# Patient Record
Sex: Female | Born: 1945 | Race: White | Hispanic: No | Smoking: Current every day smoker
Health system: Southern US, Community
[De-identification: ages and names within clinical notes are randomized; demographics above are authoritative.]

## PROBLEM LIST (undated history)

## (undated) DIAGNOSIS — I495 Sick sinus syndrome: Secondary | ICD-10-CM

## (undated) DIAGNOSIS — J449 Chronic obstructive pulmonary disease, unspecified: Secondary | ICD-10-CM

## (undated) DIAGNOSIS — G709 Myoneural disorder, unspecified: Secondary | ICD-10-CM

## (undated) DIAGNOSIS — K219 Gastro-esophageal reflux disease without esophagitis: Secondary | ICD-10-CM

## (undated) DIAGNOSIS — M5136 Other intervertebral disc degeneration, lumbar region: Secondary | ICD-10-CM

## (undated) DIAGNOSIS — Z720 Tobacco use: Secondary | ICD-10-CM

## (undated) DIAGNOSIS — E119 Type 2 diabetes mellitus without complications: Secondary | ICD-10-CM

## (undated) DIAGNOSIS — E785 Hyperlipidemia, unspecified: Secondary | ICD-10-CM

## (undated) DIAGNOSIS — Z9981 Dependence on supplemental oxygen: Secondary | ICD-10-CM

## (undated) DIAGNOSIS — G4730 Sleep apnea, unspecified: Secondary | ICD-10-CM

## (undated) DIAGNOSIS — I739 Peripheral vascular disease, unspecified: Secondary | ICD-10-CM

## (undated) DIAGNOSIS — Z950 Presence of cardiac pacemaker: Secondary | ICD-10-CM

## (undated) DIAGNOSIS — M51369 Other intervertebral disc degeneration, lumbar region without mention of lumbar back pain or lower extremity pain: Secondary | ICD-10-CM

## (undated) HISTORY — PX: TUBAL LIGATION: SHX77

## (undated) HISTORY — DX: Tobacco use: Z72.0

## (undated) HISTORY — PX: TONSILLECTOMY: SUR1361

## (undated) HISTORY — DX: Type 2 diabetes mellitus without complications: E11.9

## (undated) HISTORY — DX: Presence of cardiac pacemaker: Z95.0

## (undated) HISTORY — DX: Sick sinus syndrome: I49.5

## (undated) HISTORY — PX: CHOLECYSTECTOMY: SHX55

## (undated) HISTORY — PX: ABDOMINAL HYSTERECTOMY: SHX81

## (undated) HISTORY — DX: Hyperlipidemia, unspecified: E78.5

## (undated) HISTORY — PX: INSERT / REPLACE / REMOVE PACEMAKER: SUR710

---

## 2000-11-27 ENCOUNTER — Ambulatory Visit (HOSPITAL_COMMUNITY): Admission: RE | Admit: 2000-11-27 | Discharge: 2000-11-27 | Payer: Self-pay | Admitting: Family Medicine

## 2000-11-27 ENCOUNTER — Encounter: Payer: Self-pay | Admitting: Family Medicine

## 2001-09-04 ENCOUNTER — Encounter: Payer: Self-pay | Admitting: Family Medicine

## 2001-09-04 ENCOUNTER — Ambulatory Visit (HOSPITAL_COMMUNITY): Admission: RE | Admit: 2001-09-04 | Discharge: 2001-09-04 | Payer: Self-pay | Admitting: Family Medicine

## 2002-03-13 ENCOUNTER — Encounter: Payer: Self-pay | Admitting: Family Medicine

## 2002-03-13 ENCOUNTER — Ambulatory Visit (HOSPITAL_COMMUNITY): Admission: RE | Admit: 2002-03-13 | Discharge: 2002-03-13 | Payer: Self-pay | Admitting: Family Medicine

## 2002-04-12 ENCOUNTER — Ambulatory Visit (HOSPITAL_COMMUNITY): Admission: RE | Admit: 2002-04-12 | Discharge: 2002-04-12 | Payer: Self-pay | Admitting: Internal Medicine

## 2002-04-13 HISTORY — PX: COLONOSCOPY: SHX174

## 2002-04-13 HISTORY — PX: ESOPHAGOGASTRODUODENOSCOPY: SHX1529

## 2002-04-16 ENCOUNTER — Encounter (HOSPITAL_COMMUNITY): Admission: RE | Admit: 2002-04-16 | Discharge: 2002-05-16 | Payer: Self-pay | Admitting: *Deleted

## 2002-04-16 ENCOUNTER — Encounter: Payer: Self-pay | Admitting: *Deleted

## 2003-02-10 ENCOUNTER — Ambulatory Visit (HOSPITAL_COMMUNITY): Admission: RE | Admit: 2003-02-10 | Discharge: 2003-02-10 | Payer: Self-pay | Admitting: Family Medicine

## 2003-02-24 ENCOUNTER — Ambulatory Visit: Admission: RE | Admit: 2003-02-24 | Discharge: 2003-02-24 | Payer: Self-pay | Admitting: Family Medicine

## 2004-10-01 ENCOUNTER — Ambulatory Visit (HOSPITAL_COMMUNITY): Admission: RE | Admit: 2004-10-01 | Discharge: 2004-10-01 | Payer: Self-pay | Admitting: Family Medicine

## 2005-03-22 ENCOUNTER — Ambulatory Visit (HOSPITAL_COMMUNITY): Admission: RE | Admit: 2005-03-22 | Discharge: 2005-03-22 | Payer: Self-pay | Admitting: Pediatrics

## 2005-03-29 ENCOUNTER — Ambulatory Visit (HOSPITAL_COMMUNITY): Admission: RE | Admit: 2005-03-29 | Discharge: 2005-03-30 | Payer: Self-pay | Admitting: *Deleted

## 2006-01-17 ENCOUNTER — Ambulatory Visit (HOSPITAL_COMMUNITY): Admission: RE | Admit: 2006-01-17 | Discharge: 2006-01-17 | Payer: Self-pay | Admitting: Family Medicine

## 2007-06-15 ENCOUNTER — Ambulatory Visit (HOSPITAL_COMMUNITY): Admission: RE | Admit: 2007-06-15 | Discharge: 2007-06-15 | Payer: Self-pay | Admitting: Family Medicine

## 2007-08-01 ENCOUNTER — Ambulatory Visit (HOSPITAL_COMMUNITY): Admission: RE | Admit: 2007-08-01 | Discharge: 2007-08-01 | Payer: Self-pay | Admitting: Family Medicine

## 2007-11-20 ENCOUNTER — Ambulatory Visit (HOSPITAL_COMMUNITY): Admission: RE | Admit: 2007-11-20 | Discharge: 2007-11-20 | Payer: Self-pay | Admitting: Family Medicine

## 2008-08-07 ENCOUNTER — Emergency Department (HOSPITAL_COMMUNITY): Admission: EM | Admit: 2008-08-07 | Discharge: 2008-08-07 | Payer: Self-pay | Admitting: Emergency Medicine

## 2009-01-19 ENCOUNTER — Ambulatory Visit (HOSPITAL_COMMUNITY): Admission: RE | Admit: 2009-01-19 | Discharge: 2009-01-19 | Payer: Self-pay | Admitting: Family Medicine

## 2009-04-08 ENCOUNTER — Emergency Department (HOSPITAL_COMMUNITY): Admission: EM | Admit: 2009-04-08 | Discharge: 2009-04-08 | Payer: Self-pay | Admitting: Emergency Medicine

## 2009-04-17 ENCOUNTER — Ambulatory Visit (HOSPITAL_COMMUNITY): Admission: RE | Admit: 2009-04-17 | Discharge: 2009-04-17 | Payer: Self-pay | Admitting: Family Medicine

## 2009-04-27 ENCOUNTER — Ambulatory Visit (HOSPITAL_COMMUNITY): Admission: RE | Admit: 2009-04-27 | Discharge: 2009-04-27 | Payer: Self-pay | Admitting: Family Medicine

## 2009-04-28 ENCOUNTER — Ambulatory Visit (HOSPITAL_COMMUNITY): Admission: RE | Admit: 2009-04-28 | Discharge: 2009-04-28 | Payer: Self-pay | Admitting: Family Medicine

## 2009-04-29 ENCOUNTER — Ambulatory Visit (HOSPITAL_COMMUNITY): Admission: RE | Admit: 2009-04-29 | Discharge: 2009-04-29 | Payer: Self-pay | Admitting: Family Medicine

## 2009-05-06 ENCOUNTER — Encounter (HOSPITAL_COMMUNITY): Admission: RE | Admit: 2009-05-06 | Discharge: 2009-06-05 | Payer: Self-pay | Admitting: Family Medicine

## 2009-06-05 ENCOUNTER — Ambulatory Visit (HOSPITAL_COMMUNITY): Admission: RE | Admit: 2009-06-05 | Discharge: 2009-06-05 | Payer: Self-pay | Admitting: Neurological Surgery

## 2009-11-01 ENCOUNTER — Emergency Department (HOSPITAL_COMMUNITY): Admission: EM | Admit: 2009-11-01 | Discharge: 2009-11-01 | Payer: Self-pay | Admitting: Emergency Medicine

## 2010-04-25 ENCOUNTER — Encounter: Payer: Self-pay | Admitting: Internal Medicine

## 2010-05-20 HISTORY — PX: NM MYOCAR PERF WALL MOTION: HXRAD629

## 2010-05-20 HISTORY — PX: TRANSTHORACIC ECHOCARDIOGRAM: SHX275

## 2010-06-16 ENCOUNTER — Other Ambulatory Visit (HOSPITAL_COMMUNITY): Payer: Self-pay | Admitting: Family Medicine

## 2010-06-16 DIAGNOSIS — Z139 Encounter for screening, unspecified: Secondary | ICD-10-CM

## 2010-06-19 LAB — CBC
Hemoglobin: 14.1 g/dL (ref 12.0–15.0)
MCH: 32.8 pg (ref 26.0–34.0)
Platelets: 266 10*3/uL (ref 150–400)
RBC: 4.3 MIL/uL (ref 3.87–5.11)
WBC: 9.3 10*3/uL (ref 4.0–10.5)

## 2010-06-19 LAB — BASIC METABOLIC PANEL
CO2: 23 mEq/L (ref 19–32)
Calcium: 9.7 mg/dL (ref 8.4–10.5)
Creatinine, Ser: 0.88 mg/dL (ref 0.4–1.2)
GFR calc Af Amer: >60 mL/min (ref >60)
GFR calc non Af Amer: >60 mL/min (ref >60)
Sodium: 137 mEq/L (ref 135–145)

## 2010-06-19 LAB — DIFFERENTIAL
Basophils Relative: 1 % (ref 0–1)
Eosinophils Absolute: 0.2 10*3/uL (ref 0.0–0.7)
Lymphs Abs: 2.4 10*3/uL (ref 0.7–4.0)
Neutro Abs: 5.9 10*3/uL (ref 1.7–7.7)
Neutrophils Relative %: 64 % (ref 43–77)

## 2010-06-20 LAB — URINALYSIS, ROUTINE W REFLEX MICROSCOPIC
Bilirubin Urine: NEGATIVE
Glucose, UA: NEGATIVE mg/dL
Hgb urine dipstick: NEGATIVE
Ketones, ur: NEGATIVE mg/dL
Leukocytes, UA: NEGATIVE
Nitrite: POSITIVE — AB
Protein, ur: NEGATIVE mg/dL
Specific Gravity, Urine: 1.02 (ref 1.005–1.030)
Urobilinogen, UA: 0.2 mg/dL (ref 0.0–1.0)
pH: 5.5 (ref 5.0–8.0)

## 2010-06-20 LAB — URINE CULTURE: Colony Count: >=100000

## 2010-06-20 LAB — URINE MICROSCOPIC-ADD ON

## 2010-06-22 ENCOUNTER — Ambulatory Visit (HOSPITAL_COMMUNITY): Payer: Medicare Other

## 2010-06-27 LAB — GLUCOSE, CAPILLARY: Glucose-Capillary: 113 mg/dL — ABNORMAL HIGH (ref 70–99)

## 2010-07-13 ENCOUNTER — Inpatient Hospital Stay (HOSPITAL_COMMUNITY)
Admission: AD | Admit: 2010-07-13 | Discharge: 2010-07-16 | DRG: 690 | Disposition: A | Discharge status: Home / Self Care | Payer: Medicare Other | Source: Ambulatory Visit | Attending: Family Medicine | Admitting: Family Medicine

## 2010-07-13 ENCOUNTER — Emergency Department (HOSPITAL_COMMUNITY): Payer: Medicare Other

## 2010-07-13 DIAGNOSIS — G7109 Other specified muscular dystrophies: Secondary | ICD-10-CM | POA: Diagnosis present

## 2010-07-13 DIAGNOSIS — E860 Dehydration: Secondary | ICD-10-CM | POA: Diagnosis present

## 2010-07-13 DIAGNOSIS — R918 Other nonspecific abnormal finding of lung field: Secondary | ICD-10-CM | POA: Diagnosis present

## 2010-07-13 DIAGNOSIS — G8929 Other chronic pain: Secondary | ICD-10-CM | POA: Diagnosis present

## 2010-07-13 DIAGNOSIS — M545 Low back pain, unspecified: Secondary | ICD-10-CM | POA: Diagnosis present

## 2010-07-13 DIAGNOSIS — F05 Delirium due to known physiological condition: Secondary | ICD-10-CM | POA: Diagnosis present

## 2010-07-13 DIAGNOSIS — N390 Urinary tract infection, site not specified: Principal | ICD-10-CM | POA: Diagnosis present

## 2010-07-13 DIAGNOSIS — E875 Hyperkalemia: Secondary | ICD-10-CM | POA: Diagnosis present

## 2010-07-13 DIAGNOSIS — A498 Other bacterial infections of unspecified site: Secondary | ICD-10-CM | POA: Diagnosis present

## 2010-07-13 LAB — POCT CARDIAC MARKERS
CKMB, poc: 2.2 ng/mL (ref 1.0–8.0)
Myoglobin, poc: 112 ng/mL (ref 12–200)
Myoglobin, poc: 170 ng/mL (ref 12–200)

## 2010-07-13 LAB — CBC
HCT: 37.2 % (ref 36.0–46.0)
HCT: 44.3 % (ref 36.0–46.0)
Hemoglobin: 13.1 g/dL (ref 12.0–15.0)
Hemoglobin: 14.7 g/dL (ref 12.0–15.0)
MCHC: 33.2 g/dL (ref 30.0–36.0)
MCHC: 35.3 g/dL (ref 30.0–36.0)
MCV: 95.2 fL (ref 78.0–100.0)
RBC: 3.91 MIL/uL (ref 3.87–5.11)
RBC: 4.58 MIL/uL (ref 3.87–5.11)
WBC: 8.1 10*3/uL (ref 4.0–10.5)

## 2010-07-13 LAB — DIFFERENTIAL
Basophils Absolute: 0 10*3/uL (ref 0.0–0.1)
Basophils Relative: 1 % (ref 0–1)
Eosinophils Absolute: 0.3 10*3/uL (ref 0.0–0.7)
Eosinophils Relative: 4 % (ref 0–5)
Lymphocytes Relative: 13 % (ref 12–46)
Lymphs Abs: 3.6 10*3/uL (ref 0.7–4.0)
Monocytes Absolute: 0.5 10*3/uL (ref 0.1–1.0)
Monocytes Absolute: 0.7 10*3/uL (ref 0.1–1.0)
Monocytes Relative: 6 % (ref 3–12)
Monocytes Relative: 6 % (ref 3–12)
Neutro Abs: 9.2 10*3/uL — ABNORMAL HIGH (ref 1.7–7.7)
Neutrophils Relative %: 45 % (ref 43–77)
Neutrophils Relative %: 80 % — ABNORMAL HIGH (ref 43–77)

## 2010-07-13 LAB — URINALYSIS, ROUTINE W REFLEX MICROSCOPIC
Bilirubin Urine: NEGATIVE
Hgb urine dipstick: NEGATIVE
Nitrite: POSITIVE — AB
Urobilinogen, UA: 0.2 mg/dL (ref 0.0–1.0)

## 2010-07-13 LAB — COMPREHENSIVE METABOLIC PANEL
ALT: 55 U/L — ABNORMAL HIGH (ref 0–35)
AST: 55 U/L — ABNORMAL HIGH (ref 0–37)
CO2: 26 mEq/L (ref 19–32)
Calcium: 9.5 mg/dL (ref 8.4–10.5)
Creatinine, Ser: 1.14 mg/dL (ref 0.4–1.2)
GFR calc Af Amer: 58 mL/min — ABNORMAL LOW (ref >60)
GFR calc non Af Amer: 48 mL/min — ABNORMAL LOW (ref >60)
Sodium: 134 mEq/L — ABNORMAL LOW (ref 135–145)
Total Protein: 6.7 g/dL (ref 6.0–8.3)

## 2010-07-13 LAB — D-DIMER, QUANTITATIVE: D-Dimer, Quant: 0.47 ug/mL-FEU (ref 0.00–0.48)

## 2010-07-13 LAB — LIPASE, BLOOD: Lipase: 17 U/L (ref 11–59)

## 2010-07-13 LAB — BASIC METABOLIC PANEL
CO2: 25 mEq/L (ref 19–32)
Chloride: 106 mEq/L (ref 96–112)
Creatinine, Ser: 1.05 mg/dL (ref 0.4–1.2)
GFR calc Af Amer: >60 mL/min (ref >60)
Potassium: 3.8 mEq/L (ref 3.5–5.1)

## 2010-07-13 LAB — URINE MICROSCOPIC-ADD ON

## 2010-07-13 LAB — GLUCOSE, CAPILLARY: Glucose-Capillary: 141 mg/dL — ABNORMAL HIGH (ref 70–99)

## 2010-07-13 MED ORDER — IOHEXOL 300 MG/ML  SOLN
100.0000 mL | Freq: Once | INTRAMUSCULAR | Status: AC | PRN
  Administered 2010-07-13: 100 mL via INTRAVENOUS

## 2010-07-14 LAB — GLUCOSE, CAPILLARY
Glucose-Capillary: 165 mg/dL — ABNORMAL HIGH (ref 70–99)
Glucose-Capillary: 114 mg/dL — ABNORMAL HIGH (ref 70–99)
Glucose-Capillary: 104 mg/dL — ABNORMAL HIGH (ref 70–99)

## 2010-07-14 LAB — CBC
HCT: 47.5 % — ABNORMAL HIGH (ref 36.0–46.0)
Hemoglobin: 15.6 g/dL — ABNORMAL HIGH (ref 12.0–15.0)
RBC: 4.93 MIL/uL (ref 3.87–5.11)

## 2010-07-14 LAB — BASIC METABOLIC PANEL
CO2: 26 mEq/L (ref 19–32)
Chloride: 99 mEq/L (ref 96–112)
GFR calc Af Amer: >60 mL/min (ref >60)
Glucose, Bld: 117 mg/dL — ABNORMAL HIGH (ref 70–99)
Sodium: 135 mEq/L (ref 135–145)

## 2010-07-14 LAB — DIFFERENTIAL
Basophils Absolute: 0 10*3/uL (ref 0.0–0.1)
Basophils Relative: 0 % (ref 0–1)
Lymphocytes Relative: 16 % (ref 12–46)
Monocytes Absolute: 1.2 10*3/uL — ABNORMAL HIGH (ref 0.1–1.0)
Monocytes Relative: 12 % (ref 3–12)
Neutro Abs: 7.4 10*3/uL (ref 1.7–7.7)
Neutrophils Relative %: 72 % (ref 43–77)

## 2010-07-14 NOTE — H&P (Signed)
NAMEKRYSTL, WICKWARE                ACCOUNT NO.:  [id number]  MEDICAL RECORD NO.:  [id number]           PATIENT TYPE:  I  LOCATION:  A334                          FACILITY:  [facility]  PHYSICIAN:  Angeliki Mates L. Juanetta Gosling, M.D.DATE OF BIRTH:  11/11/1945  DATE OF ADMISSION:  07/13/2010 DATE OF DISCHARGE:  LH                             HISTORY & PHYSICAL   Ms. Vasudevan is a 65 year old patient of Dr. Michelle Nasuti.  She came to the emergency room because of nausea and vomiting.  She was also found to be hyperkalemic.  She says that she has been having trouble with the abdominal discomfort, nausea, and vomiting that started yesterday.  She also had some weakness and chills, vomited several times.  She has not had any diarrhea and in fact has not had any bowel movements at all. She says that it is in the right side of her abdomen.  It has not been radiating and it has been a 10/10.  She feels very weak.  She is not sure if she has had any fever or not. PAST MEDICAL HISTORY:  Positive for muscular dystrophy, diabetes.  She has a pacemaker.  She has GERD.  She has hyperlipidemia.  She has a history of hysterectomy in the past.  She has also had a cholecystectomy.  SOCIAL HISTORY:  She smokes about a pack of cigarettes daily.  She does not use any alcohol.  She does not use any illicit drugs.  She has no known drug allergies.  MEDICATIONS: 1. Percocet 5/325 four times a day as needed for pain. 2. Xanax 1 mg as needed for anxiety. 3. Aspirin. 4. Fish oil. 5. Metformin. 6. Omeprazole. 7. Simvastatin.  We do not have the doses of those.  FAMILY HISTORY:  Shows her mother died of non-Hodgkin lymphoma in her 40s.  Father had a neuroblastoma.  There is a significant history of diabetes and hypertension.  REVIEW OF SYSTEMS:  Except as mentioned is negative.  PHYSICAL EXAMINATION:  VITAL SIGNS:  Shows her blood pressure initially 98/64, pulse was 72, respirations 16, temperature is 97.5.  Later  after IV fluids, blood pressure 137/70, heart rate was 71, respirations 16. GENERAL:  She says she is feeling better.  She is not having nausea now. HEENT:  Shows that her mucous membranes are dry. NECK:  Supple without masses. CHEST:  Shows some rhonchi bilaterally. HEART:  Regular without murmur, gallop or rub. ABDOMEN:  Soft with minimal tenderness diffusely. EXTREMITIES:  Showed no edema. CNS:  Grossly intact.  LABORATORY WORK:  Blood sugar was 141.  Urinalysis is very positive with positive nitrites.  Cath urine shows many bacteria,7-10 white cells. Her white count 11,400, hemoglobin is 14.7, platelets of 187.  Her sodium is 134, potassium was 6.7, later 6.1, BUN of 20, creatinine 1.14. She has received Kayexalate.  She had a CT abdomen and pelvis that did not show anything acute, and no diverticulitis.  ASSESSMENT AND PLAN:  She has abdominal discomfort, perhaps related to her urinary tract infection.  She has had nausea and vomiting.  She has an elevated potassium.  I am not  sure of the cause of that.  Liver functions are slightly elevated as well.  Liver looked okay on her CT. My plan then is to give her IV fluids, medications for nausea, repeat her labs in the morning, and put her on IV Cipro for her urinary tract infection.  Dr. Sudie Bailey will assume her care in the morning.     Camay Pedigo L. Juanetta Gosling, M.D.     ELH/MEDQ  D:  07/13/2010  T:  07/14/2010  Job:  [id number]  Electronically Signed by Kari Baars M.D. on 07/14/2010 12:46:58 PM

## 2010-07-15 LAB — GLUCOSE, CAPILLARY
Glucose-Capillary: 106 mg/dL — ABNORMAL HIGH (ref 70–99)
Glucose-Capillary: 132 mg/dL — ABNORMAL HIGH (ref 70–99)
Glucose-Capillary: 100 mg/dL — ABNORMAL HIGH (ref 70–99)
Glucose-Capillary: 112 mg/dL — ABNORMAL HIGH (ref 70–99)

## 2010-07-16 LAB — BASIC METABOLIC PANEL
CO2: 27 mEq/L (ref 19–32)
Calcium: 8.5 mg/dL (ref 8.4–10.5)
Chloride: 101 mEq/L (ref 96–112)
GFR calc Af Amer: >60 mL/min (ref >60)
Glucose, Bld: 90 mg/dL (ref 70–99)
Potassium: 3.6 mEq/L (ref 3.5–5.1)
Sodium: 134 mEq/L — ABNORMAL LOW (ref 135–145)

## 2010-07-16 LAB — DIFFERENTIAL
Basophils Absolute: 0 10*3/uL (ref 0.0–0.1)
Basophils Relative: 1 % (ref 0–1)
Lymphocytes Relative: 38 % (ref 12–46)
Monocytes Absolute: 0.7 10*3/uL (ref 0.1–1.0)
Neutro Abs: 2.9 10*3/uL (ref 1.7–7.7)
Neutrophils Relative %: 47 % (ref 43–77)

## 2010-07-16 LAB — CBC
HCT: 34.7 % — ABNORMAL LOW (ref 36.0–46.0)
Hemoglobin: 11.5 g/dL — ABNORMAL LOW (ref 12.0–15.0)
MCHC: 33.1 g/dL (ref 30.0–36.0)
RBC: 3.61 MIL/uL — ABNORMAL LOW (ref 3.87–5.11)

## 2010-07-16 LAB — URINE CULTURE: Colony Count: >=100000

## 2010-07-26 ENCOUNTER — Inpatient Hospital Stay (HOSPITAL_COMMUNITY)
Admission: AD | Admit: 2010-07-26 | Discharge: 2010-07-29 | DRG: 191 | Disposition: A | Discharge status: Home / Self Care | Payer: Medicare Other | Source: Ambulatory Visit | Attending: Family Medicine | Admitting: Family Medicine

## 2010-07-26 ENCOUNTER — Emergency Department (HOSPITAL_COMMUNITY): Payer: Medicare Other

## 2010-07-26 DIAGNOSIS — J678 Hypersensitivity pneumonitis due to other organic dusts: Secondary | ICD-10-CM | POA: Diagnosis present

## 2010-07-26 DIAGNOSIS — F411 Generalized anxiety disorder: Secondary | ICD-10-CM | POA: Diagnosis present

## 2010-07-26 DIAGNOSIS — E7800 Pure hypercholesterolemia, unspecified: Secondary | ICD-10-CM | POA: Diagnosis present

## 2010-07-26 DIAGNOSIS — E119 Type 2 diabetes mellitus without complications: Secondary | ICD-10-CM | POA: Diagnosis present

## 2010-07-26 DIAGNOSIS — G8929 Other chronic pain: Secondary | ICD-10-CM | POA: Diagnosis present

## 2010-07-26 DIAGNOSIS — M545 Low back pain, unspecified: Secondary | ICD-10-CM | POA: Diagnosis present

## 2010-07-26 DIAGNOSIS — J309 Allergic rhinitis, unspecified: Secondary | ICD-10-CM | POA: Diagnosis present

## 2010-07-26 DIAGNOSIS — J441 Chronic obstructive pulmonary disease with (acute) exacerbation: Principal | ICD-10-CM | POA: Diagnosis present

## 2010-07-26 DIAGNOSIS — G7109 Other specified muscular dystrophies: Secondary | ICD-10-CM | POA: Diagnosis present

## 2010-07-26 LAB — DIFFERENTIAL
Basophils Relative: 1 % (ref 0–1)
Lymphocytes Relative: 25 % (ref 12–46)
Lymphs Abs: 2.6 10*3/uL (ref 0.7–4.0)
Monocytes Absolute: 0.9 10*3/uL (ref 0.1–1.0)
Monocytes Relative: 8 % (ref 3–12)
Neutro Abs: 4.8 10*3/uL (ref 1.7–7.7)
Neutrophils Relative %: 45 % (ref 43–77)

## 2010-07-26 LAB — BLOOD GAS, ARTERIAL
Acid-base deficit: 0.2 mmol/L (ref 0.0–2.0)
O2 Content: 3 L/min
O2 Saturation: 92.3 %
pCO2 arterial: 45.4 mmHg — ABNORMAL HIGH (ref 35.0–45.0)
pO2, Arterial: 68.1 mmHg — ABNORMAL LOW (ref 80.0–100.0)

## 2010-07-26 LAB — POCT CARDIAC MARKERS: Troponin i, poc: <0.05 ng/mL (ref 0.00–0.09)

## 2010-07-26 LAB — CBC
HCT: 40.3 % (ref 36.0–46.0)
Hemoglobin: 13.2 g/dL (ref 12.0–15.0)
MCH: 31.7 pg (ref 26.0–34.0)
MCHC: 32.8 g/dL (ref 30.0–36.0)
RBC: 4.16 MIL/uL (ref 3.87–5.11)

## 2010-07-27 LAB — BASIC METABOLIC PANEL
CO2: 25 mEq/L (ref 19–32)
Calcium: 9.9 mg/dL (ref 8.4–10.5)
Creatinine, Ser: 1 mg/dL (ref 0.4–1.2)
GFR calc Af Amer: >60 mL/min (ref >60)
GFR calc non Af Amer: 56 mL/min — ABNORMAL LOW (ref >60)
Glucose, Bld: 103 mg/dL — ABNORMAL HIGH (ref 70–99)

## 2010-07-28 LAB — GLUCOSE, CAPILLARY
Glucose-Capillary: 173 mg/dL — ABNORMAL HIGH (ref 70–99)
Glucose-Capillary: 170 mg/dL — ABNORMAL HIGH (ref 70–99)

## 2010-07-29 LAB — GLUCOSE, CAPILLARY: Glucose-Capillary: 134 mg/dL — ABNORMAL HIGH (ref 70–99)

## 2010-08-19 NOTE — Discharge Summary (Signed)
[name], [name]                ACCOUNT NO.:  [id number]  MEDICAL RECORD NO.:  [id number]           PATIENT TYPE:  I  LOCATION:  A334                          FACILITY:  [facility]  PHYSICIAN:  Mila Homer. Sudie Bailey, M.D.DATE OF BIRTH:  1945-05-28  DATE OF ADMISSION:  07/13/2010 DATE OF DISCHARGE:  04/13/2012LH                              DISCHARGE SUMMARY   HISTORY:  This 65 year old woman is admitted to the hospital with what turned out to be E. coli urinary tract infection.  She had a fairly benign hospitalization extending from July 13, 2010 to July 16, 2010. Vital signs remained stable.  LABORATORY DATA:  Her white cell count on admission was 11,400 of which 80% neutrophils, 13 lymphs, hemoglobin was 14.7, platelet count 187,000. Recheck white cell count the following day was 10,300, hemoglobin 15.6 and two days later the white cell count 6100, hemoglobin 11.5.  She had normal diff on both of these.  Her admission CMP showed potassium 6.7, glucose 125, BUN 20, creatinine 1.14, mildly elevated SGOT and SGPT of 55 and 55 respectively.  Recheck potassium level 10 hours later was 6.1 and then the following morning 4.8 and the day of discharge 3.6. Admission UA showed specific gravity greater than 1030, positive nitrite and under the microscope there are hyaline casts, 7-10 WBCs per HPF and many bacteria.  The urine culture grew out greater than 100,000 E. coli sensitive to everything tested except for ciprofloxacin and levofloxacin.  DIAGNOSTICS:  CT of the abdomen and pelvis with contrast shows trace nonspecific free fluid in the pelvis and mild patchy right lower lobe airspace opacity felt probably from atelectasis.  As well, she had a small right renal cyst, scattered coronary artery calcifications, mild degenerative changes of lower lumbar spine and a 5 mm nodule within the peripheral right lower lung lobe.  HOSPITAL COURSE:  She was admitted to the hospital and put on  Rocephin 1 gm IV q.24 h., Protonix 40 mg IV daily, sliding-scale insulin with sensitive insulin coverage, Lovenox 40 mg subcutaneous daily, Percocet 325 q.6 h. for pain and IV 125 mL an hour.  She still fairly sick the following day, but 2 days later she is improving.  She was put on NicoDerm 12 mg patch q.24 h.  By the third day, IV was discontinued. She was switched to Hep-Lock.  She is up in a chair, moving around the room and put on a regular diet.  By her fourth and last day, she is much improved and ready for discharge from the hospital.  She is still somewhat weak and told me she did not remember anything of the admission.  FINAL DISCHARGE DIAGNOSES: 1. Escherichia coli urinary tract infection. 2. Confusion of multifactorial etiology. 3. Hyperkalemia, question etiology. 4. Muscular dystrophy. 5. Abnormal lung CT. 6. Chronic low back pain.  DISCHARGE MEDICATIONS:  She is discharged home on her admission meds including: 1. Percocet 5/325 q.i.d. for pain. 2. Alprazolam 1 mg t.i.d. 3. Aspirin daily. 4. Omeprazole 20 mg daily. 5. Adderall 20 mg b.i.d. for her muscular dystrophy weakness. 6. Now Bactrim DS b.i.d. for a 10-day course (  20 with no refills).  FOLLOW UP:  Follow up within the office in 5 days or before then if needed.  She understands that we will recheck a CT of the lung within 6 months given the nodule noted in the peripheral right lower lobe lung lobe.     Mila Homer. Sudie Bailey, M.D.     SDK/MEDQ  D:  07/16/2010  T:  07/16/2010  Job:  [id number]  Electronically Signed by John Giovanni M.D. on 08/19/2010 07:09:30 AM

## 2010-08-19 NOTE — H&P (Signed)
[name], [name]                ACCOUNT NO.:  [id number]  MEDICAL RECORD NO.:  [id number]           PATIENT TYPE:  I  LOCATION:  A306                          FACILITY:  [facility]  PHYSICIAN:  Mila Homer. Sudie Bailey, M.D.DATE OF BIRTH:  [birth year]  DATE OF ADMISSION:  07/26/2010 DATE OF DISCHARGE:  04/26/2012LH                             HISTORY & PHYSICAL   HISTORY OF PRESENT ILLNESS:  This 65 year old woman presented to the [facility] Emergency Department with shortness of breath and cough productive of gray sputum which had started the day before admission.  She had been recently admitted to the hospital with an E coli urinary tract infection, was discharged and apparently doing well.  PAST MEDICAL HISTORY:  She has a long history of COPD and tobacco use. She also has muscular dystrophy.  She has a pacemaker.  SOCIAL HISTORY:  She lives at home by herself.  Her husband is currently in a nursing home in [place] due to multiple medical problems.  MEDICATIONS:  The patient was discharged home on Percocet 5/325 q.i.d. for pain, alprazolam 1 mg t.i.d., enteric coated aspirin 81 mg daily, omeprazole 20 mg daily, Adderall 20 mg b.i.d. which she uses for the weakness engendered by her muscular dystrophy, and Bactrim DS b.i.d. for a 10-day course of the E-coli urinary tract infection.  PHYSICAL EXAMINATION:  GENERAL:  Admission exam showed a somewhat dyspneic elderly woman. VITAL SIGNS:  Her vital signs in the emergency room included temperature 97.6, blood pressure 136/82, pulse 74, respiratory 30. HEENT:  At the time I saw her color was good.  She is moving air well. Her speech was normal, sentence structure intact.  Her mucous membranes were moist.  Skin turgor normal.  Her pharynx is normal.  There are no anterior or cervical nodes enlarged.  She had no axillary supraclavicular adenopathy. LUNGS:  Decreased breath sounds throughout with some  rhonchi posteriorly. CARDIOVASCULAR:  Heart had a regular rhythm, rate of about 90 and heart sounds were faint. ABDOMEN:  Soft without organomegaly or mass or tenderness. EXTREMITIES:  She had no edema of the ankles.  LABORATORY DATA:  On admission her blood gas showed pH 7.35, pCO2 of 45, pO2 68.  O2 sat was 92% on 3 liters.  Her white cell count was 10,600 of which 45% neutrophils, 25 lymphs, 8 monos and 21 eosinophils. Hemoglobin was 13.2 and BMP showed sodium of 134, glucose of 103. Cardiac markers were normal.  Chest x-ray showed no acute changes.  ADMISSION DIAGNOSES: 1. Chronic obstructive pulmonary disease exacerbation, probably     secondary to pollen allergens. 2. Tobacco use disorder. 3. Muscular dystrophy. 4. 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 5. Recent E coli urinary tract infection. 6. Allergic rhinitis.  Current she is on IV fluids, oxygen and breathing treatments.  Since most of this seems to be allergic, I am adding steroids.  She cannot tolerate prednisone so we used Decadron 4 mg b.i.d..  Will continue with the other treatments.     Mila Homer. Sudie Bailey, M.D.     SDK/MEDQ  D:  07/29/2010  T:  07/29/2010  Job:  [id number]  Electronically Signed by John Giovanni M.D. on 08/19/2010 07:10:23 AM

## 2010-08-19 NOTE — Group Therapy Note (Signed)
  [name], [name]                ACCOUNT NO.:  [id number]  MEDICAL RECORD NO.:  [id number]           PATIENT TYPE:  I  LOCATION:  A334                          FACILITY:  [facility]  PHYSICIAN:  Mila Homer. Sudie Bailey, M.D.DATE OF BIRTH:  [birth year]  DATE OF PROCEDURE: DATE OF DISCHARGE:                                PROGRESS NOTE   SUBJECTIVE:  She feels better today.  She tells me yesterday she got up to go to the bathroom by herself and was very weak but she feels better today.  She is still on liquids and is ready for solid food.  OBJECTIVE:  Temperature is 98.2.  Pulse 74.  Respiratory rate 17.  Blood pressure 101/68.  She is semirecumbent in bed.  When I first came in, she was snoozing but she woke right up.  Alert and oriented. LUNGS:  Clear throughout and she is moving air well. HEART:  Regular rhythm.  Rate of about 80. ABDOMEN:  Soft without organomegaly or mass but she does have mild tenderness upper abdomen more than lower which she ascribes to coughing. There is no edema of the ankles.  O2 sats 96% 3 L and glucose of 106.  ASSESSMENT: 1. Presumptive urinary tract infection. 2. Confusion of multifactorial etiology. 3. Hyperkalemia, question etiology. 4. Muscular dystrophy.  PLAN:  Reducing IV today, switch to Hep-Lock, and we will get her up in a chair.  She is to start a regular diet and we will repeat the CBC, BMP in the morning.  If everything is doing well and she is stronger and walking on her own, she may be able to be discharged at that time.     Mila Homer. Sudie Bailey, M.D.     SDK/MEDQ  D:  07/15/2010  T:  07/15/2010  Job:  [id number]  Electronically Signed by John Giovanni M.D. on 08/19/2010 07:09:56 AM

## 2010-08-19 NOTE — Group Therapy Note (Signed)
  [name], [name]                ACCOUNT NO.:  [id number]  MEDICAL RECORD NO.:  [id number]           PATIENT TYPE:  I  LOCATION:  A334                          FACILITY:  [facility]  PHYSICIAN:  Mila Homer. Sudie Bailey, M.D.DATE OF BIRTH:  1945/10/30  DATE OF PROCEDURE:  07/14/2010 DATE OF DISCHARGE:                                PROGRESS NOTE   SUBJECTIVE:  The patient admitted to hospital last night with what appeared to be a urinary tract infection and hypokalemia.  She had been feeling her usual self until several days before, then developed frequency every half hour starting the day prior to admission and nocturia every hour the night prior to admission.  She had no fever or chills but then had a major sweat the day of admission, after which she had her son bring her to the emergency room for evaluation.  She was having fairly severe lower abdominal pain, which is now much better.  She is feeling better at present.  Nursing tells me that she has been somewhat confused today.  When I could not see her immediately this morning the office called and she said it was fine that I come and visit her at her home.  She currently lives by herself.  Her husband is in a nursing home in [place].  She sees him once a week.  She is still smoking a pack a day of cigarettes.  OBJECTIVE:  VITAL SIGNS:  Today the temperature is 97.9, pulse 69, respiratory rate 18, blood pressure 127/69. GENERAL: She appears to be alert and oriented during the time I am talking to her. LUNGS:  Decreased breath sounds throughout but she is moving air well. HEART:  Regular rhythm, rate of about 80. ABDOMEN:  Soft without organomegaly, mass or tenderness.  There is no edema of the ankles.  ASSESSMENT: 1. Presumptive urinary tract infection. 2. Confusion, multifactorial etiology. 3. Hyperkalemia, question etiology. 4. Muscular dystrophy.  PLAN:  Continue with Cipro 400 mg IV q.12 hours and Rocephin 1 gram  IV daily.  She is on a NicoDerm patch and off the cigarettes.  A urine culture and sensitivity is pending.  I reviewed her CT of the abdomen which showed a 5-mm nodule in the peripheral right lower lung lobe.  She also has scattered coronary artery calcification, mild bibasilar atelectasis and mild patchy right lobe airspace which is felt to be probably atelectasis.     Mila Homer. Sudie Bailey, M.D.     SDK/MEDQ  D:  07/14/2010  T:  07/15/2010  Job:  [id number]  Electronically Signed by John Giovanni M.D. on 08/19/2010 07:09:39 AM

## 2010-08-19 NOTE — Group Therapy Note (Signed)
  [name], [name]                ACCOUNT NO.:  [id number]  MEDICAL RECORD NO.:  [id number]           PATIENT TYPE:  I  LOCATION:  A306                          FACILITY:  [facility]  PHYSICIAN:  Mila Homer. Sudie Bailey, M.D.DATE OF BIRTH:  1946/03/30  DATE OF PROCEDURE:  07/28/2010 DATE OF DISCHARGE:                                PROGRESS NOTE   SUBJECTIVE:  This patient feels much better today.  She said last night she had a lot of coughing and sneezing.  She does have some tenderness of her left naris but not the right naris.  She blew out a lot of thick green material from the nose last night but now, it is clear.  I started her on Decadron 4 mg b.i.d. yesterday.  OBJECTIVE:  VITAL SIGNS:  Temperature 98 degrees, pulse 70, respiratory rate 20, blood pressure 109/70. GENERAL:  She is in no acute distress.  She is sitting up in bed. LUNGS:  The lungs show decreased breath sounds throughout, but she is moving air well. HEART:  Her heart has a regular rhythm, rate of about 70.  Sugars have been 143 and 172.  Her O2 sat on room air is 94%.  D-dimer done yesterday was 0.7.  ASSESSMENT: 1. Chronic obstructive pulmonary disease exacerbation. 2. Allergic rhinitis, probably to pollen. 3. Muscular dystrophy. 4. Chronic low back pain.  PLAN:  Continue the Decadron 4 mg b.i.d.  I am adding doxycycline 100 mg b.i.d. due to the concern over a possible Staph infection of the left naris.  Will DC her IV, switch her to Hep-Lock, get her sitting up in a chair, ambulate with assistance and hopefully discharge home tomorrow if she continues to do this well.     Mila Homer. Sudie Bailey, M.D.     SDK/MEDQ  D:  07/28/2010  T:  07/28/2010  Job:  [id number]  Electronically Signed by John Giovanni M.D. on 08/19/2010 07:10:34 AM

## 2010-08-19 NOTE — Discharge Summary (Signed)
  [name], [name]                ACCOUNT NO.:  [id number]  MEDICAL RECORD NO.:  [id number]           PATIENT TYPE:  I  LOCATION:  A306                          FACILITY:  [facility]  PHYSICIAN:  Mila Homer. Sudie Bailey, M.D.DATE OF BIRTH:  10-01-1945  DATE OF ADMISSION:  07/26/2010 DATE OF DISCHARGE:  04/26/2012LH                              DISCHARGE SUMMARY   HISTORY OF PRESENT ILLNESS:  This 65 year old admitted to the hospital with COPD exacerbation.  She had a benign hospitalization extending from April 24 to July 29, 2010.  Vital signs remained stable.  Her admission white cell count was 10,600, of which 45% neutrophils, 25 lymphs and 8 monos and 21% eosinophils.  Her hemoglobin was 13.2, platelet count 274,000.  BMP showed sodium 134, glucose 103.  Her D- dimer was 0.70.  Cardiac markers normal and the ABG on 3 liters showed a pH 7.35, pCO2 45, pO2 of 68, bicarb 24.  On admission chest x-ray showed no acute findings.The patient was admitted to the hospital and started on IV normal saline 75 mL an hour, given DuoNeb's q.4 h while awake, simvastatin 40 mg daily, Percocet 10/325 q.i.d. p.r.n. back pain, aspirin 81 mg daily, O2 to keep her O2 sat greater than 90% and Lovenox 40 mg daily prophylactically.  The second day IV was decreased to 50 mL an hour and she is put on metformin 500 mg q.a.m., Decadron 4 mg b.i.d..  She did well on this regimen.  She is breathing easier.  By her second day her IV was discontinued and she was switched to Hep-Lock.  She is up in chair and put on doxycycline 100 mg b.i.d..  She is much improved by third day and ready for discharge home.  At that time we will arranged to have a home nebulizer.  Prescriptions were written for albuterol ampules and ipratropium ampules q.4 h and nebulizer with p.r.n. refills, Decadron 4 mg q.a.m. (15 with no refills) and doxycycline 100 mg b.i.d. (20 with no refills).  She is to continue her alprazolam 1 mg t.i.d.  p.r.n. anxiety, aspirin 81 mg daily, metformin 500 mg daily, oxycodone/APAP 10/325 q.i.d. for back pain, simvastatin 40 mg daily.  FINAL DISCHARGE DIAGNOSES: 1. Chronic obstructive pulmonary disease exacerbation. 2. Allergic rhinitis and pneumonitis secondary to pollen. 3. Muscular dystrophy. 4. Chronic low back pain. 5. Diabetes. 6. Hypercholesterolemia. 7. Anxiety.  Follow-up will be in the office within 4-5 days.     Mila Homer. Sudie Bailey, M.D.     SDK/MEDQ  D:  07/29/2010  T:  07/29/2010  Job:  [id number]  Electronically Signed by John Giovanni M.D. on 08/19/2010 07:10:13 AM

## 2010-08-20 NOTE — Consult Note (Signed)
NAME:  [name], [name]                          ACCOUNT NO.:  [id number]   MEDICAL RECORD NO.:  [id number]                   PATIENT TYPE:   LOCATION:                                       FACILITY:  [facility]   PHYSICIAN:  R. Roetta Sessions, M.D.              DATE OF BIRTH:  [birth year]   DATE OF CONSULTATION:  03/25/2002  DATE OF DISCHARGE:                                   CONSULTATION   OUTPATIENT GASTROINTESTINAL CONSULTATION:   REASON FOR CONSULTATION:  Left upper quadrant abdominal pain, possible  colonoscopy.   HISTORY OF PRESENT ILLNESS:  The patient is a 65 year old Caucasian female,  patient of Dr. Sudie Bailey, who presents today for further evaluation of left  upper quadrant abdominal pain.  She states that she has had this pain for  several months.  It is intermittent in nature but generally happens every  day.  She describes it as a burning-type pain which lasts for several  seconds to minutes at a time.  She says it is not severe.  She has no  nocturnal symptoms.  Nothing seems to make the pain better or worse.  She  also notes over the last 1 year that she has had change in her bowel habits.  She used to be constipated but now is having multiple soft to loose stools  daily with fecal urgency and some incontinence.  She generally has four to  five loose stools on a daily basis.  Denies any nocturnal diarrhea, melena,  or rectal bleeding.  She also has typical acid reflux symptoms well  controlled on Aciphex.  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  Denies any  nausea or vomiting.  She has been on Vioxx for about a week.  Denies any  other NSAID previously.  She is on aspirin 81 mg daily as well.  She takes  Librax occasionally for her nerves and stomach pain.  She tells me that  she rarely even takes this and does not know whether it really helps her  abdominal pain.  She has never had an EGD or  colonoscopy.   The patient [name] has had a CT of the abdomen and pelvis on March 13, 2002.  This revealed a 6 mm nonspecific lung nodule of the right lung base.  Status post cholecystectomy.  The pancreas, the liver, and spleen were  unremarkable.  Bowel loops were unremarkable.  There was a questionable 2.3  cm diameter right ovarian cyst although the patient reportedly had complete  hysterectomy.  She had a CBC, sed rate, LFTs, amylase, lipase, and H. pylori  antibody which were all unremarkable.   CURRENT MEDICATIONS:  1. Fish oil one q.d.  2. Garlic tablet one q.d.  3. Aspirin 81 mg q.d.  4. Lipitor 40 mg q.d.  5. Multivitamin  q.d.  6. Calcium plus D vitamin q.d.  7. Aciphex q.d.  8. Librax one q.d. p.r.n.  9. Vioxx one q.d.   ALLERGIES:  No known drug allergies.   PAST MEDICAL HISTORY:  Myotonic dystrophy, low back arthritis,  hypercholesterolemia, gastroesophageal reflux disease.   PAST SURGICAL HISTORY:  Tonsillectomy at age 60, tubal ligation in 1976,  cholecystectomy for cholelithiasis in 1986, total hysterectomy in 1996.   FAMILY HISTORY:  Mother died of nonHodgkin's lymphoma age 38.  Father died  of brain cancer, neoblastoma.  She has diabetes and hypertension in a couple  of her siblings.  No family history of colorectal cancer.   SOCIAL HISTORY:  She has been married for 17 years in her second marriage.  She has four children, three of which also have myotonic dystrophy.  She is  retired.  She smokes 1-1/2 packs of cigarettes daily and smoked for 40  years.  She denies any alcohol use.   REVIEW OF SYSTEMS:  GI: Please see HPI.  GENERAL: Denies any weight loss.  CARDIOPULMONARY: Denies any shortness of breath or chest pain.   PHYSICAL EXAMINATION:  VITAL SIGNS:  Weight 201, height 5 feet 2 inches,  temperature 98.5, blood pressure 100/68, pulse 64.  GENERAL:  Pleasant well-nourished well-developed Caucasian female in no  acute distress.  SKIN:  Warm and  dry; no jaundice.  HEENT:  Pupils equal, round and reactive to light; conjunctivae are pink,  sclerae are nonicteric.  Oropharyngeal mucosa moist and pink; no lesions,  erythema, or exudate.  NECK:  No lymphadenopathy, thyromegaly, or carotid bruits.  CHEST:  Lungs clear to auscultation.  CARDIAC:  Regular rate and rhythm, 2/6 systolic ejection murmur heard best  in the upper precordium.  ABDOMEN:  Positive bowel sounds, obese but symmetrical, soft.  Mild  tenderness in the left upper quadrant region just below the left lower rib  margin; no organomegaly or masses.  EXTREMITIES:  No edema.   IMPRESSION:  The patient is a pleasant 65 year old lady with several-month  history of left upper quadrant abdominal pain which is intermittent in  nature and lasting only several seconds to minutes at a time.  She has also  had a change in her bowel habits from constipation to multiple loose stools  daily over the last 1 year.  She is having fecal urgency with incontinence  as well.  Symptoms may be due to irritable bowel syndrome however, we need  to rule out colonic stricture or even neoplasm.  She also has typical reflux  symptoms but responds well to Aciphex.  She complains of dysphagia which may  be due to her myotonic dystrophy but we need to rule out esophageal ring,  web, or stricture.   PLAN:  1. Colonoscopy with EGD plus dilatation in the near future.  2. The patient will receive SBE prophylaxis given heart murmur on     examination.  3. She will continue Aciphex 20 mg p.o. q.d.  4. Encourage her to take Librax more regularly to see if it helps with her     abdominal pain.  Advised her not to take     more than one to two daily.  5. Further recommendations to follow.   I would like to thank Dr. Sudie Bailey for allowing [place] to take part in the care  of this patient.     Tana Coast, P.A.  Jonathon Bellows, M.D.   LL/MEDQ  D:  03/25/2002  T:  03/25/2002  Job:   [id number]   cc:   Mila Homer. Sudie Bailey, M.D.  [address] [place], [place] [address]  Fax: [phone]

## 2010-08-20 NOTE — Op Note (Signed)
NAME:  [name], [name]                          ACCOUNT NO.:  [id number]   MEDICAL RECORD NO.:  [id number]                   PATIENT TYPE:  AMB   LOCATION:  DAY                                  FACILITY:  [facility]   PHYSICIAN:  R. Roetta Sessions, M.D.              DATE OF BIRTH:  1946/03/20   DATE OF PROCEDURE:  DATE OF DISCHARGE:                                 OPERATIVE REPORT   PROCEDURE:  Esophagogastroduodenoscopy with biopsy, followed by colonoscopy  with biopsy.   ENDOSCOPIST:  Gerrit Friends. Rourk, M.D.   INDICATIONS FOR PROCEDURE:  The patient is a 65 year old lady with a several  year history of left upper quadrant abdominal pain, intermittent  constipation, and diarrhea.  Colonoscopy is now being done as well as an  EGD.  Colonoscopy is being done in part for screening and in part to rule  out a structural lesion.  She also reports intermittent esophageal  dysphagia. She is on Aciphex 20 mg orally daily.   DESCRIPTION OF PROCEDURE:  O2 saturation, blood pressure, pulse and  respirations were monitored throughout the entire procedure.   CONSCIOUS SEDATION:  IV Versed  and Demerol in incremental doses.  SB  prophylaxis 2 gm ampicillin, Gentamicin 20 mg IV.  Cetacaine spray for  topical oropharyngeal anesthesia.   INSTRUMENT:  Olympus video chip adult gastroscope and colonoscopy.   FINDINGS:  Examination of the tubular esophagus revealed a patent tubular  esophagus and a patulous EG junction.  There was a 3 cm skinny strip of  salmon-colored epithelium extending up from what appeared to be the  squamocolumnar junction.  It was biopsied.  The remainder of the esophageal  mucosa appeared normal.   STOMACH:  The gastric cavity was empty.  It insufflated well with air.  A  thorough examination of the gastric mucosa including a retroflex view of the  proximal stomach and esophagogastric junction demonstrated no abnormalities.  The pylorus was patent and easily traversed.   DUODENUM:  The bulb and the second portion appeared normal.   THERAPY/DIAGNOSTIC MANEUVERS:  None.   The patient tolerated the procedure well and was prepared for colonoscopy.   COLONOSCOPY FINDINGS:  Digital rectal exam revealed no abnormalities.   ENDOSCOPIC FINDINGS:  The prep was good.   RECTUM:  Examination of the rectal mucosa including the retroflex view of  the anal verge revealed two 3-mm polyps in the rectum.  The remainder of the  rectal mucosa appeared normal.   COLON:  The colonic mucosa was surveyed from the rectosigmoid junction  through the left transverse and right colon to the area of the appendiceal  orifice, ileocecal valve, and cecum.  These structures were well seen and  photographed.  Aside from a tortuous elongated colon, the mucosa appeared  normal.   From the level of the cecum and ileocecal valve the scope was slowly and  cautiously withdrawn.  All previously  mentioned mucosal surfaces were again  seen; and, again, no other abnormalities were observed.  The polyps of the  rectum were cold biopsied/removed.  The patient tolerated both procedures  well and was reacted in endoscopy.   EGD IMPRESSION:  1. A short segment of salmon-colored epithelium in the distal esophagus,     consistent with Barrett's esophagus, biopsied.  2. The remainder of the esophageal mucosa, stomach, and duodenum through the     second portion appeared normal.   COLONOSCOPY IMPRESSION:  1. Diminutive polyps in the rectum cold biopsied/removed.  2. The remainder of the rectal mucosa and colon appeared normal.   RECOMMENDATIONS:  1. Continue Aciphex 20 mg orally daily.  2. Follow up on path.  3. Further recommendations to follow.                                               Jonathon Bellows, M.D.    RMR/MEDQ  D:  04/12/2002  T:  04/13/2002  Job:  [id number]   cc:   Mila Homer. Sudie Bailey, M.D.  [address] [place], [place] [address]  Fax: [phone]

## 2010-08-20 NOTE — Op Note (Signed)
[name], [name]                ACCOUNT NO.:  [id number]   MEDICAL RECORD NO.:  [id number]          PATIENT TYPE:  OIB   LOCATION:  6522                         FACILITY:  [facility]   PHYSICIAN:  Darlin Priestly, MD  DATE OF BIRTH:  16-Jun-1945   DATE OF PROCEDURE:  03/29/2005  DATE OF DISCHARGE:                                 OPERATIVE REPORT   OPERATIVE PROCEDURE:  Insertion of a Medtronic Adapta ADDRO1 generator,  serial [id number] with passive atrial and ventricular leads.   ATTENDING PHYSICIAN:  Darlin Priestly, M.D.   COMPLICATIONS:  None.   INDICATIONS:  [name] is a 65 year old female patient of Dr. Lavonne Chick  and Dr. Linna Darner with a history of hyperlipidemia and obstructive sleep apnea  on CPAP, history of muscular dystrophy with chronic bradycardia which has  been symptomatic of fatigue.  She has had no syncope or presyncope.  She is  now referred for dual-chamber pacer implant.   DESCRIPTION OF PROCEDURE:  After informed consent, the patient was brought  to the cardiac cath lab, where her left chest was prepped and draped in a  sterile fashion.  Blood pressure monitor was established, 1% lidocaine was  then used to anesthetize the left mid subclavicular area.  Next,  approximately a 3 x 4 cm pocket was then created over the left pectoral  fascia.  Hemostasis was obtained.  The left subclavian vein was easily  entered x2 under fluoroscopic guidance.  J-wires were retained.  Next, a #9  [place] safe sheath was then inserted over the first guidewire.  The  guidewire and dilator were then removed.  Following this, a 52 cm passive  ventricular lead, model #5092, serial #[id number], was then easily passed  into the SCV, right atrium.  The guidewire was retained.  The peel-away  sheaths were removed.  A 7 French dilator sheath was then placed over the  second guidewire.  The guidewire and dilator were then removed.  A second 45  cm passive Medtronic lead, model 5594,  serial [id number] was then easily  passed into the right atrium.  Sheaths were removed.  A J curve was then  positioned on the ventricular lead stylette, and then the ventricular lead  was then positioned in the RV apex.  Thresholds were then determined.  The R  wave was measured at 6.3 mV.  Impedence was 703 ohm.  Threshold in the  ventricle was 0.3 volts at 0.5 milliseconds.  Current is 0.6 milliamps.  Ten  volts negative for diaphragmatic stimulation.  The J curve is then allowed  to perform on the atrial lead, and atrial lead ___________ into position in  the left atrial appendage.  The impendence was measured at 2 mV.  The  impedence was 551 ohm.  The threshold in the atrium was 0.7 volts at 0.5  milliseconds.  The curve was 1.7 millivolts.  Again, 10 volts negative.  Two  silk sutures are then placed around each lead, which were then secured to  the pectoral fascia.  The pocket was then copiously irrigated with 1%  Kanamycin solution.  The leads were then connected in a serial fashion.  Two  Medtronic Adapta ADDRO1, serial [id number].  The head screws were  tightened, and the patient was confirmed.  A single silk suture was then  placed in the apex pocket.  The generator leads were then delivered into the  pocket, and the header was then connected to a silk suture.  The  subcutaneous layers were then closed using a running 2-0 Vicryl.  The skin  layers were then closed using a 4-0 Vicryl.  Steri-Strips were then applied.  The patient returned from the recovery room in stable condition.   CONCLUSION:  Successful implant of a Medtronic Adapta ADDRO1, serial  [id number] generator with passive atrial and ventricular leads.      Darlin Priestly, MD  Electronically Signed     RHM/MEDQ  D:  03/29/2005  T:  03/30/2005  Job:  [id number]

## 2010-08-20 NOTE — Procedures (Signed)
   NAME:  [name], [name]                          ACCOUNT NO.:  [id number]   MEDICAL RECORD NO.:  [id number]                   PATIENT TYPE:  AMB   LOCATION:  DAY                                  FACILITY:  [facility]   PHYSICIAN:  Jae Dire, P.A. [facility]               DATE OF BIRTH:  February 11, 1946   DATE OF PROCEDURE:  DATE OF DISCHARGE:  04/12/2002                                    STRESS TEST   INDICATION:  The patient is undergoing cardiac reevaluation for recent chest  pain and mild cardiomegaly by CT scan.   BASELINE DATA:  ECG:  Normal sinus rhythm at 47 beats per minute with  nonspecific ST changes.  Resting blood pressure 108/62.  Adenosine was  infused over four minutes. Cardiolite was injected at 3 minutes.   STRESS DATA:  ECG with no ischemic changes and no arrhythmias. The symptoms  included pain behind ears that resolved in the recovery period.   Final images and results are pending and being reviewed.                                                Jae Dire, P.A. [facility]    AB/MEDQ  D:  04/16/2002  T:  04/16/2002  Job:  [id number]

## 2010-09-09 ENCOUNTER — Ambulatory Visit: Payer: Medicare Other | Admitting: Gastroenterology

## 2011-02-14 ENCOUNTER — Ambulatory Visit (HOSPITAL_COMMUNITY)
Admission: RE | Admit: 2011-02-14 | Discharge: 2011-02-14 | Disposition: A | Discharge status: Home / Self Care | Payer: Medicare Other | Source: Ambulatory Visit | Attending: Cardiovascular Disease | Admitting: Cardiovascular Disease

## 2011-02-14 ENCOUNTER — Other Ambulatory Visit (HOSPITAL_COMMUNITY): Payer: Self-pay | Admitting: Cardiovascular Disease

## 2011-02-14 DIAGNOSIS — Z01811 Encounter for preprocedural respiratory examination: Secondary | ICD-10-CM

## 2011-02-14 DIAGNOSIS — R918 Other nonspecific abnormal finding of lung field: Secondary | ICD-10-CM | POA: Insufficient documentation

## 2011-02-14 DIAGNOSIS — Z01818 Encounter for other preprocedural examination: Secondary | ICD-10-CM | POA: Insufficient documentation

## 2011-02-18 ENCOUNTER — Encounter (HOSPITAL_COMMUNITY): Payer: Self-pay | Admitting: Cardiovascular Disease

## 2011-02-18 ENCOUNTER — Encounter (HOSPITAL_COMMUNITY)
Admission: RE | Disposition: A | Discharge status: Home / Self Care | Payer: Self-pay | Source: Ambulatory Visit | Attending: Cardiovascular Disease

## 2011-02-18 ENCOUNTER — Ambulatory Visit (HOSPITAL_COMMUNITY)
Admission: RE | Admit: 2011-02-18 | Discharge: 2011-02-19 | Disposition: A | Discharge status: Home / Self Care | Payer: Medicare Other | Source: Ambulatory Visit | Attending: Cardiovascular Disease | Admitting: Cardiovascular Disease

## 2011-02-18 DIAGNOSIS — Z87891 Personal history of nicotine dependence: Secondary | ICD-10-CM | POA: Insufficient documentation

## 2011-02-18 DIAGNOSIS — E785 Hyperlipidemia, unspecified: Secondary | ICD-10-CM | POA: Insufficient documentation

## 2011-02-18 DIAGNOSIS — E782 Mixed hyperlipidemia: Secondary | ICD-10-CM

## 2011-02-18 DIAGNOSIS — Z950 Presence of cardiac pacemaker: Secondary | ICD-10-CM

## 2011-02-18 DIAGNOSIS — G7111 Myotonic muscular dystrophy: Secondary | ICD-10-CM | POA: Insufficient documentation

## 2011-02-18 DIAGNOSIS — E119 Type 2 diabetes mellitus without complications: Secondary | ICD-10-CM | POA: Insufficient documentation

## 2011-02-18 DIAGNOSIS — I739 Peripheral vascular disease, unspecified: Secondary | ICD-10-CM

## 2011-02-18 DIAGNOSIS — I70219 Atherosclerosis of native arteries of extremities with intermittent claudication, unspecified extremity: Secondary | ICD-10-CM | POA: Insufficient documentation

## 2011-02-18 HISTORY — DX: Sleep apnea, unspecified: G47.30

## 2011-02-18 HISTORY — DX: Peripheral vascular disease, unspecified: I73.9

## 2011-02-18 HISTORY — DX: Myoneural disorder, unspecified: G70.9

## 2011-02-18 HISTORY — PX: LOWER EXTREMITY ANGIOGRAM: SHX5508

## 2011-02-18 HISTORY — PX: FEMORAL ARTERY STENT: SHX1583

## 2011-02-18 HISTORY — DX: Gastro-esophageal reflux disease without esophagitis: K21.9

## 2011-02-18 LAB — POCT ACTIVATED CLOTTING TIME
Activated Clotting Time: 210 seconds
Activated Clotting Time: 215 seconds

## 2011-02-18 SURGERY — ANGIOGRAM, LOWER EXTREMITY
Anesthesia: LOCAL

## 2011-02-18 MED ORDER — SODIUM CHLORIDE 0.9 % IJ SOLN: 500.0000 mL | Freq: Once | INTRAMUSCULAR | Status: DC

## 2011-02-18 MED ORDER — PANTOPRAZOLE SODIUM 40 MG PO TBEC
40.0000 mg | DELAYED_RELEASE_TABLET | Freq: Every day | ORAL | Status: DC
  Administered 2011-02-18: 40 mg via ORAL
  Filled 2011-02-18: qty 1

## 2011-02-18 MED ORDER — SODIUM CHLORIDE 0.9 % IV BOLUS (SEPSIS)
500.0000 mL | Freq: Once | INTRAVENOUS | Status: AC
  Administered 2011-02-18: 500 mL via INTRAVENOUS

## 2011-02-18 MED ORDER — SODIUM CHLORIDE 0.9 % IV SOLN
INTRAVENOUS | Status: AC
  Administered 2011-02-18: 15:00:00 via INTRAVENOUS

## 2011-02-18 MED ORDER — SIMVASTATIN 40 MG PO TABS
40.0000 mg | ORAL_TABLET | Freq: Every day | ORAL | Status: DC
  Administered 2011-02-18: 40 mg via ORAL
  Filled 2011-02-18 (×2): qty 1

## 2011-02-18 MED ORDER — ALBUTEROL SULFATE (5 MG/ML) 0.5% IN NEBU: 2.5000 mg | INHALATION_SOLUTION | RESPIRATORY_TRACT | Status: DC | PRN

## 2011-02-18 MED ORDER — ONDANSETRON HCL 4 MG/2ML IJ SOLN: 4.0000 mg | Freq: Four times a day (QID) | INTRAMUSCULAR | Status: DC | PRN

## 2011-02-18 MED ORDER — ASPIRIN 81 MG PO CHEW
CHEWABLE_TABLET | ORAL | Status: AC
  Filled 2011-02-18: qty 4

## 2011-02-18 MED ORDER — FENTANYL CITRATE 0.05 MG/ML IJ SOLN
INTRAMUSCULAR | Status: AC
  Filled 2011-02-18: qty 2

## 2011-02-18 MED ORDER — ALPRAZOLAM 0.5 MG PO TABS
1.0000 mg | ORAL_TABLET | Freq: Three times a day (TID) | ORAL | Status: DC | PRN
  Filled 2011-02-18: qty 2
  Filled 2011-02-18: qty 1

## 2011-02-18 MED ORDER — LIDOCAINE HCL (PF) 1 % IJ SOLN
INTRAMUSCULAR | Status: AC
  Filled 2011-02-18: qty 30

## 2011-02-18 MED ORDER — OXYCODONE-ACETAMINOPHEN 5-325 MG PO TABS: 1.0000 | ORAL_TABLET | Freq: Once | ORAL | Status: DC

## 2011-02-18 MED ORDER — HEPARIN SODIUM (PORCINE) 1000 UNIT/ML IJ SOLN
INTRAMUSCULAR | Status: AC
  Filled 2011-02-18: qty 1

## 2011-02-18 MED ORDER — HYDROCODONE-ACETAMINOPHEN 5-325 MG PO TABS
ORAL_TABLET | ORAL | Status: AC
  Filled 2011-02-18: qty 1

## 2011-02-18 MED ORDER — OXYCODONE-ACETAMINOPHEN 5-325 MG PO TABS
1.0000 | ORAL_TABLET | Freq: Once | ORAL | Status: AC
  Administered 2011-02-18: 1 via ORAL

## 2011-02-18 MED ORDER — HEPARIN (PORCINE) IN NACL 2-0.9 UNIT/ML-% IJ SOLN
INTRAMUSCULAR | Status: AC
  Filled 2011-02-18: qty 2000

## 2011-02-18 MED ORDER — ASPIRIN EC 325 MG PO TBEC
325.0000 mg | DELAYED_RELEASE_TABLET | Freq: Every day | ORAL | Status: DC
  Administered 2011-02-19: 325 mg via ORAL

## 2011-02-18 MED ORDER — CLOPIDOGREL BISULFATE 300 MG PO TABS
ORAL_TABLET | ORAL | Status: AC
  Administered 2011-02-19: 75 mg via ORAL
  Filled 2011-02-18: qty 1

## 2011-02-18 MED ORDER — SODIUM CHLORIDE 0.9 % IJ SOLN: 3.0000 mL | INTRAMUSCULAR | Status: DC | PRN

## 2011-02-18 MED ORDER — SODIUM CHLORIDE 0.9 % IV SOLN
250.0000 mL | INTRAVENOUS | Status: DC
  Administered 2011-02-18: 07:00:00 via INTRAVENOUS

## 2011-02-18 MED ORDER — OXYCODONE-ACETAMINOPHEN 5-325 MG PO TABS
1.0000 | ORAL_TABLET | Freq: Four times a day (QID) | ORAL | Status: DC | PRN
  Administered 2011-02-18 – 2011-02-19 (×3): 1 via ORAL
  Filled 2011-02-18 (×3): qty 1

## 2011-02-18 MED ORDER — BIOTENE DRY MOUTH MT LIQD: 15.0000 mL | Freq: Two times a day (BID) | OROMUCOSAL | Status: DC

## 2011-02-18 MED ORDER — CLOPIDOGREL BISULFATE 75 MG PO TABS
75.0000 mg | ORAL_TABLET | Freq: Every day | ORAL | Status: DC
  Administered 2011-02-19: 75 mg via ORAL

## 2011-02-18 MED ORDER — ACETAMINOPHEN 325 MG PO TABS: 650.0000 mg | ORAL_TABLET | ORAL | Status: DC | PRN

## 2011-02-18 NOTE — H&P (Signed)
[name] is an 65 y.o. female.   Chief Complaint: Leg pain HPI: the patient is a 65 year old Caucasian female with history of sinus no dysfunction status post implantation of dual-chamber pacemaker in 2006.  She also has a history of diabetes hyperlipidemia 50-pack-year tobacco history and currently smokes a pack a day.  [name] has a history of myotonic dystrophy. She is [employer] he Dopplers performed in the office and showed a right ABI of 0.9 left ABI 0.7 and was high-frequency signal in her mid left SFA which had progressed since her last  studied a year ago.  She complains of [name] AMI pain at rest or with ambulation. She states that the leg pain is not necessarily get worse with ambulation. She denies chest pain shortness of breath abdominal pain nausea vomiting palpitations diaphoresis. She's had no known weight change gain or loss is unexplained. She's also had no change in her vision acutely. She does complain of some chronic low back pain.  PMH Diabetes hyperlipidemia 50 pack year tobacco abuse history of sinus node dysfunction status post dual-chamber pacemaker 2006 peripheral vascular disease  FH Her family history of heart disease.  Social History: patient is a 50-pack-year smoking history and currently smokes one pack per day.  Allergies:  Allergies  Allergen Reactions  . Prednisone Other (See Comments)    Chest pain    Medications Prior to Admission  Medication Dose Route Frequency Provider Last Rate Last Dose  . 0.9 %  sodium chloride infusion  250 mL Intravenous Continuous Melinda Gess, MD 70 mL/hr at 02/18/11 [phone]    . sodium chloride 0.9 % injection 3 mL  3 mL Intravenous PRN Melinda Gess, MD       Medications Prior to Admission  Medication Sig Dispense Refill  . albuterol (PROVENTIL) (2.5 MG/3ML) 0.083% nebulizer solution Take 2.5 mg by nebulization every 6 (six) hours as needed. Shortness of breath, wheezing       . ALPRAZolam (XANAX) 1 MG tablet Take 1  mg by mouth 3 (three) times daily as needed. anxiety       . aspirin EC 81 MG tablet Take 81 mg by mouth daily.        Marland Kitchen omeprazole (PRILOSEC) 20 MG capsule Take 20 mg by mouth daily.        Marland Kitchen oxyCODONE-acetaminophen (PERCOCET) 10-325 MG per tablet Take 1 tablet by mouth 4 (four) times daily.        . simvastatin (ZOCOR) 40 MG tablet Take 40 mg by mouth every evening.          No results found for this or any previous visit (from the past 48 hour(s)). No results found.  ROSas per history of present illness  Blood pressure 102/77, pulse 73, temperature 97.6 F (36.4 C), temperature source Oral, resp. rate 18, height 5\' 1"  (1.549 m), weight 69.854 kg (154 lb), SpO2 94.00%. Physical Exam  Gen. Obese Caucasian female in no apparent stress, HEENT pupils equal round reactive light accommodation extraocular movements are intact. Neck is supple without lymphadenopathy cardiovascular regular rate and rhythm negative murmurs rubs or gallops. Pulmonary clear to auscultation bilaterally. Abdomen: Positive bowel sounds all quadrants soft nontender. Peripheral vascular: Soft left carotid bruit 2+ radial pulses 1+ dorsalis pedis pulses negative Lower extremity edema negative femoral bruits.  Neuro: Patient is alert and oriented strength is 4/5 equal bilateral upper and lower extremities. Assessment/Plan #1 peripheral vascular disease #2 tobacco abuse #3 history of sinus node dysfunction status post  permanent pacemaker  Plan patient presents for lower extremity angiogram and possible intervention.  [name] 02/18/2011, 9:00 AM    Agree with note written by [name]  [name] 02/18/2011 10:09 AM

## 2011-02-18 NOTE — Brief Op Note (Signed)
   Dictation#[id number] MRN# [id number] CSN# [id number]  Runell Gess 02/18/2011 11:17 AM

## 2011-02-19 DIAGNOSIS — Z87891 Personal history of nicotine dependence: Secondary | ICD-10-CM

## 2011-02-19 DIAGNOSIS — E782 Mixed hyperlipidemia: Secondary | ICD-10-CM

## 2011-02-19 DIAGNOSIS — Z950 Presence of cardiac pacemaker: Secondary | ICD-10-CM

## 2011-02-19 DIAGNOSIS — G7111 Myotonic muscular dystrophy: Secondary | ICD-10-CM

## 2011-02-19 DIAGNOSIS — I739 Peripheral vascular disease, unspecified: Secondary | ICD-10-CM

## 2011-02-19 DIAGNOSIS — E119 Type 2 diabetes mellitus without complications: Secondary | ICD-10-CM

## 2011-02-19 LAB — BASIC METABOLIC PANEL
CO2: 28 mEq/L (ref 19–32)
Calcium: 9.5 mg/dL (ref 8.4–10.5)
Creatinine, Ser: 1.1 mg/dL (ref 0.50–1.10)
GFR calc Af Amer: 60 mL/min — ABNORMAL LOW (ref >90)
Sodium: 144 mEq/L (ref 135–145)

## 2011-02-19 LAB — CBC
Hemoglobin: 10.5 g/dL — ABNORMAL LOW (ref 12.0–15.0)
MCH: 31 pg (ref 26.0–34.0)
MCHC: 32 g/dL (ref 30.0–36.0)
Platelets: 205 10*3/uL (ref 150–400)

## 2011-02-19 MED ORDER — CLOPIDOGREL BISULFATE 75 MG PO TABS: 75.0000 mg | ORAL_TABLET | Freq: Every day | ORAL | Status: DC

## 2011-02-19 NOTE — Progress Notes (Signed)
Subjective:  No CP/SOB. Says left foot warm and less painful.   Objective:  Temp:  [97.2 F (36.2 C)-98.5 F (36.9 C)] 98.3 F (36.8 C) (11/17 0700) Pulse Rate:  [70-75] 70  (11/17 0700) Resp:  [12-17] 16  (11/17 0700) BP: (74-132)/(47-74) 90/58 mmHg (11/17 0700) SpO2:  [93 %-98 %] 95 % (11/17 0700) Weight change:   Intake/Output from previous day: 11/16 0701 - 11/17 0700 In: 1277.5 [P.O.:480; I.V.:297.5; IV Piggyback:500] Out: 2 [Urine:2]  Intake/Output from this shift:    Physical Exam: General appearance: alert and cooperative Neck: no adenopathy, no carotid bruit, no JVD, supple, symmetrical, trachea midline and thyroid not enlarged, symmetric, no tenderness/mass/nodules Lungs: clear to auscultation bilaterally Heart: regular rate and rhythm, S1, S2 normal, no murmur, click, rub or gallop Extremities: extremities normal, atraumatic, no cyanosis or edema  Lab Results: Results for orders placed during the hospital encounter of 02/18/11 (from the past 48 hour(s))  POCT ACTIVATED CLOTTING TIME     Status: Normal   Collection Time   02/18/11 10:35 AM      Component Value Range Comment   Activated Clotting Time 215     POCT ACTIVATED CLOTTING TIME     Status: Normal   Collection Time   02/18/11 11:01 AM      Component Value Range Comment   Activated Clotting Time 210     GLUCOSE, CAPILLARY     Status: Abnormal   Collection Time   02/18/11 11:32 AM      Component Value Range Comment   Glucose-Capillary 108 (*) 70 - 99 (mg/dL)   POCT ACTIVATED CLOTTING TIME     Status: Normal   Collection Time   02/18/11 11:40 AM      Component Value Range Comment   Activated Clotting Time 177       Imaging: Imaging results have been reviewed  Assessment/Plan:   1. Principal Problem: 2.  *Claudication 3.   Time Spent Directly with Patient:  20 minutes  Length of Stay:  LOS: 1 day    Pt is S/P LSFA PTA and Stent. Right groin OK. 2+ PP Left foot. Labs pending. Plan D/C  home today. LEA then ROV with me 2-3 weeks.     Runell Gess 02/19/2011, 9:21 AM

## 2011-02-19 NOTE — Discharge Summary (Signed)
Physician Discharge Summary  Patient ID: [name] MRN: [id number] DOB/AGE: 1945-05-02 65 y.o.  Admit date: 02/18/2011 Discharge date: 02/19/2011  Admission Diagnoses:  Discharge Diagnoses:  Principal Problem:  *Claudication  PVD- s/p Lt SFA PTA Hx PTVDP 2006 for SB Nl LVF, neg Cardiolite as OP Dyslipidemia, treated Type 2 DM, diet Myotonic Dystrophy Hx smoking  Discharged Condition: stable  Hospital Course: 65 y/o followed by Dr Royann Shivers and Dr Sudie Bailey with a history of sinus bradycardia with PTVDP 2006. She has had previous negative Cardiolite in 2006 and an echo in 2009 with an EF 70%. She has know PVD by doppler. Recent OP dopplers suggested progression of Lt SFA disease with claudication. She is admitted now for elective PVA. This was done 02/18/2011 by Dr Allyson Sabal. She had 70% Lt SFA which was dilated and stented. She tolerated this well. Dr Allyson Sabal feels she can be d/c'd this am.  Consults: none  Significant Diagnostic Studies: PV angio  Treatments: Lt SFA PTA/ Stent  Discharge Exam: Blood pressure 90/58, pulse 70, temperature 98.3 F (36.8 C), temperature source Oral, resp. rate 16, height 5\' 1"  (1.549 m), weight 69.854 kg (154 lb), SpO2 95.00%.   Disposition: Home or Self Care   Current Discharge Medication List    START taking these medications   Details  clopidogrel (PLAVIX) 75 MG tablet Take 1 tablet (75 mg total) by mouth daily. Qty: 30 tablet, Refills: 5      CONTINUE these medications which have NOT CHANGED   Details  albuterol (PROVENTIL) (2.5 MG/3ML) 0.083% nebulizer solution Take 2.5 mg by nebulization every 6 (six) hours as needed. Shortness of breath, wheezing     ALPRAZolam (XANAX) 1 MG tablet Take 1 mg by mouth 3 (three) times daily as needed. anxiety     aspirin EC 81 MG tablet Take 81 mg by mouth daily.      omeprazole (PRILOSEC) 20 MG capsule Take 20 mg by mouth daily.      oxyCODONE-acetaminophen (PERCOCET) 10-325 MG per tablet Take  1 tablet by mouth 4 (four) times daily.      simvastatin (ZOCOR) 40 MG tablet Take 40 mg by mouth every evening.         Follow-up Information    Follow up with Runell Gess, MD. (office will call)    Contact information:   [address]  [place] [address] [phone]          Signed: Abelino Derrick 02/19/2011, 11:55 AM

## 2011-02-19 NOTE — Procedures (Signed)
NAMEKALSEY, LULL NO.:  [id number]  MEDICAL RECORD NO.:  [id number]  LOCATION:  [facility]                         FACILITY:  [facility]  PHYSICIAN:  Nanetta Batty, M.D.   DATE OF BIRTH:  10/05/1945  DATE OF PROCEDURE: DATE OF DISCHARGE:                   PERIPHERAL VASCULAR INVASIVE PROCEDURE   Ms. Boeding is a 65 year old Caucasian female with a history of permanent transvenous pacemaker.  The risk factors include diabetes, hyperlipidemia, and tobacco abuse having recently quit.  She does have left lower extremity lifestyle limiting claudication.  The patient had normal LV function by 2D echo and negative Myoview.  Lower extremity Dopplers performed in June revealed a right ABI of 0.9 and the left of 0.69 with high-frequency signal in the mid left SFA.  She presents now for angiography and potential percutaneous intervention for lifestyle limiting claudication.  PROCEDURE DESCRIPTION:  The patient was brought to the Second Floor [facility] in the postabsorptive state.  She was premedicated with p.o. Valium, IV fentanyl.  Her right groin was prepped and shaved in usual sterile fashion.  1% Xylocaine was used for local anesthesia.  A 5-French sheath was inserted into the right femoral artery using standard Seldinger technique.  A 5-French pigtail catheter was used for abdominal aortography with bifemoral runoff using digital subtraction bolus-chase step table technique.  Visipaque dye was used for the entirety of the case.  Retrograde aortic pressures monitored during the case, and she received __________  ANGIOGRAPHIC RESULTS: 1. Abdominal aorta.     a.     Renal arteries-normal.     b.     Infrarenal abdominal aorta-normal. 2. Left lower extremity.     a.     Long segmental 60%-70% proximal to mid SFA with focal 80%      lesions within.  There is three-vessel runoff with small tibial      vessels. 3. Right lower extremity.     a.     A  60% segmental mid right SFA with three-vessel runoff      (small tibial vessels).  PROCEDURE DESCRIPTION:  Contralateral access was obtained with a crossover catheter, bursa core wire and 6-French destination sheath. The patient then received 4 chewable baby aspirin 300 mg p.o., Plavix. A total of 7000 units of heparin was administered during the case.  An ACT of 210.  Total of 161 mL of contrast was administered.  The left SFA was pre-dilated with 4 x 10 balloon.  Overlapping Nitinol Smart self-expanding stents were then deployed (6 x 150, 6 x 60) and post dilated with a 5 x 100 balloon at 2 atmospheres resulting reduction of a long 70% proximal and mid segmental left SFA stenosis with fairly focal 80% stenosis to 0% residual.  The infrapopliteal vessels remained intact.  The flow was slow.  The patient tolerated procedure well.  The sheath was then withdrawn across the bifurcation and secured in place, and the patient left lab in stable condition.  IMPRESSION:  Successful PTA and stenting of the left SFA using overlapping Nitinol self-expanding stent for lifestyle limiting claudication.  The sheath will be removed once the ACT falls below 170 and pressure will be held at groin to  achieve hemostasis.  The patient will be hydrated overnight and with aspirin and Plavix and discharged home in the morning.  She will get follow up Dopplers in my office and will see me back.  The patient left lab in stable condition.     Nanetta Batty, M.D.     JB/MEDQ  D:  02/18/2011  T:  02/18/2011  Job:  [id number]  cc:   [facility] Alen Blew, Dr.

## 2011-02-19 NOTE — Progress Notes (Signed)
Right groin. Level 0. [name]  

## 2011-02-22 ENCOUNTER — Encounter (HOSPITAL_COMMUNITY): Payer: Self-pay

## 2011-03-16 ENCOUNTER — Other Ambulatory Visit (HOSPITAL_COMMUNITY): Payer: Self-pay | Admitting: Family Medicine

## 2011-03-16 DIAGNOSIS — R222 Localized swelling, mass and lump, trunk: Secondary | ICD-10-CM

## 2011-03-18 ENCOUNTER — Ambulatory Visit (HOSPITAL_COMMUNITY)
Admission: RE | Admit: 2011-03-18 | Discharge: 2011-03-18 | Disposition: A | Discharge status: Home / Self Care | Payer: Medicare Other | Source: Ambulatory Visit | Attending: Family Medicine | Admitting: Family Medicine

## 2011-03-18 DIAGNOSIS — R222 Localized swelling, mass and lump, trunk: Secondary | ICD-10-CM

## 2011-03-18 DIAGNOSIS — J984 Other disorders of lung: Secondary | ICD-10-CM | POA: Insufficient documentation

## 2011-10-05 HISTORY — PX: TRANSTHORACIC ECHOCARDIOGRAM: SHX275

## 2011-10-05 HISTORY — PX: NM MYOCAR PERF WALL MOTION: HXRAD629

## 2011-12-30 ENCOUNTER — Other Ambulatory Visit (HOSPITAL_COMMUNITY): Payer: Self-pay | Admitting: Family Medicine

## 2011-12-30 DIAGNOSIS — Z139 Encounter for screening, unspecified: Secondary | ICD-10-CM

## 2012-01-05 ENCOUNTER — Telehealth: Payer: Self-pay | Admitting: Gastroenterology

## 2012-01-05 NOTE — Telephone Encounter (Signed)
Pt called to [facility] her procedure. She said Sudie Bailey had referred her to have one done, but she had to cancel and now needs to reschedule. Please advise if I need to bring her in for OV or if she can be triaged. [phone]

## 2012-01-05 NOTE — Telephone Encounter (Signed)
Called pt at [phone] and line was busy. I have not received a referral on this pt. Doesn't look like she has had an appt. Will try to call again later.

## 2012-01-06 NOTE — Telephone Encounter (Signed)
Called pt. She said she has constipation and was supposed to get colonoscopy sometime. Said she had an appt for OV sometime back, but had to cancel. She is rescheduled for 01/10/2012 @3 :00 PM with Lorenza Burton, NP. ( She said Dr. Sudie Bailey had previously referred her, but I have no referral).

## 2012-01-09 ENCOUNTER — Ambulatory Visit (HOSPITAL_COMMUNITY): Payer: Medicare Other

## 2012-01-09 ENCOUNTER — Encounter: Payer: Self-pay | Admitting: Internal Medicine

## 2012-01-10 ENCOUNTER — Ambulatory Visit: Payer: Medicare Other | Admitting: Urgent Care

## 2012-01-17 ENCOUNTER — Ambulatory Visit: Payer: Medicare Other | Admitting: Urgent Care

## 2012-01-24 ENCOUNTER — Encounter (HOSPITAL_COMMUNITY): Payer: Self-pay | Admitting: *Deleted

## 2012-01-24 ENCOUNTER — Emergency Department (HOSPITAL_COMMUNITY): Payer: Medicare Other

## 2012-01-24 ENCOUNTER — Inpatient Hospital Stay (HOSPITAL_COMMUNITY)
Admission: EM | Admit: 2012-01-24 | Discharge: 2012-01-31 | DRG: 690 | Disposition: A | Discharge status: Home / Self Care | Payer: Medicare Other | Attending: Family Medicine | Admitting: Family Medicine

## 2012-01-24 DIAGNOSIS — Z7982 Long term (current) use of aspirin: Secondary | ICD-10-CM

## 2012-01-24 DIAGNOSIS — Z79899 Other long term (current) drug therapy: Secondary | ICD-10-CM

## 2012-01-24 DIAGNOSIS — IMO0002 Reserved for concepts with insufficient information to code with codable children: Secondary | ICD-10-CM | POA: Diagnosis present

## 2012-01-24 DIAGNOSIS — Z860100 Personal history of colon polyps, unspecified: Secondary | ICD-10-CM

## 2012-01-24 DIAGNOSIS — G8929 Other chronic pain: Secondary | ICD-10-CM | POA: Diagnosis present

## 2012-01-24 DIAGNOSIS — Z9089 Acquired absence of other organs: Secondary | ICD-10-CM

## 2012-01-24 DIAGNOSIS — M545 Low back pain, unspecified: Secondary | ICD-10-CM | POA: Diagnosis present

## 2012-01-24 DIAGNOSIS — E860 Dehydration: Secondary | ICD-10-CM | POA: Diagnosis present

## 2012-01-24 DIAGNOSIS — I959 Hypotension, unspecified: Secondary | ICD-10-CM | POA: Diagnosis present

## 2012-01-24 DIAGNOSIS — G4730 Sleep apnea, unspecified: Secondary | ICD-10-CM | POA: Diagnosis present

## 2012-01-24 DIAGNOSIS — K219 Gastro-esophageal reflux disease without esophagitis: Secondary | ICD-10-CM | POA: Diagnosis present

## 2012-01-24 DIAGNOSIS — G7109 Other specified muscular dystrophies: Secondary | ICD-10-CM | POA: Diagnosis present

## 2012-01-24 DIAGNOSIS — E785 Hyperlipidemia, unspecified: Secondary | ICD-10-CM | POA: Diagnosis present

## 2012-01-24 DIAGNOSIS — Z950 Presence of cardiac pacemaker: Secondary | ICD-10-CM

## 2012-01-24 DIAGNOSIS — N390 Urinary tract infection, site not specified: Principal | ICD-10-CM

## 2012-01-24 DIAGNOSIS — Z888 Allergy status to other drugs, medicaments and biological substances status: Secondary | ICD-10-CM

## 2012-01-24 DIAGNOSIS — Z90710 Acquired absence of both cervix and uterus: Secondary | ICD-10-CM

## 2012-01-24 DIAGNOSIS — Z87891 Personal history of nicotine dependence: Secondary | ICD-10-CM

## 2012-01-24 DIAGNOSIS — B9689 Other specified bacterial agents as the cause of diseases classified elsewhere: Secondary | ICD-10-CM | POA: Diagnosis present

## 2012-01-24 DIAGNOSIS — N289 Disorder of kidney and ureter, unspecified: Secondary | ICD-10-CM | POA: Diagnosis present

## 2012-01-24 DIAGNOSIS — R5381 Other malaise: Secondary | ICD-10-CM | POA: Diagnosis present

## 2012-01-24 DIAGNOSIS — A419 Sepsis, unspecified organism: Secondary | ICD-10-CM

## 2012-01-24 DIAGNOSIS — I739 Peripheral vascular disease, unspecified: Secondary | ICD-10-CM | POA: Diagnosis present

## 2012-01-24 DIAGNOSIS — R6520 Severe sepsis without septic shock: Secondary | ICD-10-CM

## 2012-01-24 LAB — CBC
HCT: 36.2 % (ref 36.0–46.0)
Hemoglobin: 11.8 g/dL — ABNORMAL LOW (ref 12.0–15.0)
MCH: 31 pg (ref 26.0–34.0)
MCHC: 32.6 g/dL (ref 30.0–36.0)
MCV: 95 fL (ref 78.0–100.0)
Platelets: 179 10*3/uL (ref 150–400)
RBC: 3.81 MIL/uL — ABNORMAL LOW (ref 3.87–5.11)
RDW: 15.1 % (ref 11.5–15.5)
WBC: 11.9 10*3/uL — ABNORMAL HIGH (ref 4.0–10.5)

## 2012-01-24 LAB — URINE MICROSCOPIC-ADD ON

## 2012-01-24 LAB — BASIC METABOLIC PANEL
BUN: 22 mg/dL (ref 6–23)
CO2: 27 mEq/L (ref 19–32)
Calcium: 8.9 mg/dL (ref 8.4–10.5)
Chloride: 106 mEq/L (ref 96–112)
Creatinine, Ser: 1.3 mg/dL — ABNORMAL HIGH (ref 0.50–1.10)
GFR calc Af Amer: 48 mL/min — ABNORMAL LOW (ref >90)
GFR calc non Af Amer: 42 mL/min — ABNORMAL LOW (ref >90)
Glucose, Bld: 123 mg/dL — ABNORMAL HIGH (ref 70–99)
Potassium: 4.6 mEq/L (ref 3.5–5.1)
Sodium: 139 mEq/L (ref 135–145)

## 2012-01-24 LAB — GLUCOSE, CAPILLARY
Glucose-Capillary: 113 mg/dL — ABNORMAL HIGH (ref 70–99)
Glucose-Capillary: 129 mg/dL — ABNORMAL HIGH (ref 70–99)

## 2012-01-24 LAB — URINALYSIS, ROUTINE W REFLEX MICROSCOPIC
Bilirubin Urine: NEGATIVE
Glucose, UA: NEGATIVE mg/dL
Ketones, ur: NEGATIVE mg/dL
Nitrite: NEGATIVE
Specific Gravity, Urine: 1.015 (ref 1.005–1.030)
Urobilinogen, UA: 0.2 mg/dL (ref 0.0–1.0)
pH: 5.5 (ref 5.0–8.0)

## 2012-01-24 LAB — TROPONIN I: Troponin I: <0.3 ng/mL (ref <0.30)

## 2012-01-24 LAB — LACTIC ACID, PLASMA: Lactic Acid, Venous: 0.3 mmol/L — ABNORMAL LOW (ref 0.5–2.2)

## 2012-01-24 LAB — MRSA PCR SCREENING: MRSA by PCR: NEGATIVE

## 2012-01-24 MED ORDER — PIPERACILLIN-TAZOBACTAM 3.375 G IVPB
3.3750 g | Freq: Three times a day (TID) | INTRAVENOUS | Status: DC
  Administered 2012-01-25 – 2012-01-27 (×7): 3.375 g via INTRAVENOUS
  Filled 2012-01-24 (×18): qty 50

## 2012-01-24 MED ORDER — SODIUM CHLORIDE 0.9 % IV BOLUS (SEPSIS)
1000.0000 mL | Freq: Once | INTRAVENOUS | Status: AC
  Administered 2012-01-24: 1000 mL via INTRAVENOUS

## 2012-01-24 MED ORDER — SIMVASTATIN 20 MG PO TABS
40.0000 mg | ORAL_TABLET | Freq: Every evening | ORAL | Status: DC
  Administered 2012-01-25 – 2012-01-30 (×6): 40 mg via ORAL
  Filled 2012-01-24 (×6): qty 2

## 2012-01-24 MED ORDER — SODIUM CHLORIDE 0.9 % IV SOLN
Freq: Once | INTRAVENOUS | Status: AC
  Administered 2012-01-24: 21:00:00 via INTRAVENOUS

## 2012-01-24 MED ORDER — PIPERACILLIN-TAZOBACTAM 3.375 G IVPB 30 MIN
3.3750 g | Freq: Once | INTRAVENOUS | Status: AC
  Administered 2012-01-24: 3.375 g via INTRAVENOUS

## 2012-01-24 MED ORDER — ACETAMINOPHEN 325 MG PO TABS
650.0000 mg | ORAL_TABLET | Freq: Once | ORAL | Status: AC
  Administered 2012-01-24: 650 mg via ORAL
  Filled 2012-01-24: qty 2

## 2012-01-24 MED ORDER — PANTOPRAZOLE SODIUM 40 MG PO TBEC
40.0000 mg | DELAYED_RELEASE_TABLET | Freq: Every day | ORAL | Status: DC
  Administered 2012-01-25 – 2012-01-31 (×7): 40 mg via ORAL
  Filled 2012-01-24 (×7): qty 1

## 2012-01-24 MED ORDER — PIPERACILLIN-TAZOBACTAM 3.375 G IVPB
INTRAVENOUS | Status: AC
  Filled 2012-01-24: qty 50

## 2012-01-24 MED ORDER — ASPIRIN EC 81 MG PO TBEC
81.0000 mg | DELAYED_RELEASE_TABLET | Freq: Every day | ORAL | Status: DC
  Administered 2012-01-25 – 2012-01-31 (×7): 81 mg via ORAL
  Filled 2012-01-24 (×7): qty 1

## 2012-01-24 MED ORDER — ENOXAPARIN SODIUM 40 MG/0.4ML ~~LOC~~ SOLN
40.0000 mg | SUBCUTANEOUS | Status: DC
  Administered 2012-01-24 – 2012-01-30 (×7): 40 mg via SUBCUTANEOUS
  Filled 2012-01-24 (×7): qty 0.4

## 2012-01-24 MED ORDER — ALBUTEROL SULFATE (5 MG/ML) 0.5% IN NEBU
2.5000 mg | INHALATION_SOLUTION | Freq: Four times a day (QID) | RESPIRATORY_TRACT | Status: DC
  Administered 2012-01-24 – 2012-01-31 (×27): 2.5 mg via RESPIRATORY_TRACT
  Filled 2012-01-24 (×28): qty 0.5

## 2012-01-24 NOTE — ED Provider Notes (Signed)
History    66 year old female with fatigue. For the past week patient has been progressively more weak/tired. Global weakness. Denies focal deficit. No fevers or chills. Mild shortness of breath  chronic in nature. Denies any acute pain. No urinary complaints. No recent falls. No recent medication changes. No n/v/d. No BRBPR or melna.  CSN: [id number]  Arrival date & time 01/24/12  1601   First MD Initiated Contact with Patient 01/24/12 1608      Chief Complaint  Patient presents with  . Weakness    (Consider location/radiation/quality/duration/timing/severity/associated sxs/prior treatment) HPI  Past Medical History  Diagnosis Date  . Sleep apnea     uses cpap  . Peripheral vascular disease   . GERD (gastroesophageal reflux disease)   . Neuromuscular disorder     HX of MD    Past Surgical History  Procedure Date  . Insert / replace / remove pacemaker   . Cholecystectomy   . Abdominal hysterectomy   . Tonsillectomy   . Tubal ligation   . Femoral artery stent 02/18/2011  . Esophagogastroduodenoscopy 04/13/2002    A short segment of salmon-colored epithelium in the distal esophagus  consistent with Barrett's esophagus, biopsied/ The remainder of the esophageal mucosa, stomach, and duodenum through the second portion appeared normal  . Colonoscopy 04/13/2002    Diminutive polyps in the rectum cold biopsied/removed/ The remainder of the rectal mucosa and colon appeared normal.    History reviewed. No pertinent family history.  History  Substance Use Topics  . Smoking status: Former Smoker -- 50 years    Types: Cigarettes  . Smokeless tobacco: Never Used  . Alcohol Use: No    OB History    Grav Para Term Preterm Abortions TAB SAB Ect Mult Living                  Review of Systems   Review of symptoms negative unless otherwise noted in HPI.   Allergies  Prednisone  Home Medications   Current Outpatient Rx  Name Route Sig Dispense Refill  . ALBUTEROL  SULFATE (2.5 MG/3ML) 0.083% IN NEBU Nebulization Take 2.5 mg by nebulization every 6 (six) hours as needed. Shortness of breath, wheezing     . ALPRAZOLAM 1 MG PO TABS Oral Take 1 mg by mouth 3 (three) times daily as needed. anxiety     . ASPIRIN EC 81 MG PO TBEC Oral Take 81 mg by mouth daily.      Marland Kitchen CLOPIDOGREL BISULFATE 75 MG PO TABS Oral Take 1 tablet (75 mg total) by mouth daily. 30 tablet 5  . OMEPRAZOLE 20 MG PO CPDR Oral Take 20 mg by mouth daily.      . OXYCODONE-ACETAMINOPHEN 10-325 MG PO TABS Oral Take 1 tablet by mouth 4 (four) times daily.      Marland Kitchen SIMVASTATIN 40 MG PO TABS Oral Take 40 mg by mouth every evening.        BP 84/43  Pulse 72  Temp 101.1 F (38.4 C) (Rectal)  Resp 15  SpO2 97%  Physical Exam  Nursing note and vitals reviewed. Constitutional: She is oriented to person, place, and time.       Sitting in bed with eyes closed. Drowsy, but opens eyes to voice and responds to questions appropriately.  HENT:  Head: Normocephalic and atraumatic.  Eyes: Conjunctivae normal are normal. Pupils are equal, round, and reactive to light. Right eye exhibits no discharge. Left eye exhibits no discharge.  Neck: Neck supple.  Cardiovascular:  Normal rate, regular rhythm and normal heart sounds.  Exam reveals no gallop and no friction rub.   No murmur heard. Pulmonary/Chest: Effort normal and breath sounds normal. No respiratory distress.  Abdominal: Soft. She exhibits no distension. There is no tenderness.  Musculoskeletal: She exhibits no edema and no tenderness.  Neurological: She is alert and oriented to person, place, and time. No cranial nerve deficit. She exhibits normal muscle tone. Coordination normal.  Skin: Skin is warm and dry.  Psychiatric: She has a normal mood and affect. Her behavior is normal. Thought content normal.    ED Course  Procedures (including critical care time)  CRITICAL CARE Performed by: Raeford Razor   Total critical care time: 40  minutes  Critical care time was exclusive of separately billable procedures and treating other patients.  Critical care was necessary to treat or prevent imminent or life-threatening deterioration.  Critical care was time spent personally by me on the following activities: development of treatment plan with patient and/or surrogate as well as nursing, discussions with consultants, evaluation of patient's response to treatment, examination of patient, obtaining history from patient or surrogate, ordering and performing treatments and interventions, ordering and review of laboratory studies, ordering and review of radiographic studies, pulse oximetry and re-evaluation of patient's condition.   Labs Reviewed  CBC - Abnormal; Notable for the following:    WBC 11.9 (*)     RBC 3.81 (*)     Hemoglobin 11.8 (*)     All other components within normal limits  BASIC METABOLIC PANEL - Abnormal; Notable for the following:    Glucose, Bld 123 (*)     Creatinine, Ser 1.30 (*)     GFR calc non Af Amer 42 (*)     GFR calc Af Amer 48 (*)     All other components within normal limits  URINALYSIS, ROUTINE W REFLEX MICROSCOPIC - Abnormal; Notable for the following:    APPearance HAZY (*)     Hgb urine dipstick MODERATE (*)     Protein, ur TRACE (*)     Leukocytes, UA MODERATE (*)     All other components within normal limits  LACTIC ACID, PLASMA - Abnormal; Notable for the following:    Lactic Acid, Venous 0.3 (*)     All other components within normal limits  URINE MICROSCOPIC-ADD ON - Abnormal; Notable for the following:    Bacteria, UA MANY (*)     All other components within normal limits  TROPONIN I  CULTURE, BLOOD (ROUTINE X 2)  CULTURE, BLOOD (ROUTINE X 2)  URINE CULTURE   Dg Chest Portable 1 View  01/24/2012  *RADIOLOGY REPORT*  Clinical Data: Weakness.  PORTABLE CHEST - 1 VIEW  Comparison: CT chest 03/18/2011  Findings: The heart size is exaggerated by low lung volumes.  Mild pulmonary  vascular congestion is evident.  A dual lead pacemaker is stable.  Mild bibasilar atelectasis is evident.  The visualized soft tissues and bony thorax are otherwise unremarkable.  IMPRESSION:  1.  Low lung volumes with mild bibasilar atelectasis. 2.  Mild pulmonary vascular congestion.   Original Report Authenticated By: Jamesetta Orleans. MATTERN, M.D.     EKG:  Rhythm: Atrially paced Rate: 73 Axis: Left Intervals: Left bundle branch block ST segments: Nonspecific ST changes  1. Severe sepsis   2. UTI (urinary tract infection)   3. Renal insufficiency    MDM  5:29 PM Pt remains hypotensive after 1L NS given by EMS and one in  ED. Additional IVF ordered. Dose zosyn ordered empirically because of this although source of fever unclear at this time. Lactate normal. CXR clear. UA still pending.  6:46 PM Discussed with hospitalist for admission. Severe sepsis with SIRS criteria of fever and leukocytosis of 12,000. UA consistent with UTI. Hypotensive but improving with IVF. Pt's mentation remains fine.         Raeford Razor, MD 01/24/12 [phone]

## 2012-01-24 NOTE — ED Notes (Signed)
Pt is aware of a urine sample, pt tried to void and can't go right now, pt states she will let staff know when she can void

## 2012-01-24 NOTE — ED Notes (Signed)
Pt complain of generalized weakness that started this morning. Denies any other symptoms

## 2012-01-24 NOTE — H&P (Signed)
[name] MRN: [id number] DOB/AGE: 1946/03/21 66 y.o. Primary Care Physician:KNOWLTON,STEPHEN D, MD Admit date: 01/24/2012 Chief Complaint: severe weakness HPI:  This is 66 years female patient with history of multiple medical illness came to ER with the above complaint. She felt very weak this morning and was unable to get out bed. When she was evaluated in R she was found to be febrile and also has abnormal urinalysis. Her B/P was low. She was start on fluid bolus and iv antibiotics. She is then admitted to ICU for further treatment. Past Medical History  Diagnosis Date  . Sleep apnea     uses cpap  . Peripheral vascular disease   . GERD (gastroesophageal reflux disease)   . Neuromuscular disorder     HX of MD   Past Surgical History  Procedure Date  . Insert / replace / remove pacemaker   . Cholecystectomy   . Abdominal hysterectomy   . Tonsillectomy   . Tubal ligation   . Femoral artery stent 02/18/2011  . Esophagogastroduodenoscopy 04/13/2002    A short segment of salmon-colored epithelium in the distal esophagus  consistent with Barrett's esophagus, biopsied/ The remainder of the esophageal mucosa, stomach, and duodenum through the second portion appeared normal  . Colonoscopy 04/13/2002    Diminutive polyps in the rectum cold biopsied/removed/ The remainder of the rectal mucosa and colon appeared normal.        History reviewed. No pertinent family history.  Social History:  reports that she has quit smoking. Her smoking use included Cigarettes. She quit after 50 years of use. She has never used smokeless tobacco. She reports that she does not drink alcohol or use illicit drugs.   Allergies:  Allergies  Allergen Reactions  . Prednisone Other (See Comments)    Chest pain    Medications Prior to Admission  Medication Sig Dispense Refill  . aspirin EC 81 MG tablet Take 81 mg by mouth daily.       Marland Kitchen oxyCODONE-acetaminophen (PERCOCET) 10-325 MG per tablet Take 1  tablet by mouth 4 (four) times daily. *May take one tablet by mouth up to 4 times daily as needed for severe pain*      . pantoprazole (PROTONIX) 40 MG tablet Take 40 mg by mouth daily.      . simvastatin (ZOCOR) 40 MG tablet Take 40 mg by mouth every evening.        Marland Kitchen albuterol (PROVENTIL) (2.5 MG/3ML) 0.083% nebulizer solution Take 2.5 mg by nebulization every 6 (six) hours as needed. Shortness of breath, wheezing           WUJ:WJXBJ from the symptoms mentioned above,there are no other symptoms referable to all systems reviewed.  Physical Exam: Blood pressure 94/55, pulse 94, temperature 101.1 F (38.4 C), temperature source Rectal, resp. rate 13, SpO2 98.00%. General condition- chronically sick looking Chest- good air entry, rhonchi CVS- S1 and S2 heard, no murmur or gallop Abd- soft and lax           Bowel sound + Ext- no leg     Basename 01/24/12 1612  WBC 11.9*  NEUTROABS --  HGB 11.8*  HCT 36.2  MCV 95.0  PLT 179    Basename 01/24/12 1612  NA 139  K 4.6  CL 106  CO2 27  GLUCOSE 123*  BUN 22  CREATININE 1.30*  CALCIUM 8.9  MG --  lablast2(ast:2,ALT:2,alkphos:2,bilitot:2,prot:2,albumin:2)@    Recent Results (from the past 240 hour(s))  MRSA PCR SCREENING  Status: Normal   Collection Time   01/24/12  8:06 PM      Component Value Range Status Comment   MRSA by PCR NEGATIVE  NEGATIVE Final      Dg Chest Portable 1 View  01/24/2012  *RADIOLOGY REPORT*  Clinical Data: Weakness.  PORTABLE CHEST - 1 VIEW  Comparison: CT chest 03/18/2011  Findings: The heart size is exaggerated by low lung volumes.  Mild pulmonary vascular congestion is evident.  A dual lead pacemaker is stable.  Mild bibasilar atelectasis is evident.  The visualized soft tissues and bony thorax are otherwise unremarkable.  IMPRESSION:  1.  Low lung volumes with mild bibasilar atelectasis. 2.  Mild pulmonary vascular congestion.   Original Report Authenticated By: Jamesetta Orleans. MATTERN, M.D.      Impression: Sepsis with hypotension UTI Active Problems:  * No active hospital problems. *      Plan: Admit to ICU Continue fluid replacement IV antibiotics Continue regular treatment.     Shekira Drummer Pager [phone]  01/24/2012, 9:56 PM

## 2012-01-24 NOTE — ED Notes (Signed)
Pt with weakness since this morning, low BP and low sats on arrival

## 2012-01-24 NOTE — ED Notes (Signed)
Pt states that she has not ate anything today

## 2012-01-25 LAB — GLUCOSE, CAPILLARY: Glucose-Capillary: 180 mg/dL — ABNORMAL HIGH (ref 70–99)

## 2012-01-25 MED ORDER — ONDANSETRON HCL 4 MG/2ML IJ SOLN: 4.0000 mg | Freq: Three times a day (TID) | INTRAMUSCULAR | Status: AC | PRN

## 2012-01-25 MED ORDER — CIPROFLOXACIN IN D5W 200 MG/100ML IV SOLN
INTRAVENOUS | Status: AC
  Filled 2012-01-25: qty 200

## 2012-01-25 MED ORDER — ACETAMINOPHEN 325 MG PO TABS
650.0000 mg | ORAL_TABLET | ORAL | Status: DC | PRN
  Administered 2012-01-25 – 2012-01-27 (×5): 650 mg via ORAL
  Filled 2012-01-25 (×5): qty 2

## 2012-01-25 MED ORDER — SODIUM CHLORIDE 0.9 % IV SOLN
INTRAVENOUS | Status: DC
  Administered 2012-01-26 – 2012-01-29 (×4): via INTRAVENOUS

## 2012-01-25 MED ORDER — CIPROFLOXACIN IN D5W 200 MG/100ML IV SOLN
200.0000 mg | Freq: Two times a day (BID) | INTRAVENOUS | Status: DC
  Administered 2012-01-25 – 2012-01-28 (×6): 200 mg via INTRAVENOUS
  Filled 2012-01-25 (×10): qty 100

## 2012-01-25 MED ORDER — SODIUM CHLORIDE 0.9 % IV SOLN
INTRAVENOUS | Status: DC
  Administered 2012-01-24 – 2012-01-26 (×3): via INTRAVENOUS
  Administered 2012-01-26: 125 mL/h via INTRAVENOUS
  Administered 2012-01-27 – 2012-01-30 (×6): via INTRAVENOUS

## 2012-01-25 MED ORDER — OXYCODONE-ACETAMINOPHEN 5-325 MG PO TABS
1.0000 | ORAL_TABLET | ORAL | Status: DC | PRN
  Administered 2012-01-25 – 2012-01-31 (×11): 1 via ORAL
  Filled 2012-01-25 (×12): qty 1

## 2012-01-25 NOTE — Care Management Note (Addendum)
    Page 1 of 2   01/31/2012     10:30:28 AM   CARE MANAGEMENT NOTE 01/31/2012  Patient:  [name], [name]   Account Number:  [id number]  Date Initiated:  01/25/2012  Documentation initiated by:  Sharrie Rothman  Subjective/Objective Assessment:   Pt admitted from home with sepsis and UTI. Pt lives with step son and will return home at discharge. Pt has a walker for prn use.     Action/Plan:   Pt may need HH at discharge. Will continue to follow for [facility] needs.   Anticipated DC Date:  01/28/2012   Anticipated DC Plan:  HOME W HOME HEALTH SERVICES      DC Planning Services  CM consult      [facility] Choice  HOME HEALTH   Choice offered to / List presented to:  C-1 Patient        HH arranged  HH-1 RN  HH-4 NURSE'S AIDE      HH agency  [facility]   Status of service:  Completed, signed off Medicare Important Message given?  YES (If response is "NO", the following Medicare IM given date fields will be blank) Date Medicare IM given:  01/27/2012 Date Additional Medicare IM given:  01/31/2012  Discharge Disposition:  HOME/SELF CARE  Per UR Regulation:    If discussed at Long Length of Stay Meetings, dates discussed:    Comments:  01/31/12 1022 Arlyss Queen, RN BSN CM Pt discharged home today. Pt was set up with [facility] for [facility] but pt has changed her mind and refuses any HH at this time. Alroy Bailiff of [facility] is aware that pt has changed her mind for their services. No DME needs noted. Pt and pts nurse aware of discharge arrangements.  01/30/12 1137 Arlyss Queen, RN BSN CM PT consult today. Pt stated that she does want HH at discharge. Pt will need RN and aide, pt is refusing PT at home. Pt chose [facility] for [facility] and Alroy Bailiff of [facility] is aware and will collect the pts information from the chart.Pt has walker for home use and step son is currently living with pt. Potential discharge 01/31/12.  01/27/12 1153 Arlyss Queen, RN BSN CM Pt is probable discharge on  01/28/12. Pt refuses any HH services. No other CM or HH needs noted.  01/25/12 1527 Arlyss Queen, RN BSN CM

## 2012-01-25 NOTE — Progress Notes (Signed)
[name], [name]                ACCOUNT NO.:  [id number]  MEDICAL RECORD NO.:  [id number]  LOCATION:  [facility]                          FACILITY:  [facility]  PHYSICIAN:  Mila Homer. Sudie Bailey, M.D.DATE OF BIRTH:  29-Apr-1945  DATE OF PROCEDURE: DATE OF DISCHARGE:                                PROGRESS NOTE   SUBJECTIVE:  This 66 year old was admitted to the hospital with severe fatigue, which seemed to be secondary to a urinary tract infection. Admission was last night by Dr. Felecia Shelling, who discussed her case with me today.  She is currently in the ICU.  The main problem she has now is her low back pain.  She has been on oxycodone 10/650 four times a day for this chronic pain.  OBJECTIVE:  VITAL SIGNS:  The temperature is 101.6, pulse 72, respiratory rate 14, blood pressure 107/47. GENERAL:  She is semi-recumbent in bed.  She appears to be alert and oriented. Heart:  regular rhythm.  Rate of 70. LUNGS:  Clear throughout, moving air well. There is no real CVA or flank pain or suprapubic tenderness. ABDOMEN:  Benign.  Her admission white cell count is 11,900.  BUN 22, creatinine 1.30.  ASSESSMENT: 1. Severe malaise secondary to a urinary tract infection. 2. Urinary tract infection, possible urosepsis. 3. Mild dehydration. 4. Chronic low back pain. 5. Adult muscular dystrophy.  PLAN:  She is to continue with IV antibiotics and fluids.  I am putting her back on oxycodone but at a slightly lower dose of 5/325 q.4 hours p.r.n. low back pain.  A urine culture is pending.     Mila Homer. Sudie Bailey, M.D.     SDK/MEDQ  D:  01/25/2012  T:  01/25/2012  Job:  [id number]

## 2012-01-25 NOTE — Progress Notes (Signed)
CRITICAL VALUE ALERT  Critical value received:  Positive blood culture for gram negative rods  Date of notification:  01/25/12  Time of notification:  1750  Critical value read back:yes  Nurse who received alert:  M. Cleburn Maiolo,RN  MD notified (1st page):  Dr Juanetta Gosling  Time of first page:  1755  MD notified (2nd page):  Time of second page:  Responding MD:  Dr Juanetta Gosling  Time MD responded:  34   New order received for IV Cipro 200mg  q 12 hours.

## 2012-01-26 LAB — GLUCOSE, CAPILLARY: Glucose-Capillary: 172 mg/dL — ABNORMAL HIGH (ref 70–99)

## 2012-01-26 MED ORDER — SODIUM CHLORIDE 0.9 % IN NEBU
INHALATION_SOLUTION | RESPIRATORY_TRACT | Status: AC
  Filled 2012-01-26: qty 3

## 2012-01-26 MED ORDER — FLEET ENEMA 7-19 GM/118ML RE ENEM: 1.0000 | ENEMA | Freq: Once | RECTAL | Status: DC

## 2012-01-26 NOTE — Progress Notes (Signed)
Pt to be transferred to room 303 per MD order. Report called to RN. Pt to be transferred via wheelchair with personal belongings.

## 2012-01-26 NOTE — Progress Notes (Signed)
UR Chart Review Completed  

## 2012-01-26 NOTE — Progress Notes (Signed)
[name], [name]                ACCOUNT NO.:  [id number]  MEDICAL RECORD NO.:  [id number]  LOCATION:  [facility]                          FACILITY:  [facility]  PHYSICIAN:  Daysi Boggan G. Renard Matter, MD   DATE OF BIRTH:  1945/08/09  DATE OF PROCEDURE: DATE OF DISCHARGE:                                PROGRESS NOTE   This patient was admitted with generalized fatigue.  She apparently had urinary tract infection, possible sepsis, was hypotensive on admission. An elevated white blood count 11,900.  PHYSICAL EXAMINATION:  GENERAL:  She remains afebrile. She remains alert and oriented. VITAL SIGNS:  Remained stable.  White blood count 11,900. HEART:  Regular rhythm. LUNGS:  Clear to P and A. ABDOMEN:  No palpable organs or masses.  No organomegaly.  ASSESSMENT:  The patient admitted with malaise secondary to urinary tract infection, mild dehydration, chronic low back pain.  PLAN:  To continue current IV antibiotics.  Urine culture is pending.     Regene Mccarthy G. Renard Matter, MD     AGM/MEDQ  D:  01/26/2012  T:  01/26/2012  Job:  [id number]

## 2012-01-26 NOTE — Progress Notes (Signed)
ANTIBIOTIC CONSULT NOTE - INITIAL  Pharmacy Consult for Zosyn Indication: rule out sepsis, UTI  Allergies  Allergen Reactions  . Prednisone Other (See Comments)    Chest pain   Patient Measurements: Height: 5\' 2"  (157.5 cm) Weight: 186 lb 4.6 oz (84.5 kg) IBW/kg (Calculated) : 50.1   Vital Signs: Temp: 98 F (36.7 C) (10/24 0400) Temp src: Axillary (10/24 0400) BP: 117/56 mmHg (10/24 0600) Pulse Rate: 103  (10/24 0600) Intake/Output from previous day: 10/23 0701 - 10/24 0700 In: 3935 [P.O.:810; I.V.:2875; IV Piggyback:250] Out: 1250 [Urine:1250] Intake/Output from this shift:    Labs:  Basename 01/24/12 1612  WBC 11.9*  HGB 11.8*  PLT 179  LABCREA --  CREATININE 1.30*   Estimated Creatinine Clearance: 42.9 ml/min (by C-G formula based on Cr of 1.3). No results found for this basename: VANCOTROUGH:2,VANCOPEAK:2,VANCORANDOM:2,GENTTROUGH:2,GENTPEAK:2,GENTRANDOM:2,TOBRATROUGH:2,TOBRAPEAK:2,TOBRARND:2,AMIKACINPEAK:2,AMIKACINTROU:2,AMIKACIN:2, in the last 72 hours   Microbiology: Recent Results (from the past 720 hour(s))  CULTURE, BLOOD (ROUTINE X 2)     Status: Normal (Preliminary result)   Collection Time   01/24/12  4:15 PM      Component Value Range Status Comment   Specimen Description BLOOD RIGHT ANTECUBITAL   Final    Special Requests BOTTLES DRAWN AEROBIC ONLY 7CC   Final    Culture NO GROWTH 1 DAY   Final    Report Status PENDING   Incomplete   CULTURE, BLOOD (ROUTINE X 2)     Status: Normal (Preliminary result)   Collection Time   01/24/12  4:21 PM      Component Value Range Status Comment   Specimen Description BLOOD LEFT ANTECUBITAL   Final    Special Requests BOTTLES DRAWN AEROBIC AND ANAEROBIC 6CC   Final    Culture     Final    Value: GRAM NEGATIVE RODS     Gram Stain Report Called to,Read Back By and Verified With: TATE,P. AT 1745 ON 01/25/2012 BY BAUGHAM,M.     Performed at [facility]   Report Status PENDING   Incomplete   URINE  CULTURE     Status: Normal (Preliminary result)   Collection Time   01/24/12  5:48 PM      Component Value Range Status Comment   Specimen Description URINE, CATHETERIZED   Final    Special Requests NONE   Final    Culture  Setup Time 01/25/2012 02:29   Final    Colony Count >=100,000 COLONIES/ML   Final    Culture ESCHERICHIA COLI   Final    Report Status PENDING   Incomplete   MRSA PCR SCREENING     Status: Normal   Collection Time   01/24/12  8:06 PM      Component Value Range Status Comment   MRSA by PCR NEGATIVE  NEGATIVE Final    Medical History: Past Medical History  Diagnosis Date  . Sleep apnea     uses cpap  . Peripheral vascular disease   . GERD (gastroesophageal reflux disease)   . Neuromuscular disorder     HX of MD   Medications:  Scheduled:    . albuterol  2.5 mg Nebulization Q6H  . aspirin EC  81 mg Oral Daily  . ciprofloxacin  200 mg Intravenous Q12H  . enoxaparin (LOVENOX) injection  40 mg Subcutaneous Q24H  . pantoprazole  40 mg Oral Daily  . piperacillin-tazobactam (ZOSYN)  IV  3.375 g Intravenous Q8H  . simvastatin  40 mg Oral QPM  . sodium chloride  Assessment: 66yo female admitted with fever and suspected UTI/sepsis.  Estimated Creatinine Clearance: 42.9 ml/min (by C-G formula based on Cr of 1.3).  Goal of Therapy:  Eradicate infection.  Plan: Zosyn 3.375gm iv q8hrs Lovenox 40mg  sq q24hrs for VTE prophylaxis Monitor labs, renal fxn, and cultures per protocol  Margo Aye, Kalissa Grays A 01/26/2012,7:53 AM

## 2012-01-27 LAB — CULTURE, BLOOD (ROUTINE X 2)

## 2012-01-27 LAB — GLUCOSE, CAPILLARY
Glucose-Capillary: 147 mg/dL — ABNORMAL HIGH (ref 70–99)
Glucose-Capillary: 131 mg/dL — ABNORMAL HIGH (ref 70–99)
Glucose-Capillary: 136 mg/dL — ABNORMAL HIGH (ref 70–99)

## 2012-01-27 MED ORDER — SODIUM CHLORIDE 0.9 % IJ SOLN
INTRAMUSCULAR | Status: AC
  Administered 2012-01-27: 3 mL
  Filled 2012-01-27: qty 3

## 2012-01-27 MED ORDER — PIPERACILLIN-TAZOBACTAM 3.375 G IVPB
3.3750 g | Freq: Three times a day (TID) | INTRAVENOUS | Status: DC
  Administered 2012-01-27 – 2012-01-28 (×3): 3.375 g via INTRAVENOUS
  Filled 2012-01-27 (×10): qty 50

## 2012-01-27 NOTE — Progress Notes (Signed)
[name], [name]                ACCOUNT NO.:  [id number]  MEDICAL RECORD NO.:  [id number]  LOCATION:  A303                          FACILITY:  [facility]  PHYSICIAN:  Romaldo Saville G. Renard Matter, MD   DATE OF BIRTH:  10-21-1945  DATE OF PROCEDURE: DATE OF DISCHARGE:                                PROGRESS NOTE   This patient was admitted to the hospital with severe fatigue, had urinary tract infection which turns out to be E. coli, also has had low back pain.  She was moved to medical floor where she appears to be improved.  She remains on IV antibiotics.  OBJECTIVE:  VITAL SIGNS:  Blood pressure 113/72, respirations 16, pulse 70, temp 98.4. HEART:  Regular rhythm. LUNGS:  Clear to P and A. ABDOMEN:  No palpable organs or masses.  ASSESSMENT:  The patient was admitted with malaise secondary to urinary tract infection which cultured E. coli.  She had mild dehydration and chronic low back pain.  PLAN:  To continue current antibiotic regimen.  Continue IV fluids.  She is getting Cipro intravenously 200 mg every 12 hours.     [name] G. Renard Matter, MD     AGM/MEDQ  D:  01/27/2012  T:  01/27/2012  Job:  [id number]

## 2012-01-28 LAB — URINE CULTURE: Colony Count: >=100000

## 2012-01-28 LAB — GLUCOSE, CAPILLARY
Glucose-Capillary: 101 mg/dL — ABNORMAL HIGH (ref 70–99)
Glucose-Capillary: 115 mg/dL — ABNORMAL HIGH (ref 70–99)

## 2012-01-28 MED ORDER — CIPROFLOXACIN HCL 250 MG PO TABS: 250.0000 mg | ORAL_TABLET | Freq: Two times a day (BID) | ORAL | Status: DC

## 2012-01-28 MED ORDER — DEXTROSE 5 % IV SOLN
1.0000 g | INTRAVENOUS | Status: DC
  Administered 2012-01-28 – 2012-01-30 (×3): 1 g via INTRAVENOUS
  Filled 2012-01-28 (×3): qty 10

## 2012-01-28 NOTE — Progress Notes (Signed)
ANTIBIOTIC CONSULT NOTE - INITIAL  Pharmacy Consult for Zosyn Indication: rule out sepsis, UTI  Allergies  Allergen Reactions  . Prednisone Other (See Comments)    Chest pain   Patient Measurements: Height: 5\' 2"  (157.5 cm) Weight: 186 lb 4.6 oz (84.5 kg) IBW/kg (Calculated) : 50.1   Vital Signs: Temp: 97.4 F (36.3 C) (10/26 0620) BP: 132/83 mmHg (10/26 0620) Pulse Rate: 72  (10/26 0620) Intake/Output from previous day: 10/25 0701 - 10/26 0700 In: 7630.5 [P.O.:840; I.V.:6440.5; IV Piggyback:350] Out: 5850 [Urine:5850] Intake/Output from this shift:    Labs: No results found for this basename: WBC:3,HGB:3,PLT:3,LABCREA:3,CREATININE:3 in the last 72 hours Estimated Creatinine Clearance: 42.9 ml/min (by C-G formula based on Cr of 1.3).   Microbiology: Recent Results (from the past 720 hour(s))  CULTURE, BLOOD (ROUTINE X 2)     Status: Normal (Preliminary result)   Collection Time   01/24/12  4:15 PM      Component Value Range Status Comment   Specimen Description BLOOD RIGHT ANTECUBITAL   Final    Special Requests BOTTLES DRAWN AEROBIC ONLY 7CC   Final    Culture NO GROWTH 4 DAYS   Final    Report Status PENDING   Incomplete   CULTURE, BLOOD (ROUTINE X 2)     Status: Normal   Collection Time   01/24/12  4:21 PM      Component Value Range Status Comment   Specimen Description BLOOD LEFT ANTECUBITAL   Final    Special Requests BOTTLES DRAWN AEROBIC AND ANAEROBIC [facility]   Final    Culture  Setup Time 01/26/2012 01:05   Final    Culture     Final    Value: ESCHERICHIA COLI     Note: Gram Stain Report Called to,Read Back By and Verified With:  TATE P @17 :45 ON 01/25/2012 BY BAUGHAM  M.  Performed at [facility]   Report Status 01/27/2012 FINAL   Final    Organism ID, Bacteria ESCHERICHIA COLI   Final   URINE CULTURE     Status: Normal   Collection Time   01/24/12  5:48 PM      Component Value Range Status Comment   Specimen Description URINE, CATHETERIZED    Final    Special Requests NONE   Final    Culture  Setup Time 01/25/2012 02:29   Final    Colony Count >=100,000 COLONIES/ML   Final    Culture     Final    Value: ESCHERICHIA COLI     ENTEROBACTER CLOACAE   Report Status 01/28/2012 FINAL   Final    Organism ID, Bacteria ESCHERICHIA COLI   Final    Organism ID, Bacteria ENTEROBACTER CLOACAE   Final   MRSA PCR SCREENING     Status: Normal   Collection Time   01/24/12  8:06 PM      Component Value Range Status Comment   MRSA by PCR NEGATIVE  NEGATIVE Final    Medical History: Past Medical History  Diagnosis Date  . Sleep apnea     uses cpap  . Peripheral vascular disease   . GERD (gastroesophageal reflux disease)   . Neuromuscular disorder     HX of MD   Medications:  Scheduled:     . albuterol  2.5 mg Nebulization Q6H  . aspirin EC  81 mg Oral Daily  . ciprofloxacin  200 mg Intravenous Q12H  . enoxaparin (LOVENOX) injection  40 mg Subcutaneous Q24H  . pantoprazole  40 mg Oral Daily  . piperacillin-tazobactam (ZOSYN)  IV  3.375 g Intravenous Q8H  . simvastatin  40 mg Oral QPM  . sodium chloride      . sodium phosphate  1 enema Rectal Once  . DISCONTD: piperacillin-tazobactam (ZOSYN)  IV  3.375 g Intravenous Q8H   Assessment: 66yo female admitted with fever and suspected UTI/sepsis.  Estimated Creatinine Clearance: 42.9 ml/min (by C-G formula based on Cr of 1.3).  Enterobacter/EColi UTI + EColi Bacteremia (both sensitive to Rocephin/Cipro/Levaquin/Zosyn/Bactrim/Gentamicin)  Goal of Therapy:  Eradicate infection.  Plan: No change required.  Please consider narrowing Antibiotics. (i.e. Rocephin 1gm IV every 24 hours).  PHARMACIST - PHYSICIAN COMMUNICATION DR:   Renard Matter  CONCERNING: Antibiotic IV to Oral Route Change Policy  RECOMMENDATION: This patient is receiving Cipro by the intravenous route.  Based on criteria approved by the Pharmacy and Therapeutics Committee, the antibiotic(s) is/are being converted to the  equivalent oral dose form(s).   DESCRIPTION: These criteria include:  Patient being treated for a respiratory tract infection, urinary tract infection, or cellulitis  The patient is not neutropenic and does not exhibit a GI malabsorption state  The patient is eating (either orally or via tube) and/or has been taking other orally administered medications for a least 24 hours  The patient is improving clinically and has a Tmax < 100.5  If you have questions about this conversion, please contact the Pharmacy Department  [x]   [phone] )  [facility] []   [phone] )  [facility]  []   [phone] )  [facility] []   [phone] )  [facility]   Lamonte Richer R 01/28/2012,8:35 AM

## 2012-01-28 NOTE — Progress Notes (Signed)
NAMEJANAYSHA, [name]                ACCOUNT NO.:  [id number]  MEDICAL RECORD NO.:  [id number]  LOCATION:  A303                          FACILITY:  [facility]  PHYSICIAN:  Makiyla Linch G. Renard Matter, MD   DATE OF BIRTH:  1945-11-26  DATE OF PROCEDURE: DATE OF DISCHARGE:                                PROGRESS NOTE   This patient has urinary tract infection, E. coli.  She remains on intravenous Cipro, is afebrile and feeling some better.  OBJECTIVE:  VITAL SIGNS:  Blood pressure 115/48, respirations 24, pulse 66, temp 97.9. LUNGS:  Clear to P and A except for occasional rhonchus heard over lower lung field. HEART:  Regular rhythm. ABDOMEN:  No palpable organs or masses.  ASSESSMENT:  The patient has urinary tract infection, Escherichia coli has improved.  We will continue current IV and antibiotic regimen.     Tobe Kervin G. Renard Matter, MD     AGM/MEDQ  D:  01/28/2012  T:  01/28/2012  Job:  [id number]

## 2012-01-28 NOTE — Plan of Care (Signed)
Problem: Phase II Progression Outcomes Goal: Progress activity as tolerated unless otherwise ordered Outcome: Completed/Met Date Met:  01/28/12 01/28/12 1554 Patient up in room to chair as tolerated, ambulated in hallway with nurse tech this afternoon. Tolerated well, some general weakness. Up to chair with chair alarm on for safety, instructed to call for assist and not get up on her own. Stated understood and would call.

## 2012-01-29 LAB — GLUCOSE, CAPILLARY
Glucose-Capillary: 118 mg/dL — ABNORMAL HIGH (ref 70–99)
Glucose-Capillary: 107 mg/dL — ABNORMAL HIGH (ref 70–99)

## 2012-01-29 LAB — CULTURE, BLOOD (ROUTINE X 2): Culture: NO GROWTH

## 2012-01-29 NOTE — Progress Notes (Signed)
NAMEZENOVIA, JUSTMAN                ACCOUNT NO.:  [id number]  MEDICAL RECORD NO.:  [id number]  LOCATION:  A303                          FACILITY:  [facility]  PHYSICIAN:  Nimah Uphoff G. Renard Matter, MD   DATE OF BIRTH:  Oct 19, 1945  DATE OF PROCEDURE: DATE OF DISCHARGE:                                PROGRESS NOTE   This patient continues to improve.  She was admitted with urinary tract infection E. coli.  She remains on IV Cipro and remains afebrile. Continues to feel some better.  OBJECTIVE:  VITAL SIGNS:  Blood pressure 144/80, respirations 20, pulse 73, temp 98.2. LUNGS:  Clear to P and A with exception of occasional rhonchus heard over lower lung fields. HEART:  Regular rhythm. ABDOMEN:  No palpable organs or masses.  ASSESSMENT:  The patient does have urinary tract infection secondary to Escherichia coli, was felt possibly to be septic initially, but is much better now.  She will continue the IV antibiotic regimen.  Continue same medications.  We will attempt to ambulate the patient some today. Anticipate her being discharged fairly soon.     Rohaan Durnil G. Renard Matter, MD     AGM/MEDQ  D:  01/29/2012  T:  01/29/2012  Job:  [id number]

## 2012-01-30 LAB — GLUCOSE, CAPILLARY
Glucose-Capillary: 113 mg/dL — ABNORMAL HIGH (ref 70–99)
Glucose-Capillary: 114 mg/dL — ABNORMAL HIGH (ref 70–99)
Glucose-Capillary: 146 mg/dL — ABNORMAL HIGH (ref 70–99)

## 2012-01-30 MED ORDER — SODIUM CHLORIDE 0.9 % IJ SOLN
INTRAMUSCULAR | Status: AC
  Administered 2012-01-30: 3 mL
  Filled 2012-01-30: qty 3

## 2012-01-30 MED ORDER — SODIUM CHLORIDE 0.9 % IJ SOLN
3.0000 mL | Freq: Two times a day (BID) | INTRAMUSCULAR | Status: DC
  Administered 2012-01-30 (×2): 3 mL via INTRAVENOUS
  Filled 2012-01-30 (×3): qty 3

## 2012-01-30 NOTE — Progress Notes (Signed)
NAMECHARNEA, [name]                ACCOUNT NO.:  [id number]  MEDICAL RECORD NO.:  [id number]  LOCATION:  A303                          FACILITY:  [facility]  PHYSICIAN:  Mila Homer. Sudie Bailey, M.D.DATE OF BIRTH:  May 03, 1945  DATE OF PROCEDURE: DATE OF DISCHARGE:                                PROGRESS NOTE   SUBJECTIVE:  The patient feels better but still very weak.  OBJECTIVE:  GENERAL:  She is supine in bed.  She is alert and oriented. VITAL SIGNS:  Temperature is 97.6, pulse 69, respiratory rate 16, blood pressure 117/68. ABDOMEN:  She has no CVA or flank pain.  No suprapubic tenderness.  Her abdomen is soft without organomegaly or mass. HEART:  Heart has a regular rhythm.  Rate of 80. LUNGS:  Lungs are clear throughout.  She is moving air well.  She grew out 2 bacteria, both Enterobacter cloacae and E coli.  ASSESSMENT: 1. Urinary tract infection. 2. Escherichia coli infection. 3. Enterobacter cloacae. 4. Adult muscular dystrophy. 5. Mild dehydration, cleared.  PLAN:  Her IV was discontinued today.  I am switching her to a saline lock, getting physical therapy to get her up and around.  I talked to discharge planning.  We will get home health to see her when she gets discharged, hopefully tomorrow.     Mila Homer. Sudie Bailey, M.D.     SDK/MEDQ  D:  01/30/2012  T:  01/30/2012  Job:  [id number]

## 2012-01-30 NOTE — Evaluation (Signed)
Physical Therapy Evaluation Patient Details Name: [name] MRN: [id number] DOB: 11-24-1945 Today's Date: 01/30/2012 Time: 4098-1191    PT Assessment / Plan / Recommendation Clinical Impression  Pt was seen for eval and found to be mildly deconditioned, but not too far off of prior functional status.  Recommend HHPT to eval home safety.    PT Assessment  Patient needs continued PT services    Follow Up Recommendations  Home health PT    Does the patient have the potential to tolerate intense rehabilitation      Barriers to Discharge None      Equipment Recommendations  None recommended by PT    Recommendations for Other Services     Frequency Min 3X/week    Precautions / Restrictions Precautions Precautions: None Restrictions Weight Bearing Restrictions: No   Pertinent Vitals/Pain       Mobility  Bed Mobility Bed Mobility: Supine to Sit;Sit to Supine Supine to Sit: 6: Modified independent (Device/Increase time);HOB flat Sit to Supine: 6: Modified independent (Device/Increase time);HOB flat Transfers Transfers: Sit to Stand;Stand to Sit Sit to Stand: 6: Modified independent (Device/Increase time) Stand to Sit: 6: Modified independent (Device/Increase time) Ambulation/Gait Ambulation/Gait Assistance: 6: Modified independent (Device/Increase time) Ambulation Distance (Feet): 150 Feet Assistive device: Rolling walker Ambulation/Gait Assistance Details: stable gait Gait Pattern: Within Functional Limits General Gait Details: pt normally ambulates with a walker outside of the home, furniture walks inside Stairs: No    Shoulder Instructions     Exercises General Exercises - Lower Extremity Ankle Circles/Pumps: AROM;Both;5 reps;Supine Heel Slides: AROM;Both;5 reps;Supine Hip ABduction/ADduction: AROM;Both;5 reps;Supine Straight Leg Raises: AROM;Both;5 reps;Supine   PT Diagnosis: Generalized weakness  PT Problem List: Decreased strength;Decreased activity  tolerance;Decreased mobility PT Treatment Interventions: Gait training;Therapeutic exercise   PT Goals Acute Rehab PT Goals PT Goal Formulation: With patient Time For Goal Achievement: 02/06/12 Potential to Achieve Goals: Good Pt will Ambulate: >150 feet;with modified independence;with rolling walker PT Goal: Ambulate - Progress: Goal set today  Visit Information  Last PT Received On: 01/30/12    Subjective Data  Subjective: I had been really sick, but feeling better now Patient Stated Goal: return home   Prior Functioning  Home Living Lives With: Family Available Help at Discharge: Family;Available 24 hours/day Type of Home: House Home Access: Ramped entrance Home Layout: One level Bathroom Shower/Tub: Health visitor: Standard Home Adaptive Equipment: Bedside commode/3-in-1;Walker - rolling;Shower chair without back Prior Function Level of Independence: Independent Able to Take Stairs?: No Driving: Yes Vocation: Retired Musician: No difficulties    Cognition  Overall Cognitive Status: Appears within functional limits for tasks assessed/performed Arousal/Alertness: Awake/alert Orientation Level: Appears intact for tasks assessed Behavior During Session: [facility] for tasks performed    Extremity/Trunk Assessment Right Lower Extremity Assessment RLE ROM/Strength/Tone: WFL for tasks assessed RLE Sensation: WFL - Light Touch RLE Coordination: WFL - gross motor Left Lower Extremity Assessment LLE ROM/Strength/Tone: WFL for tasks assessed LLE Sensation: WFL - Light Touch LLE Coordination: WFL - gross motor Trunk Assessment Trunk Assessment: Normal   Balance Balance Balance Assessed: No  End of Session PT - End of Session Equipment Utilized During Treatment: Gait belt Activity Tolerance: Patient tolerated treatment well Patient left: in chair;with call bell/phone within reach;with chair alarm set Nurse Communication: Mobility status    GP     Konrad Penta 01/30/2012, 1:37 PM

## 2012-01-31 MED ORDER — CIPROFLOXACIN HCL 500 MG PO TABS: 500.0000 mg | ORAL_TABLET | Freq: Two times a day (BID) | ORAL | Status: DC

## 2012-01-31 NOTE — Progress Notes (Signed)
Pt discharged home today per Dr. Sudie Bailey. Pt's IV site D/C'd and WNL. Pt's VS stable at this time. Pt provided with home medication list, discharge instructions and prescriptions. Pt verbalized understanding. Pt made aware of follow up appointment already in place with Dr. Sudie Bailey. Pt verbalized understanding. Pt left floor via WC in stable condition accompanied by NT.

## 2012-02-01 NOTE — Discharge Summary (Signed)
[name], [name]                ACCOUNT NO.:  [id number]  MEDICAL RECORD NO.:  [id number]  LOCATION:  A303                          FACILITY:  [facility]  PHYSICIAN:  Mila Homer. Sudie Bailey, M.D.DATE OF BIRTH:  08-20-1945  DATE OF ADMISSION:  01/24/2012 DATE OF DISCHARGE:  LH                              DISCHARGE SUMMARY   This 66 year old was admitted to the hospital.  It turned out to be a urinary tract infection.  She had a fairly benign 8-day hospital course extending from October 22nd to January 31, 2012.  Vital signs remained stable.  Her admission white cell count was 11,900, hemoglobin 11.8.  She had a BUN of 22, creatinine 1.30.  Her lactic acid was 0.3 and troponin less than 0.30.  Her sugar was 123.  Admission chest x-ray showed mild pulmonary vascular congestion.  Urinalysis showed too numerous to count WBCs with 3-6 RBCs and many bacteria.  The urine did grow out greater than 100,000 E coli sensitive to everything and greater 100,000 Enterobacter cloacae which was sensitive to everything except for cefazolin and nitrofurantoin.  The urine was a cath urine.  She was admitted to the hospital, kept on her home meds of pantoprazole, simvastatin, acetaminophen, oxycodone, and aspirin.  She was given ceftriaxone 1 g IV daily.  She gradually defervesced and improved.  Her strength came back.  She was able to get up and walk by her 7th day and will be discharged to home by her 8th day.  Home Health was offered but she declined this.  FINAL DISCHARGE DIAGNOSES: 1. Urine tract infection. 2. Escherichia coli infection. 3. Enterobacter coli infection. 4. Adult muscular dystrophy. 5. Mild dehydration, cleared. 6. Hyperlipidemia. 7. Reflux esophagitis. 8. Peripheral vascular disease.  She was discharged on:  1. Aspirin 81 mg daily. 2. Oxycodone/APAP 10/325 every 6 hours for pain. 3. Pantoprazole 40 mg daily. 4. Simvastatin 40 mg daily. 5. Ciprofloxacin 500 mg b.i.d. for  a 10-day course.  Followup will be in the office within the next week.     Mila Homer. Sudie Bailey, M.D.     SDK/MEDQ  D:  01/31/2012  T:  02/01/2012  Job:  [id number]

## 2012-05-27 ENCOUNTER — Emergency Department (HOSPITAL_COMMUNITY)
Admission: EM | Admit: 2012-05-27 | Discharge: 2012-05-27 | Disposition: A | Discharge status: Home / Self Care | Payer: Medicare Other | Attending: Emergency Medicine | Admitting: Emergency Medicine

## 2012-05-27 ENCOUNTER — Emergency Department (HOSPITAL_COMMUNITY): Payer: Medicare Other

## 2012-05-27 ENCOUNTER — Encounter (HOSPITAL_COMMUNITY): Payer: Self-pay | Admitting: Emergency Medicine

## 2012-05-27 DIAGNOSIS — K5900 Constipation, unspecified: Secondary | ICD-10-CM | POA: Insufficient documentation

## 2012-05-27 DIAGNOSIS — Z90710 Acquired absence of both cervix and uterus: Secondary | ICD-10-CM | POA: Insufficient documentation

## 2012-05-27 DIAGNOSIS — Z9851 Tubal ligation status: Secondary | ICD-10-CM | POA: Insufficient documentation

## 2012-05-27 DIAGNOSIS — Z79899 Other long term (current) drug therapy: Secondary | ICD-10-CM | POA: Insufficient documentation

## 2012-05-27 DIAGNOSIS — Z9089 Acquired absence of other organs: Secondary | ICD-10-CM | POA: Insufficient documentation

## 2012-05-27 DIAGNOSIS — Z8669 Personal history of other diseases of the nervous system and sense organs: Secondary | ICD-10-CM | POA: Insufficient documentation

## 2012-05-27 DIAGNOSIS — K219 Gastro-esophageal reflux disease without esophagitis: Secondary | ICD-10-CM | POA: Insufficient documentation

## 2012-05-27 DIAGNOSIS — G4730 Sleep apnea, unspecified: Secondary | ICD-10-CM | POA: Insufficient documentation

## 2012-05-27 DIAGNOSIS — Z7982 Long term (current) use of aspirin: Secondary | ICD-10-CM | POA: Insufficient documentation

## 2012-05-27 DIAGNOSIS — Z87891 Personal history of nicotine dependence: Secondary | ICD-10-CM | POA: Insufficient documentation

## 2012-05-27 DIAGNOSIS — R1031 Right lower quadrant pain: Secondary | ICD-10-CM | POA: Insufficient documentation

## 2012-05-27 DIAGNOSIS — Z8679 Personal history of other diseases of the circulatory system: Secondary | ICD-10-CM | POA: Insufficient documentation

## 2012-05-27 LAB — COMPREHENSIVE METABOLIC PANEL
ALT: 14 U/L (ref 0–35)
AST: 16 U/L (ref 0–37)
Albumin: 3.5 g/dL (ref 3.5–5.2)
CO2: 27 mEq/L (ref 19–32)
Calcium: 9.7 mg/dL (ref 8.4–10.5)
Creatinine, Ser: 0.94 mg/dL (ref 0.50–1.10)
Sodium: 141 mEq/L (ref 135–145)

## 2012-05-27 LAB — URINALYSIS, ROUTINE W REFLEX MICROSCOPIC
Bilirubin Urine: NEGATIVE
Glucose, UA: NEGATIVE mg/dL
Hgb urine dipstick: NEGATIVE
Specific Gravity, Urine: <1.005 — ABNORMAL LOW (ref 1.005–1.030)
pH: 5.5 (ref 5.0–8.0)

## 2012-05-27 LAB — URINE MICROSCOPIC-ADD ON

## 2012-05-27 LAB — CBC WITH DIFFERENTIAL/PLATELET
Basophils Absolute: 0.1 10*3/uL (ref 0.0–0.1)
Basophils Relative: 1 % (ref 0–1)
Eosinophils Relative: 3 % (ref 0–5)
Hemoglobin: 12.7 g/dL (ref 12.0–15.0)
Lymphs Abs: 1.9 10*3/uL (ref 0.7–4.0)
MCH: 29.1 pg (ref 26.0–34.0)
Monocytes Absolute: 0.7 10*3/uL (ref 0.1–1.0)
Monocytes Relative: 8 % (ref 3–12)
Neutrophils Relative %: 66 % (ref 43–77)
RBC: 4.37 MIL/uL (ref 3.87–5.11)

## 2012-05-27 MED ORDER — ONDANSETRON HCL 4 MG/2ML IJ SOLN
4.0000 mg | Freq: Once | INTRAMUSCULAR | Status: AC
  Administered 2012-05-27: 4 mg via INTRAVENOUS
  Filled 2012-05-27: qty 2

## 2012-05-27 MED ORDER — HYDROMORPHONE HCL PF 1 MG/ML IJ SOLN
0.5000 mg | Freq: Once | INTRAMUSCULAR | Status: AC
  Administered 2012-05-27: 0.5 mg via INTRAVENOUS
  Filled 2012-05-27: qty 1

## 2012-05-27 MED ORDER — IOHEXOL 300 MG/ML  SOLN
100.0000 mL | Freq: Once | INTRAMUSCULAR | Status: AC | PRN
  Administered 2012-05-27: 100 mL via INTRAVENOUS

## 2012-05-27 MED ORDER — IOHEXOL 300 MG/ML  SOLN
50.0000 mL | Freq: Once | INTRAMUSCULAR | Status: AC | PRN
  Administered 2012-05-27: 50 mL via ORAL

## 2012-05-27 NOTE — ED Provider Notes (Signed)
History    This chart was scribed for Melinda Lennert, MD by [name], ED Scribe. The patient was seen in room APA17/APA17 and the patient's care was started at 5:07PM.    CSN: [id number]  Arrival date & time 05/27/12  1630   First MD Initiated Contact with Patient 05/27/12 1657      Chief Complaint  Patient presents with  . Constipation  . Abdominal Pain    (Consider location/radiation/quality/duration/timing/severity/associated sxs/prior treatment) Patient is a 67 y.o. female presenting with constipation and abdominal pain. The history is provided by the patient. No language interpreter was used.  Constipation  The current episode started 3 to 5 days ago. The onset was gradual. The problem occurs continuously. The problem has been unchanged. The pain is moderate. The stool is described as hard. There was no prior successful therapy. Prior unsuccessful therapies include stool softeners. Associated symptoms include abdominal pain. Pertinent negatives include no fever, no diarrhea, no nausea and no vomiting.  Abdominal Pain Associated symptoms: constipation   Associated symptoms: no diarrhea, no fever, no nausea and no vomiting    [name] is a 67 y.o. female who presents to the Emergency Department complaining of persistent, moderate to severe constipation with associated right lower abdominal pain since 3-4 days ago. She reports that she has had to strain herself to defecate and get her hard stools out. Taking stool softeners and drinking apple juice did not alleviate the constipation or pain. She does not know if she has had an appendectomy in the past. She reports she has a pacemaker, history of ruptured discs, and muscular dystrophy (uses a walker at baseline). Allergic to prednisone. No other pertinent medical symptoms.  Past Medical History  Diagnosis Date  . Sleep apnea     uses cpap  . Peripheral vascular disease   . GERD (gastroesophageal reflux disease)   .  Neuromuscular disorder     HX of MD    Past Surgical History  Procedure Laterality Date  . Insert / replace / remove pacemaker    . Cholecystectomy    . Abdominal hysterectomy    . Tonsillectomy    . Tubal ligation    . Femoral artery stent  02/18/2011  . Esophagogastroduodenoscopy  04/13/2002    A short segment of salmon-colored epithelium in the distal esophagus  consistent with Barrett's esophagus, biopsied/ The remainder of the esophageal mucosa, stomach, and duodenum through the second portion appeared normal  . Colonoscopy  04/13/2002    Diminutive polyps in the rectum cold biopsied/removed/ The remainder of the rectal mucosa and colon appeared normal.    No family history on file.  History  Substance Use Topics  . Smoking status: Former Smoker -- 50 years    Types: Cigarettes  . Smokeless tobacco: Never Used  . Alcohol Use: No    OB History   Grav Para Term Preterm Abortions TAB SAB Ect Mult Living                  Review of Systems  Constitutional: Negative for fever.  Gastrointestinal: Positive for abdominal pain and constipation. Negative for nausea, vomiting and diarrhea.  All other systems reviewed and are negative.   Allergies  Prednisone  Home Medications   Current Outpatient Rx  Name  Route  Sig  Dispense  Refill  . albuterol (PROVENTIL) (2.5 MG/3ML) 0.083% nebulizer solution   Nebulization   Take 2.5 mg by nebulization every 6 (six) hours as needed. Shortness of  breath, wheezing         . aspirin EC 81 MG tablet   Oral   Take 81 mg by mouth daily.          . ciprofloxacin (CIPRO) 500 MG tablet   Oral   Take 1 tablet (500 mg total) by mouth 2 (two) times daily.   20 tablet   1   . oxyCODONE-acetaminophen (PERCOCET) 10-325 MG per tablet   Oral   Take 1 tablet by mouth every 6 (six) hours as needed. *May take one tablet by mouth up to 4 times daily as needed for severe pain*         . pantoprazole (PROTONIX) 40 MG tablet   Oral    Take 40 mg by mouth daily.         . simvastatin (ZOCOR) 40 MG tablet   Oral   Take 40 mg by mouth every evening.             BP 107/66  Pulse 75  Temp(Src) 98.6 F (37 C) (Oral)  Resp 18  Ht 5\' 1"  (1.549 m)  Wt 162 lb (73.483 kg)  BMI 30.63 kg/m2  SpO2 94%  Physical Exam  Nursing note and vitals reviewed. Constitutional: She is oriented to person, place, and time. She appears well-developed.  HENT:  Head: Normocephalic and atraumatic.  Eyes: Conjunctivae and EOM are normal. No scleral icterus.  Neck: Neck supple. No thyromegaly present.  Cardiovascular: Normal rate and regular rhythm.  Exam reveals no gallop and no friction rub.   No murmur heard. Pulmonary/Chest: No stridor. She has no wheezes. She has no rales. She exhibits no tenderness.  Abdominal: Soft. Bowel sounds are normal. She exhibits no distension. There is tenderness. There is no rebound.  Moderate RLQ tenderness.  Musculoskeletal: Normal range of motion. She exhibits no edema.  Lymphadenopathy:    She has no cervical adenopathy.  Neurological: She is oriented to person, place, and time. Coordination normal.  Skin: No rash noted. No erythema.  Psychiatric: She has a normal mood and affect. Her behavior is normal.    ED Course  Procedures (including critical care time)  DIAGNOSTIC STUDIES: Oxygen Saturation is 94% on room air, adequate by my interpretation.    COORDINATION OF CARE:  5:11PM - Zofran, dilaudid, abd/pelvis CT with contrast, CMP, UA, CBC with differential will be ordered for [name].   Labs Reviewed - No data to display No results found.   No diagnosis found.    MDM   The chart was scribed for me under my direct supervision.  I personally performed the history, physical, and medical decision making and all procedures in the evaluation of this patient.Melinda Lennert, MD 05/27/12 2022

## 2012-05-27 NOTE — ED Notes (Signed)
States that she is having right lower abdominal pain and has been unable to have a bowel movement for 3 days.  States that she usually has problems with constipation due to her medications, however this pain is different to her.

## 2012-05-27 NOTE — ED Notes (Signed)
Placed BSC in patients room at patient request.

## 2012-05-27 NOTE — ED Notes (Signed)
Pt alert & oriented x4. Patient given discharge instructions, paperwork & prescription(s). Patient verbalized understanding. Pt left department w/ no further questions.  

## 2012-05-27 NOTE — ED Notes (Signed)
Let Nurse Know three times Patient Needed To go to Restroom

## 2012-05-27 NOTE — ED Notes (Signed)
Patient ambulatory with assistance to restroom to collect urine sample

## 2012-05-27 NOTE — ED Notes (Signed)
Pt sitting on edge of stretcher drinking contrast. No c/o voiced.

## 2012-07-23 ENCOUNTER — Encounter: Payer: Self-pay | Admitting: *Deleted

## 2012-09-19 ENCOUNTER — Telehealth (HOSPITAL_COMMUNITY): Payer: Self-pay | Admitting: Cardiovascular Disease

## 2012-09-19 DIAGNOSIS — I739 Peripheral vascular disease, unspecified: Secondary | ICD-10-CM

## 2012-09-19 NOTE — Telephone Encounter (Signed)
Order placed for Lower extremity arterial duplex per last OV note (pt missed appt).

## 2012-09-19 NOTE — Telephone Encounter (Signed)
Pt called stating that she needs to have doppler's done. Can you please put in an order if she needs this and let me know so that I can call to schedule it. Thank you

## 2012-09-20 NOTE — Telephone Encounter (Signed)
Returned call and informed pt LEA duplex ordered and she will get a call from the schedulers to set up appt.  Pt verbalized understanding and agreed w/ plan.

## 2012-09-26 ENCOUNTER — Encounter: Payer: Self-pay | Admitting: Cardiovascular Disease

## 2012-10-01 ENCOUNTER — Encounter (HOSPITAL_COMMUNITY): Payer: Medicare Other

## 2012-10-03 ENCOUNTER — Ambulatory Visit (HOSPITAL_COMMUNITY)
Admission: RE | Admit: 2012-10-03 | Discharge: 2012-10-03 | Disposition: A | Discharge status: Home / Self Care | Payer: Medicare Other | Source: Ambulatory Visit | Attending: Cardiovascular Disease | Admitting: Cardiovascular Disease

## 2012-10-03 DIAGNOSIS — I739 Peripheral vascular disease, unspecified: Secondary | ICD-10-CM | POA: Insufficient documentation

## 2012-10-03 NOTE — Progress Notes (Signed)
Lower Extremity Arterial Duplex Completed. °Brianna L Mazza ° °

## 2012-10-18 ENCOUNTER — Ambulatory Visit (INDEPENDENT_AMBULATORY_CARE_PROVIDER_SITE_OTHER): Payer: Medicare Other | Admitting: Cardiovascular Disease

## 2012-10-18 ENCOUNTER — Encounter: Payer: Self-pay | Admitting: Cardiovascular Disease

## 2012-10-18 VITALS — BP 122/74 | HR 73 | Ht 61.0 in | Wt 162.0 lb

## 2012-10-18 DIAGNOSIS — I739 Peripheral vascular disease, unspecified: Secondary | ICD-10-CM

## 2012-10-18 DIAGNOSIS — I495 Sick sinus syndrome: Secondary | ICD-10-CM

## 2012-10-18 DIAGNOSIS — R011 Cardiac murmur, unspecified: Secondary | ICD-10-CM

## 2012-10-18 DIAGNOSIS — R5381 Other malaise: Secondary | ICD-10-CM

## 2012-10-18 DIAGNOSIS — D689 Coagulation defect, unspecified: Secondary | ICD-10-CM

## 2012-10-18 DIAGNOSIS — Z79899 Other long term (current) drug therapy: Secondary | ICD-10-CM

## 2012-10-18 DIAGNOSIS — Z01818 Encounter for other preprocedural examination: Secondary | ICD-10-CM

## 2012-10-18 DIAGNOSIS — R5383 Other fatigue: Secondary | ICD-10-CM

## 2012-10-18 NOTE — Patient Instructions (Addendum)
Dr. Allyson Sabal has ordered a peripheral angiogram to be done at [facility].  This procedure is going to look at the bloodflow in your lower extremities.  If Dr. Allyson Sabal is able to open up the arteries, you will have to spend one night in the hospital.  If he is not able to open the arteries, you will be able to go home that same day.    After the procedure, you will not be allowed to drive for 3 days or push, pull, or lift anything greater than 10 lbs for one week.    You will be required to have bloodwork prior to your procedure.  Our scheduler will advise you on when these items need to be done.     Left groin stick  Reps: Lorin Picket and Delice Bison    Also, Dr Allyson Sabal has ordered an echocardiogram to be done

## 2012-10-18 NOTE — Progress Notes (Signed)
10/18/2012 [name]   [birth year]  [id number]  Primary Physician Melinda Obey, MD Primary Cardiologist: Melinda Gess MD [name]   HPI:  [name] is a 67 year old Caucasian female patient of Dr. Erin [name]  with a history of paroxysmal A. Fib status post remote pacemaker implantation. He follows her for this. She has cardiovascular disease status post left SFA PTA and stenting for claudication with residual right SFA disease. This has progressed. She now has left a limiting claudication. She also has hyperlipidemia on statin therapy. Recent Dopplers performed 10/03/12 suggest occlusion of her right SFA with in-stent restenosis on the left side. Patient will require angiography and re\re intervention. I have stressed the importance of smoking cessation.   Current Outpatient Prescriptions  Medication Sig Dispense Refill  . albuterol (PROVENTIL) (2.5 MG/3ML) 0.083% nebulizer solution Take 2.5 mg by nebulization every 6 (six) hours as needed. Shortness of breath, wheezing      . aspirin EC 81 MG tablet Take 81 mg by mouth daily.       Marland Kitchen docusate sodium (COLACE) 100 MG capsule Take 100 mg by mouth 3 (three) times daily as needed for constipation.      Marland Kitchen oxyCODONE-acetaminophen (PERCOCET) 10-325 MG per tablet Take 1 tablet by mouth every 6 (six) hours as needed. *May take one tablet by mouth up to 4 times daily as needed for severe pain*      . pantoprazole (PROTONIX) 40 MG tablet Take 40 mg by mouth daily.      . simvastatin (ZOCOR) 40 MG tablet Take 40 mg by mouth every evening.         No current facility-administered medications for this visit.    Allergies  Allergen Reactions  . Prednisone Other (See Comments)    Chest pain    History   Social History  . Marital Status: Married    Spouse Name: N/A    Number of Children: N/A  . Years of Education: N/A   Occupational History  . Not on file.   Social History Main Topics  . Smoking status: Former  Smoker -- 50 years    Types: Cigarettes  . Smokeless tobacco: Never Used  . Alcohol Use: No  . Drug Use: No  . Sexually Active:    Other Topics Concern  . Not on file   Social History Narrative  . No narrative on file     Review of Systems: General: negative for chills, fever, night sweats or weight changes.  Cardiovascular: negative for chest pain, dyspnea on exertion, edema, orthopnea, palpitations, paroxysmal nocturnal dyspnea or shortness of breath Dermatological: negative for rash Respiratory: negative for cough or wheezing Urologic: negative for hematuria Abdominal: negative for nausea, vomiting, diarrhea, bright red blood per rectum, melena, or hematemesis Neurologic: negative for visual changes, syncope, or dizziness All other systems reviewed and are otherwise negative except as noted above.    Blood pressure 122/74, pulse 73, height 5\' 1"  (1.549 m), weight 162 lb (73.483 kg).  General appearance: alert and no distress Neck: no adenopathy, no carotid bruit, no JVD, supple, symmetrical, trachea midline and thyroid not enlarged, symmetric, no tenderness/mass/nodules Lungs: clear to auscultation bilaterally Heart: 2/6 systolic ejection murmur Extremities: extremities normal, atraumatic, no cyanosis or edema and absent right pedal pulses, 1+ left pedal pulse  EKG AV sequentially paced rhythm  ASSESSMENT AND PLAN:   Claudication in peripheral vascular disease Patient is status post left SFA PTA and stenting by myself on 02/18/11. She did have  a 60% segmental mid right SFA stenosis with three-vessel runoff bilaterally. She complains of lifestyle limiting claudication. Recent lower Dopplers reveal a decrease in her right ABI 0.7 with an occluded mid right SFA. She has a high-frequency signal in her mid left SFA suggesting "in-stent restenosis. She presents now for angiography and potential percutaneous intervention of her right SFA CTO or lifestyle limiting  claudication.      Melinda Gess MD FACP,FACC,FAHA, [name] 10/18/2012 6:04 PM

## 2012-10-18 NOTE — Assessment & Plan Note (Signed)
Patient is status post left SFA PTA and stenting by myself on 02/18/11. She did have a 60% segmental mid right SFA stenosis with three-vessel runoff bilaterally. She complains of lifestyle limiting claudication. Recent lower Dopplers reveal a decrease in her right ABI 0.7 with an occluded mid right SFA. She has a high-frequency signal in her mid left SFA suggesting "in-stent restenosis. She presents now for angiography and potential percutaneous intervention of her right SFA CTO or lifestyle limiting claudication.

## 2012-10-19 ENCOUNTER — Encounter: Payer: Self-pay | Admitting: Cardiovascular Disease

## 2012-10-19 ENCOUNTER — Encounter (HOSPITAL_COMMUNITY): Payer: Self-pay | Admitting: Cardiovascular Disease

## 2012-10-19 ENCOUNTER — Other Ambulatory Visit: Payer: Self-pay | Admitting: Cardiovascular Disease

## 2012-10-19 ENCOUNTER — Ambulatory Visit (INDEPENDENT_AMBULATORY_CARE_PROVIDER_SITE_OTHER): Payer: Medicare Other | Admitting: Cardiovascular Disease

## 2012-10-19 VITALS — BP 122/82 | HR 76 | Resp 20 | Ht 61.0 in | Wt 162.0 lb

## 2012-10-19 DIAGNOSIS — Z720 Tobacco use: Secondary | ICD-10-CM

## 2012-10-19 DIAGNOSIS — Z950 Presence of cardiac pacemaker: Secondary | ICD-10-CM

## 2012-10-19 DIAGNOSIS — Z87891 Personal history of nicotine dependence: Secondary | ICD-10-CM

## 2012-10-19 DIAGNOSIS — F17200 Nicotine dependence, unspecified, uncomplicated: Secondary | ICD-10-CM

## 2012-10-19 LAB — PACEMAKER DEVICE OBSERVATION

## 2012-10-19 NOTE — Assessment & Plan Note (Signed)
She is smoking again. Smoking cessation was discussed in detail. She is at true risk of limb loss or other serious complications.

## 2012-10-19 NOTE — Assessment & Plan Note (Signed)
She has a dual-chamber Medtronic pacemaker does implanted roughly 8 years ago for sinus node dysfunction. The device is functioning normally. There is 100% atrial pacing but only 10% ventricular pacing. The lead parameters are good. Battery longevity is approximately 18 months. No episodes of either atrial or ventricular tachyarrhythmia have been detected. She'll continue with [employer] monitor download every 3 months and an office visit in one year  Atrial lead impedance 348 ohms sensed P waves 2 mV threshold 1.25 V at 0.4 ms. Right ventricular lead impedance 874 ohms, sensed R waves 8 mV, threshold 0.5 V at 0.4 ms

## 2012-10-19 NOTE — Patient Instructions (Signed)
Continue pacemaker downloads every 3 months. In office pacemaker check in one year

## 2012-10-19 NOTE — Progress Notes (Signed)
Patient ID: [name], female   DOB: [birth year], 67 y.o.   MRN: [id number]     Reason for office visit Pacemaker check, sinus node dysfunction  [name] just saw Dr. Allyson Sabal yesterday for peripheral arterial disease. She has had recurrence of bilateral superficial femoral artery blockages following stents. She will require another coronary angiogram and revascularization procedure which is scheduled for next week.  She's here today for a pacemaker check. She has not had problems with syncope dizziness lightheadedness or fatigue. Unfortunately she has started smoking again. A lot of time discussing the importance of permanent smoking cessation and she convincingly reports that she will do everything she can to achieve this.    Allergies  Allergen Reactions  . Prednisone Other (See Comments)    Chest pain    Current Outpatient Prescriptions  Medication Sig Dispense Refill  . albuterol (PROVENTIL) (2.5 MG/3ML) 0.083% nebulizer solution Take 2.5 mg by nebulization every 6 (six) hours as needed. Shortness of breath, wheezing      . aspirin EC 81 MG tablet Take 81 mg by mouth daily.       Marland Kitchen docusate sodium (COLACE) 100 MG capsule Take 100 mg by mouth 3 (three) times daily as needed for constipation.      Marland Kitchen oxyCODONE-acetaminophen (PERCOCET) 10-325 MG per tablet Take 1 tablet by mouth every 6 (six) hours as needed. *May take one tablet by mouth up to 4 times daily as needed for severe pain*      . pantoprazole (PROTONIX) 40 MG tablet Take 40 mg by mouth daily.      . simvastatin (ZOCOR) 40 MG tablet Take 40 mg by mouth every evening.         No current facility-administered medications for this visit.    Past Medical History  Diagnosis Date  . Sleep apnea     uses cpap  . Peripheral vascular disease   . GERD (gastroesophageal reflux disease)   . Neuromuscular disorder     HX of MD  . Diabetes   . Sinus node dysfunction   . H/O cardiac pacemaker   . Tobacco abuse   .  Hyperlipidemia   . SSS (sick sinus syndrome)     Past Surgical History  Procedure Laterality Date  . Insert / replace / remove pacemaker    . Cholecystectomy    . Abdominal hysterectomy    . Tonsillectomy    . Tubal ligation    . Femoral artery stent  02/18/2011  . Esophagogastroduodenoscopy  04/13/2002    A short segment of salmon-colored epithelium in the distal esophagus  consistent with Barrett's esophagus, biopsied/ The remainder of the esophageal mucosa, stomach, and duodenum through the second portion appeared normal  . Colonoscopy  04/13/2002    Diminutive polyps in the rectum cold biopsied/removed/ The remainder of the rectal mucosa and colon appeared normal.    No family history on file.  History   Social History  . Marital Status: Married    Spouse Name: N/A    Number of Children: N/A  . Years of Education: N/A   Occupational History  . Not on file.   Social History Main Topics  . Smoking status: Current Every Day Smoker -- 0.50 packs/day for 50 years    Types: Cigarettes  . Smokeless tobacco: Never Used  . Alcohol Use: No  . Drug Use: No  . Sexually Active: Not on file   Other Topics Concern  . Not on file   Social  History Narrative  . No narrative on file    Review of systems: Has bilateral intermittent claudication, right crit and left The patient specifically denies any chest pain at rest or with exertion, dyspnea at rest or with exertion, orthopnea, paroxysmal nocturnal dyspnea, syncope, palpitations, focal neurological deficits,lower extremity edema, unexplained weight gain, cough, hemoptysis or wheezing.  The patient also denies abdominal pain, nausea, vomiting, dysphagia, diarrhea, constipation, polyuria, polydipsia, dysuria, hematuria, frequency, urgency, abnormal bleeding or bruising, fever, chills, unexpected weight changes, mood swings, change in skin or hair texture, change in voice quality, auditory or visual problems, allergic reactions or  rashes, new musculoskeletal complaints other than usual "aches and pains".   PHYSICAL EXAM BP 122/82  Pulse 76  Resp 20  Ht 5\' 1"  (1.549 m)  Wt 162 lb (73.483 kg)  BMI 30.63 kg/m2  General: Alert, oriented x3, no distress Head: no evidence of trauma, PERRL, EOMI, no exophtalmos or lid lag, no myxedema, no xanthelasma; normal ears, nose and oropharynx Neck: normal jugular venous pulsations and no hepatojugular reflux; brisk carotid pulses without delay and no carotid bruits Chest: clear to auscultation, no signs of consolidation by percussion or palpation, normal fremitus, symmetrical and full respiratory excursions, normal left subclavian pacemaker site Cardiovascular: normal position and quality of the apical impulse, regular rhythm, normal first and second heart sounds, no murmurs, rubs or gallops Abdomen: no tenderness or distention, no masses by palpation, no abnormal pulsatility or arterial bruits, normal bowel sounds, no hepatosplenomegaly Extremities: no clubbing, cyanosis or edema;  Neurological: grossly nonfocal  Lipid Panel  No results found for this basename: chol, trig, hdl, cholhdl, vldl, ldlcalc    BMET    Component Value Date/Time   NA 141 05/27/2012 1732   K 4.5 05/27/2012 1732   CL 105 05/27/2012 1732   CO2 27 05/27/2012 1732   GLUCOSE 130* 05/27/2012 1732   BUN 17 05/27/2012 1732   CREATININE 0.94 05/27/2012 1732   CALCIUM 9.7 05/27/2012 1732   GFRNONAA 61* 05/27/2012 1732   GFRAA 71* 05/27/2012 1732     ASSESSMENT AND PLAN Cardiac pacemaker in situ She has a dual-chamber Medtronic pacemaker does implanted roughly 8 years ago for sinus node dysfunction. The device is functioning normally. There is 100% atrial pacing but only 10% ventricular pacing. The lead parameters are good. Battery longevity is approximately 18 months. No episodes of either atrial or ventricular tachyarrhythmia have been detected. She'll continue with CareLink monitor download every 3 months and  an office visit in one year  Atrial lead impedance 348 ohms sensed P waves 2 mV threshold 1.25 V at 0.4 ms. Right ventricular lead impedance 874 ohms, sensed R waves 8 mV, threshold 0.5 V at 0.4 ms  Smoking hx She is smoking again. Smoking cessation was discussed in detail. She is at true risk of limb loss or other serious complications.  Junious Silk, MD, [facility] [phone] office [phone] pager

## 2012-10-22 ENCOUNTER — Encounter (HOSPITAL_COMMUNITY): Payer: Self-pay | Admitting: Pharmacy Technician

## 2012-10-23 LAB — CBC
HCT: 38.9 % (ref 36.0–46.0)
RBC: 4.71 MIL/uL (ref 3.87–5.11)
RDW: 16.6 % — ABNORMAL HIGH (ref 11.5–15.5)
WBC: 6.8 10*3/uL (ref 4.0–10.5)

## 2012-10-23 LAB — PROTIME-INR
INR: 0.95 (ref <1.50)
Prothrombin Time: 12.7 seconds (ref 11.6–15.2)

## 2012-10-24 ENCOUNTER — Ambulatory Visit (HOSPITAL_COMMUNITY)
Admission: RE | Admit: 2012-10-24 | Discharge: 2012-10-24 | Disposition: A | Discharge status: Home / Self Care | Payer: Medicare Other | Source: Ambulatory Visit | Attending: Cardiovascular Disease | Admitting: Cardiovascular Disease

## 2012-10-24 DIAGNOSIS — G4730 Sleep apnea, unspecified: Secondary | ICD-10-CM | POA: Insufficient documentation

## 2012-10-24 DIAGNOSIS — I4891 Unspecified atrial fibrillation: Secondary | ICD-10-CM | POA: Insufficient documentation

## 2012-10-24 DIAGNOSIS — E785 Hyperlipidemia, unspecified: Secondary | ICD-10-CM | POA: Insufficient documentation

## 2012-10-24 DIAGNOSIS — R011 Cardiac murmur, unspecified: Secondary | ICD-10-CM

## 2012-10-24 LAB — TSH: TSH: 1.727 u[IU]/mL (ref 0.350–4.500)

## 2012-10-24 LAB — BASIC METABOLIC PANEL
Calcium: 9.8 mg/dL (ref 8.4–10.5)
Glucose, Bld: 111 mg/dL — ABNORMAL HIGH (ref 70–99)
Potassium: 4.5 mEq/L (ref 3.5–5.3)
Sodium: 143 mEq/L (ref 135–145)

## 2012-10-24 NOTE — Progress Notes (Signed)
2D Echo Performed 10/24/2012    Ceasar Decandia, RCS  

## 2012-10-25 LAB — PACEMAKER DEVICE OBSERVATION
AL AMPLITUDE: 2 mv
AL IMPEDENCE PM: 348 Ohm
BAMS-0001: 150 {beats}/min
BATTERY VOLTAGE: 2.72 V
RV LEAD IMPEDENCE PM: 874 Ohm
VENTRICULAR PACING PM: 10

## 2012-10-27 ENCOUNTER — Encounter: Payer: Self-pay | Admitting: Cardiovascular Disease

## 2012-10-29 ENCOUNTER — Encounter (HOSPITAL_COMMUNITY)
Admission: RE | Disposition: A | Discharge status: Home / Self Care | Payer: Self-pay | Source: Ambulatory Visit | Attending: Cardiovascular Disease

## 2012-10-29 ENCOUNTER — Ambulatory Visit (HOSPITAL_COMMUNITY)
Admission: RE | Admit: 2012-10-29 | Discharge: 2012-10-29 | Disposition: A | Discharge status: Home / Self Care | Payer: Medicare Other | Source: Ambulatory Visit | Attending: Cardiovascular Disease | Admitting: Cardiovascular Disease

## 2012-10-29 DIAGNOSIS — Z7982 Long term (current) use of aspirin: Secondary | ICD-10-CM | POA: Insufficient documentation

## 2012-10-29 DIAGNOSIS — I70219 Atherosclerosis of native arteries of extremities with intermittent claudication, unspecified extremity: Secondary | ICD-10-CM

## 2012-10-29 DIAGNOSIS — E785 Hyperlipidemia, unspecified: Secondary | ICD-10-CM | POA: Insufficient documentation

## 2012-10-29 DIAGNOSIS — Z87891 Personal history of nicotine dependence: Secondary | ICD-10-CM | POA: Insufficient documentation

## 2012-10-29 DIAGNOSIS — Y929 Unspecified place or not applicable: Secondary | ICD-10-CM | POA: Insufficient documentation

## 2012-10-29 DIAGNOSIS — Z79899 Other long term (current) drug therapy: Secondary | ICD-10-CM | POA: Insufficient documentation

## 2012-10-29 DIAGNOSIS — Z01818 Encounter for other preprocedural examination: Secondary | ICD-10-CM

## 2012-10-29 DIAGNOSIS — I7092 Chronic total occlusion of artery of the extremities: Secondary | ICD-10-CM | POA: Insufficient documentation

## 2012-10-29 DIAGNOSIS — Z950 Presence of cardiac pacemaker: Secondary | ICD-10-CM | POA: Insufficient documentation

## 2012-10-29 DIAGNOSIS — T82898A Other specified complication of vascular prosthetic devices, implants and grafts, initial encounter: Secondary | ICD-10-CM | POA: Insufficient documentation

## 2012-10-29 DIAGNOSIS — Z888 Allergy status to other drugs, medicaments and biological substances status: Secondary | ICD-10-CM | POA: Insufficient documentation

## 2012-10-29 DIAGNOSIS — I4891 Unspecified atrial fibrillation: Secondary | ICD-10-CM | POA: Insufficient documentation

## 2012-10-29 DIAGNOSIS — Y831 Surgical operation with implant of artificial internal device as the cause of abnormal reaction of the patient, or of later complication, without mention of misadventure at the time of the procedure: Secondary | ICD-10-CM | POA: Insufficient documentation

## 2012-10-29 HISTORY — PX: ABDOMINAL ANGIOGRAM: SHX5499

## 2012-10-29 HISTORY — PX: LOWER EXTREMITY ANGIOGRAM: SHX5508

## 2012-10-29 LAB — GLUCOSE, CAPILLARY

## 2012-10-29 SURGERY — ANGIOGRAM, LOWER EXTREMITY
Anesthesia: LOCAL

## 2012-10-29 MED ORDER — FENTANYL CITRATE 0.05 MG/ML IJ SOLN
INTRAMUSCULAR | Status: AC
  Filled 2012-10-29: qty 2

## 2012-10-29 MED ORDER — SODIUM CHLORIDE 0.9 % IJ SOLN: 3.0000 mL | INTRAMUSCULAR | Status: DC | PRN

## 2012-10-29 MED ORDER — SODIUM CHLORIDE 0.9 % IV SOLN: INTRAVENOUS | Status: AC

## 2012-10-29 MED ORDER — ACETAMINOPHEN 325 MG PO TABS: 650.0000 mg | ORAL_TABLET | ORAL | Status: DC | PRN

## 2012-10-29 MED ORDER — HEPARIN (PORCINE) IN NACL 2-0.9 UNIT/ML-% IJ SOLN
INTRAMUSCULAR | Status: AC
  Filled 2012-10-29: qty 1000

## 2012-10-29 MED ORDER — SODIUM CHLORIDE 0.9 % IV SOLN
INTRAVENOUS | Status: DC
  Administered 2012-10-29: 1000 mL via INTRAVENOUS

## 2012-10-29 MED ORDER — ONDANSETRON HCL 4 MG/2ML IJ SOLN: 4.0000 mg | Freq: Four times a day (QID) | INTRAMUSCULAR | Status: DC | PRN

## 2012-10-29 MED ORDER — MORPHINE SULFATE 2 MG/ML IJ SOLN: 1.0000 mg | INTRAMUSCULAR | Status: DC | PRN

## 2012-10-29 MED ORDER — ASPIRIN 81 MG PO CHEW
324.0000 mg | CHEWABLE_TABLET | ORAL | Status: AC
  Administered 2012-10-29: 324 mg via ORAL
  Filled 2012-10-29: qty 4

## 2012-10-29 MED ORDER — LIDOCAINE HCL (PF) 1 % IJ SOLN
INTRAMUSCULAR | Status: AC
  Filled 2012-10-29: qty 30

## 2012-10-29 MED ORDER — DIAZEPAM 5 MG PO TABS
5.0000 mg | ORAL_TABLET | ORAL | Status: AC
  Administered 2012-10-29: 5 mg via ORAL
  Filled 2012-10-29: qty 1

## 2012-10-29 NOTE — CV Procedure (Signed)
[name] is a 68 y.o. female    [id number] LOCATION:  FACILITY: [facility]  PHYSICIAN: Nanetta Batty, M.D. 09-15-1945   DATE OF PROCEDURE:  10/29/2012  DATE OF DISCHARGE:   CARDIAC CATHETERIZATION     History obtained from chart review.[name] is a 67 year old Caucasian female patient of Dr. Erin Hearing with a history of paroxysmal A. Fib status post remote pacemaker implantation. He follows her for this. She has peripheral vascular disease status post left SFA PTA and stenting for claudication with residual right SFA disease. This has progressed. She now has lifestyle limiting claudication. She also has hyperlipidemia on statin therapy. Recent Dopplers performed 10/03/12 suggest occlusion of her right SFA with in-stent restenosis on the left side. Patient will require angiography and re\re intervention. I have stressed the importance of smoking cessation.    PROCEDURE DESCRIPTION:    The patient was brought to the second floor [place] [facility] cath lab in the postabsorptive state. She was premedicated with Valium 5 mg by mouth, IV fentanyl  Her left groinwas prepped and shaved in usual sterile fashion. Xylocaine 1% was used for local anesthesia. A 5 French sheath was inserted into the left common femoral artery using standard Seldinger technique. A 5 French pigtail catheter was used for abdominal aortography and bifemoral runoff using bolus chase a digital subtraction settable technique. Visipaque dye was used for the entirety of the case. Retrograde aortic pressure was monitored during the case. A total of 115 cc of contrast was administered to the patient.   HEMODYNAMICS:    AO SYSTOLIC/AO DIASTOLIC: 148/76    ANGIOGRAPHIC RESULTS:   1: Bilateral iliac angiography-right and left common and external iliac arteries were widely patent.  2: Left lower extremity-the left SFA stent was patent with 60-70% in-stent restenosis and put in the proximal portion as well as in in the  midportion of the stent. There was three-vessel runoff  3: The right SFA was occluded over a long segment in the midportion with a diffusely diseased proximal third leading up to the occlusion with a large collateral. Reconstituted by profunda femoris collaterals in the adductor canal. There were 60% segmental tibial peroneal trunk stenosis as well as a 90% segmental proximal posterior tibial stenosis.  IMPRESSION: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 I believe this would be a difficult case to perform percutaneously given the amount of stent and most likely the inability to access the true lumen given the collateral takeoff. The sheath was removed and pressure was held on the groin to achieve hemostasis. The patient left the lab in stable condition. She will be hydrated and discharged him as an outpatient. I will see her back in the office.  Runell Gess MD, [facility] 10/29/2012 9:57 AM

## 2012-10-29 NOTE — H&P (Signed)
    Pt was reexamined and existing H & P reviewed. No changes found.  Runell Gess, MD [facility] 10/29/2012 9:14 AM

## 2012-10-31 ENCOUNTER — Encounter: Payer: Self-pay | Admitting: *Deleted

## 2012-11-08 ENCOUNTER — Ambulatory Visit: Payer: Medicare Other | Admitting: Cardiovascular Disease

## 2013-03-19 ENCOUNTER — Encounter: Payer: Self-pay | Admitting: *Deleted

## 2013-04-08 ENCOUNTER — Encounter (HOSPITAL_COMMUNITY): Payer: Self-pay | Admitting: Emergency Medicine

## 2013-04-08 ENCOUNTER — Emergency Department (HOSPITAL_COMMUNITY): Payer: Medicare Other

## 2013-04-08 ENCOUNTER — Other Ambulatory Visit: Payer: Self-pay

## 2013-04-08 ENCOUNTER — Inpatient Hospital Stay (HOSPITAL_COMMUNITY)
Admission: EM | Admit: 2013-04-08 | Discharge: 2013-04-15 | DRG: 193 | Disposition: A | Discharge status: Home Health | Payer: Medicare Other | Attending: Family Medicine | Admitting: Family Medicine

## 2013-04-08 DIAGNOSIS — I509 Heart failure, unspecified: Secondary | ICD-10-CM | POA: Diagnosis present

## 2013-04-08 DIAGNOSIS — J449 Chronic obstructive pulmonary disease, unspecified: Secondary | ICD-10-CM | POA: Diagnosis present

## 2013-04-08 DIAGNOSIS — G7109 Other specified muscular dystrophies: Secondary | ICD-10-CM | POA: Diagnosis present

## 2013-04-08 DIAGNOSIS — J441 Chronic obstructive pulmonary disease with (acute) exacerbation: Secondary | ICD-10-CM | POA: Diagnosis present

## 2013-04-08 DIAGNOSIS — J9600 Acute respiratory failure, unspecified whether with hypoxia or hypercapnia: Secondary | ICD-10-CM

## 2013-04-08 DIAGNOSIS — I495 Sick sinus syndrome: Secondary | ICD-10-CM | POA: Diagnosis present

## 2013-04-08 DIAGNOSIS — K219 Gastro-esophageal reflux disease without esophagitis: Secondary | ICD-10-CM | POA: Diagnosis present

## 2013-04-08 DIAGNOSIS — E1165 Type 2 diabetes mellitus with hyperglycemia: Secondary | ICD-10-CM

## 2013-04-08 DIAGNOSIS — Z87891 Personal history of nicotine dependence: Secondary | ICD-10-CM

## 2013-04-08 DIAGNOSIS — I440 Atrioventricular block, first degree: Secondary | ICD-10-CM | POA: Diagnosis present

## 2013-04-08 DIAGNOSIS — I739 Peripheral vascular disease, unspecified: Secondary | ICD-10-CM | POA: Diagnosis present

## 2013-04-08 DIAGNOSIS — R079 Chest pain, unspecified: Secondary | ICD-10-CM

## 2013-04-08 DIAGNOSIS — J9602 Acute respiratory failure with hypercapnia: Secondary | ICD-10-CM

## 2013-04-08 DIAGNOSIS — F17200 Nicotine dependence, unspecified, uncomplicated: Secondary | ICD-10-CM | POA: Diagnosis present

## 2013-04-08 DIAGNOSIS — Z7982 Long term (current) use of aspirin: Secondary | ICD-10-CM

## 2013-04-08 DIAGNOSIS — IMO0001 Reserved for inherently not codable concepts without codable children: Secondary | ICD-10-CM | POA: Diagnosis present

## 2013-04-08 DIAGNOSIS — E782 Mixed hyperlipidemia: Secondary | ICD-10-CM

## 2013-04-08 DIAGNOSIS — Z950 Presence of cardiac pacemaker: Secondary | ICD-10-CM

## 2013-04-08 DIAGNOSIS — I2789 Other specified pulmonary heart diseases: Secondary | ICD-10-CM | POA: Diagnosis present

## 2013-04-08 DIAGNOSIS — E785 Hyperlipidemia, unspecified: Secondary | ICD-10-CM | POA: Diagnosis present

## 2013-04-08 DIAGNOSIS — G7111 Myotonic muscular dystrophy: Secondary | ICD-10-CM

## 2013-04-08 DIAGNOSIS — R0902 Hypoxemia: Secondary | ICD-10-CM

## 2013-04-08 DIAGNOSIS — G47419 Narcolepsy without cataplexy: Secondary | ICD-10-CM | POA: Diagnosis present

## 2013-04-08 DIAGNOSIS — K5900 Constipation, unspecified: Secondary | ICD-10-CM | POA: Diagnosis present

## 2013-04-08 DIAGNOSIS — E119 Type 2 diabetes mellitus without complications: Secondary | ICD-10-CM

## 2013-04-08 DIAGNOSIS — G4733 Obstructive sleep apnea (adult) (pediatric): Secondary | ICD-10-CM | POA: Diagnosis present

## 2013-04-08 DIAGNOSIS — J9601 Acute respiratory failure with hypoxia: Secondary | ICD-10-CM | POA: Diagnosis present

## 2013-04-08 DIAGNOSIS — J189 Pneumonia, unspecified organism: Principal | ICD-10-CM

## 2013-04-08 LAB — CBC WITH DIFFERENTIAL/PLATELET
Basophils Absolute: 0 10*3/uL (ref 0.0–0.1)
Basophils Relative: 0 % (ref 0–1)
EOS ABS: 0.3 10*3/uL (ref 0.0–0.7)
Eosinophils Relative: 3 % (ref 0–5)
HCT: 35.9 % — ABNORMAL LOW (ref 36.0–46.0)
HEMOGLOBIN: 10.7 g/dL — AB (ref 12.0–15.0)
LYMPHS ABS: 1.5 10*3/uL (ref 0.7–4.0)
LYMPHS PCT: 16 % (ref 12–46)
MCH: 25.3 pg — AB (ref 26.0–34.0)
MCHC: 29.8 g/dL — ABNORMAL LOW (ref 30.0–36.0)
MCV: 84.9 fL (ref 78.0–100.0)
MONOS PCT: 11 % (ref 3–12)
Monocytes Absolute: 1 10*3/uL (ref 0.1–1.0)
NEUTROS ABS: 6.6 10*3/uL (ref 1.7–7.7)
NEUTROS PCT: 70 % (ref 43–77)
PLATELETS: 172 10*3/uL (ref 150–400)
RBC: 4.23 MIL/uL (ref 3.87–5.11)
RDW: 17.1 % — ABNORMAL HIGH (ref 11.5–15.5)
WBC: 9.4 10*3/uL (ref 4.0–10.5)

## 2013-04-08 LAB — COMPREHENSIVE METABOLIC PANEL
ALT: 6 U/L (ref 0–35)
AST: 11 U/L (ref 0–37)
Albumin: 3.1 g/dL — ABNORMAL LOW (ref 3.5–5.2)
Alkaline Phosphatase: 76 U/L (ref 39–117)
BUN: 12 mg/dL (ref 6–23)
CALCIUM: 9.3 mg/dL (ref 8.4–10.5)
CO2: 29 meq/L (ref 19–32)
CREATININE: 1.07 mg/dL (ref 0.50–1.10)
Chloride: 103 mEq/L (ref 96–112)
GFR, EST AFRICAN AMERICAN: 61 mL/min — AB (ref >90)
GFR, EST NON AFRICAN AMERICAN: 52 mL/min — AB (ref >90)
GLUCOSE: 132 mg/dL — AB (ref 70–99)
Potassium: 4.4 mEq/L (ref 3.7–5.3)
SODIUM: 141 meq/L (ref 137–147)
TOTAL PROTEIN: 6.6 g/dL (ref 6.0–8.3)
Total Bilirubin: 0.3 mg/dL (ref 0.3–1.2)

## 2013-04-08 LAB — PRO B NATRIURETIC PEPTIDE: PRO B NATRI PEPTIDE: 1980 pg/mL — AB (ref 0–125)

## 2013-04-08 LAB — LACTIC ACID, PLASMA: LACTIC ACID, VENOUS: 0.8 mmol/L (ref 0.5–2.2)

## 2013-04-08 LAB — TROPONIN I

## 2013-04-08 MED ORDER — MORPHINE SULFATE 2 MG/ML IJ SOLN
2.0000 mg | Freq: Once | INTRAMUSCULAR | Status: AC
  Administered 2013-04-08: 2 mg via INTRAVENOUS
  Filled 2013-04-08: qty 1

## 2013-04-08 MED ORDER — ONDANSETRON HCL 4 MG/2ML IJ SOLN
4.0000 mg | Freq: Once | INTRAMUSCULAR | Status: AC
  Administered 2013-04-08: 4 mg via INTRAVENOUS
  Filled 2013-04-08: qty 2

## 2013-04-08 MED ORDER — SODIUM CHLORIDE 0.9 % IV SOLN
INTRAVENOUS | Status: DC
  Administered 2013-04-08: 19:00:00 via INTRAVENOUS

## 2013-04-08 MED ORDER — DEXTROSE 5 % IV SOLN
1.0000 g | Freq: Once | INTRAVENOUS | Status: AC
  Administered 2013-04-08: 1 g via INTRAVENOUS
  Filled 2013-04-08: qty 10

## 2013-04-08 MED ORDER — MORPHINE SULFATE 2 MG/ML IJ SOLN
2.0000 mg | Freq: Once | INTRAMUSCULAR | Status: DC
Start: 2013-04-08 — End: 2013-04-15

## 2013-04-08 MED ORDER — AZITHROMYCIN 500 MG IV SOLR
500.0000 mg | Freq: Once | INTRAVENOUS | Status: DC
  Administered 2013-04-09: 500 mg via INTRAVENOUS

## 2013-04-08 NOTE — ED Notes (Signed)
Carmita Boom [facility]) [phone] Call if anything happens with care.

## 2013-04-08 NOTE — ED Provider Notes (Addendum)
CSN: [id number]     Arrival date & time 04/08/13  1654 History   First MD Initiated Contact with Patient 04/08/13 1717 This chart was scribed for Melinda Kung, MD by [name], ED Scribe. This patient was seen in room APA08/APA08 and the patient's care was started at 5:33 PM.      Chief Complaint  Patient presents with  . Chest Pain    Patient is a 68 y.o. female presenting with chest pain. The history is provided by the patient and the EMS personnel. No language interpreter was used.  Chest Pain Pain location:  Substernal area and L chest Pain radiates to:  L shoulder and mid back Pain radiates to the back: yes   Pain severity:  Mild Onset quality:  Sudden Duration: 8:00 AM this morning. Timing:  Intermittent Progression:  Improving Chronicity:  New Worsened by:  Deep breathing Associated symptoms: back pain, cough (baseline, unproductive), headache (mild) and shortness of breath   Associated symptoms: no abdominal pain, no fever, no nausea and not vomiting    HPI Comments: [name] is a 68 y.o. female who presents to the Emergency Department complaining of intermittent, mild chest pain, with a severity of 7/10, onset earlier this morning around 8:00 AM. She reports her chest pain became constant around 2:00-3:00 PM. She reports as the today progressed the pain has radiated to her left chest, beneath left breast, and to her left shoulder blade. EMS gave pt Aspiring and nitro upon arrival. Pt reports now her pain severity is 4/10. She reports her chest pain is exacerbated by deep breathing. She reports h/o peripheral vascular disease, blocked arteries, and stent placement. She denies h/o MI. She denies being on O2 at home. She denies nausea, vomiting, worsening SOB, and any other associated symptoms. She also reports h/o COPD, narcolepsy, sleep apnea, and neuromuscular disorder.   Cardiology - [facility] - Dr. Loletha [name] PCP - Melinda Bellow, MD  Past Medical History   Diagnosis Date  . Sleep apnea     uses cpap  . Peripheral vascular disease   . GERD (gastroesophageal reflux disease)   . Neuromuscular disorder     HX of MD  . Diabetes   . Sinus node dysfunction   . H/O cardiac pacemaker   . Tobacco abuse   . Hyperlipidemia   . SSS (sick sinus syndrome)    Past Surgical History  Procedure Laterality Date  . Insert / replace / remove pacemaker    . Cholecystectomy    . Abdominal hysterectomy    . Tonsillectomy    . Tubal ligation    . Femoral artery stent  02/18/2011  . Esophagogastroduodenoscopy  04/13/2002    A short segment of salmon-colored epithelium in the distal esophagus  consistent with Barrett's esophagus, biopsied/ The remainder of the esophageal mucosa, stomach, and duodenum through the second portion appeared normal  . Colonoscopy  04/13/2002    Diminutive polyps in the rectum cold biopsied/removed/ The remainder of the rectal mucosa and colon appeared normal.   History reviewed. No pertinent family history. History  Substance Use Topics  . Smoking status: Current [name] -- 0.50 packs/day for 50 years    Types: Cigarettes  . Smokeless tobacco: Never Used  . Alcohol Use: No   OB History   Grav Para Term Preterm Abortions TAB SAB Ect Mult Living                 Review of Systems  Constitutional: Negative  for fever and chills.  HENT: Negative for rhinorrhea and sore throat.   Eyes: Negative for visual disturbance.  Respiratory: Positive for cough (baseline, unproductive) and shortness of breath.   Cardiovascular: Positive for chest pain and leg swelling (bilateral).  Gastrointestinal: Negative for nausea, vomiting, abdominal pain and diarrhea.  Genitourinary: Negative for dysuria.  Musculoskeletal: Positive for back pain.  Skin: Negative for rash.  Neurological: Positive for headaches (mild).  Hematological: Does not bruise/bleed easily.  Psychiatric/Behavioral: Negative for confusion.    Allergies   Prednisone  Home Medications   Current Outpatient Rx  Name  Route  Sig  Dispense  Refill  . aspirin EC 81 MG tablet   Oral   Take 81 mg by mouth 2 (two) times daily.          Marland Kitchen docusate sodium (COLACE) 100 MG capsule   Oral   Take 100 mg by mouth daily as needed for constipation.          Marland Kitchen oxyCODONE-acetaminophen (PERCOCET) 10-325 MG per tablet   Oral   Take 1 tablet by mouth [name] 6 (six) hours as needed for pain.          . pantoprazole (PROTONIX) 40 MG tablet   Oral   Take 40 mg by mouth [name] morning.          . simvastatin (ZOCOR) 40 MG tablet   Oral   Take 40 mg by mouth [name] evening.           Marland Kitchen albuterol (PROVENTIL) (2.5 MG/3ML) 0.083% nebulizer solution   Nebulization   Take 2.5 mg by nebulization [name] 6 (six) hours as needed for shortness of breath.           BP 132/66  Pulse 95  Temp(Src) 101.4 F (38.6 C) (Oral)  Ht 5\' 2"  (1.575 m)  Wt 170 lb (77.111 kg)  BMI 31.09 kg/m2  SpO2 95%  Physical Exam  Nursing note and vitals reviewed. Constitutional: She is oriented to person, place, and time. She appears well-developed and well-nourished. No distress.  HENT:  Head: Normocephalic and atraumatic.  Mouth/Throat: Oropharynx is clear and moist and mucous membranes are normal.  Eyes: EOM are normal.  Neck: Neck supple. No tracheal deviation present.  Cardiovascular: Normal rate, regular rhythm and normal heart sounds.  Exam reveals no gallop and no friction rub.   No murmur heard. Cap refill 2 seconds.  Pulmonary/Chest: Effort normal. No respiratory distress. She has no wheezes. She has no rales.  Decreased breath sounds bilaterally. O2 Sat 93% on 2 L.  Abdominal: Soft. Bowel sounds are normal. There is no tenderness.  Musculoskeletal: Normal range of motion. She exhibits edema (mild pitting edema bilaterally).  Neurological: She is alert and oriented to person, place, and time. No cranial nerve deficit. She exhibits normal muscle tone.  Coordination normal.  Skin: Skin is warm and dry.  Psychiatric: She has a normal mood and affect. Her behavior is normal.    ED Course  Procedures (including critical care time)  DIAGNOSTIC STUDIES: Oxygen Saturation is 95% on room air, adequate by my interpretation.    COORDINATION OF CARE: 5:40 PM-Discussed treatment plan which includes being admitted due to the chest pain with pt at bedside and pt agreed to plan.   Results for orders placed during the hospital encounter of 04/08/13  COMPREHENSIVE METABOLIC PANEL      Result Value Range   Sodium 141  137 - 147 mEq/L   Potassium 4.4  3.7 -  5.3 mEq/L   Chloride 103  96 - 112 mEq/L   CO2 29  19 - 32 mEq/L   Glucose, Bld 132 (*) 70 - 99 mg/dL   BUN 12  6 - 23 mg/dL   Creatinine, Ser 1.07  0.50 - 1.10 mg/dL   Calcium 9.3  8.4 - 10.5 mg/dL   Total Protein 6.6  6.0 - 8.3 g/dL   Albumin 3.1 (*) 3.5 - 5.2 g/dL   AST 11  0 - 37 U/L   ALT 6  0 - 35 U/L   Alkaline Phosphatase 76  39 - 117 U/L   Total Bilirubin 0.3  0.3 - 1.2 mg/dL   GFR calc non Af Amer 52 (*) >90 mL/min   GFR calc Af Amer 61 (*) >90 mL/min  TROPONIN I      Result Value Range   Troponin I <0.30  <0.30 ng/mL  CBC WITH DIFFERENTIAL      Result Value Range   WBC 9.4  4.0 - 10.5 K/uL   RBC 4.23  3.87 - 5.11 MIL/uL   Hemoglobin 10.7 (*) 12.0 - 15.0 g/dL   HCT 35.9 (*) 36.0 - 46.0 %   MCV 84.9  78.0 - 100.0 fL   MCH 25.3 (*) 26.0 - 34.0 pg   MCHC 29.8 (*) 30.0 - 36.0 g/dL   RDW 17.1 (*) 11.5 - 15.5 %   Platelets 172  150 - 400 K/uL   Neutrophils Relative % 70  43 - 77 %   Neutro Abs 6.6  1.7 - 7.7 K/uL   Lymphocytes Relative 16  12 - 46 %   Lymphs Abs 1.5  0.7 - 4.0 K/uL   Monocytes Relative 11  3 - 12 %   Monocytes Absolute 1.0  0.1 - 1.0 K/uL   Eosinophils Relative 3  0 - 5 %   Eosinophils Absolute 0.3  0.0 - 0.7 K/uL   Basophils Relative 0  0 - 1 %   Basophils Absolute 0.0  0.0 - 0.1 K/uL  PRO B NATRIURETIC PEPTIDE      Result Value Range   Pro B  Natriuretic peptide (BNP) 1980.0 (*) 0 - 125 pg/mL   Dg Chest 2 View  04/08/2013   CLINICAL DATA:  Left chest pain.  EXAM: CHEST  2 VIEW  COMPARISON:  01/24/2012  FINDINGS: Again noted are prominent central vascular structures and slightly prominent interstitial lung densities. Heart size is upper limits of normal. Stable appearance of the left dual lead cardiac pacemaker. There is concern for focal densities in the lingula or right middle lobe based on the lateral view.  IMPRESSION: Focal densities along the anterior chest are probably involving the lingula. Findings could be associated with infection.  Evidence for central vascular congestion and similar to the prior examination.   Electronically Signed   By: Markus Daft M.D.   On: 04/08/2013 19:25   Ct Chest Wo Contrast  04/08/2013   CLINICAL DATA:  Cough and congestion  EXAM: CT CHEST WITHOUT CONTRAST  TECHNIQUE: Multidetector CT imaging of the chest was performed following the standard protocol without IV contrast.  COMPARISON:  Chest radiograph April 08, 2013  FINDINGS: There is focal consolidation in the posterior and inferior lingula. There is mild consolidation in the right middle lobe adjacent to the right heart border. There is also patchy infiltrate in the posterior segment of the left lower lobe.  On axial slice 33, series 3, there is a nodular opacity in the  superior segment of the right lower lobe measuring 5 mm. There is a granuloma in the medial right base which is calcified.  There are pacemaker leads attached to the right atrium and right ventricle. Heart is mildly enlarged. The pericardium is not thickened.  There is atherosclerotic change in the aorta but no apparent aneurysm. There is no demonstrable adenopathy.  Visualized upper abdominal structures appear unremarkable except for atherosclerotic change and absence of the gallbladder. There are no blastic or lytic bone lesions. There is calcification in the left lobe of the thyroid.   IMPRESSION: Areas of infiltrate bilaterally, more on the left than on the right.  5 mm nodular opacity in the superior segment of right lower lobe. Followup of this nodular opacity should be based on Fleischner Society guidelines. If the patient is at high risk for bronchogenic carcinoma, follow-up chest CT at 6-12 months is recommended. If the patient is at low risk for bronchogenic carcinoma, follow-up chest CT at 12 months is recommended. This recommendation follows the consensus statement: Guidelines for Management of Small Pulmonary Nodules Detected on CT Scans: A Statement from the [place] as published in Radiology 2005;237:395-400.  Pacemaker leads as described. Mild cardiac enlargement. Areas of atherosclerotic change.  No adenopathy.  There is calcification in the left lobe of the thyroid. The etiology of this calcification is uncertain. Nonemergent thyroid ultrasound advised to further assess.   Electronically Signed   By: Lowella Grip M.D.   On: 04/08/2013 20:42      EKG Interpretation   None      Date: 04/08/2013  Rate: 79  Rhythm: Atrial paced rhythm with prolonged AV conduction  QRS Axis: left  Intervals: Paced  ST/T Wave abnormalities: nonspecific ST/T changes  Conduction Disutrbances:left bundle branch block  Narrative Interpretation:   Old EKG Reviewed: none available Patient has a pacemaker for many years. Followed by [facility] heart and vascular cardiology. Pacemaker was put in for sick sinus syndrome.   MDM   1. Chest pain   2. Hypoxia   3. CAP (community acquired pneumonia)     Patient will require admission for the chest pain is left-sided that started today she'll need to rule out for that. CT scan was obtained to clarify whether there was pneumonia or pulmonary edema. It seems to be some bilateral infiltrates patient has not been in the hospital recently this would be a community-acquired pneumonia patient started on Rocephin and Zithromax.  Patient's initial troponin EKG without any change however she does have a pacemaker her cardiologist are [place] [place] [facility] care doctors Dr. Karie Kirks. Patient will require admission if you take her off oxygen she sats slightly below 90%. Social need formal rule out for the chest pain he continued treatment for the pneumonia. Patient is comfortable on 2 L of oxygen. Patient does not have oxygen at home. Patient also recently lost her husband this week which is an additional stressor. In addition the patient did present with a fever. So most likely the main problem here is a developing pneumonia. Will be admitted to telemetry. Temporary admit orders completed.   I personally performed the services described in this documentation, which was scribed in my presence. The recorded information has been reviewed and is accurate.     Melinda Kung, MD 04/08/13 [place], MD 04/08/13 2231

## 2013-04-08 NOTE — ED Notes (Signed)
Per patient mid sternal chest pain that radiates into left chest and into left shoulder. Patient denies shortness of breath but has labored breathing. Patient also has a congested cough but states "I always have that." Denies having fevers or cough being productive. Per EMS patient has had 324mg  of aspirin , 3 SL nitro, and has an inch of nitro paste applied by EMS. Patient denies any improvement. Per patient only cardiac hx is pace-maker. Per patient husband died this week.

## 2013-04-08 NOTE — H&P (Signed)
[facility] History and Physical  [name] VPX:[id number] DOB: 01-09-1946 DOA: 04/08/2013   PCP: Robert Bellow, MD  Specialists: Dr. Sallyanne Kuster with [facility]  Chief Complaint: Chest pain since this morning  HPI: [name] is a 68 y.o. female with a past with history of COPD, obstructive sleep apnea, narcolepsy, who has a pacemaker that was placed about 10 years ago for sick sinus syndrome, who was in her usual state of health of this morning when she started having chest pain in the left side under her breast radiating to her shoulder and arm pit. It was a sharp pain which would increase with deep breathing. It would come and go. She was also short of breath. She had a cough and some chest congestion as well. She was noted to have a fever of 101F in the ED. She denies any sick contacts. She's up-to-date on her influenza and pneumonia vaccinations. She denies any nausea, vomiting. No urinary discomfort. No dizziness or lightheadedness. The pain was 10 out of 10 in intensity when she came in to the ED, and currently, is about 6-7/10 in intensity, but the pain is mostly located in her back now. She said she's had this back pain before and is usually relieved with BenGay. No precipitating, aggravating or relieving factors identified except as noted above.  Home Medications: Prior to Admission medications   Medication Sig Start Date End Date Taking? Authorizing Provider  aspirin EC 81 MG tablet Take 81 mg by mouth 2 (two) times daily.    Yes Historical Provider, MD  docusate sodium (COLACE) 100 MG capsule Take 100 mg by mouth daily as needed for constipation.    Yes Historical Provider, MD  oxyCODONE-acetaminophen (PERCOCET) 10-325 MG per tablet Take 1 tablet by mouth every 6 (six) hours as needed for pain.    Yes Historical Provider, MD  pantoprazole (PROTONIX) 40 MG tablet Take 40 mg by mouth every morning.    Yes Historical Provider, MD  simvastatin (ZOCOR)  40 MG tablet Take 40 mg by mouth every evening.     Yes Historical Provider, MD  albuterol (PROVENTIL) (2.5 MG/3ML) 0.083% nebulizer solution Take 2.5 mg by nebulization every 6 (six) hours as needed for shortness of breath.     Historical Provider, MD    Allergies:  Allergies  Allergen Reactions  . Prednisone Other (See Comments)    Chest pain    Past Medical History: Past Medical History  Diagnosis Date  . Sleep apnea     uses cpap  . Peripheral vascular disease   . GERD (gastroesophageal reflux disease)   . Neuromuscular disorder     HX of MD  . Diabetes   . Sinus node dysfunction   . H/O cardiac pacemaker   . Tobacco abuse   . Hyperlipidemia   . SSS (sick sinus syndrome)     Past Surgical History  Procedure Laterality Date  . Insert / replace / remove pacemaker    . Cholecystectomy    . Abdominal hysterectomy    . Tonsillectomy    . Tubal ligation    . Femoral artery stent  02/18/2011  . Esophagogastroduodenoscopy  04/13/2002    A short segment of salmon-colored epithelium in the distal esophagus  consistent with Barrett's esophagus, biopsied/ The remainder of the esophageal mucosa, stomach, and duodenum through the second portion appeared normal  . Colonoscopy  04/13/2002    Diminutive polyps in the rectum cold biopsied/removed/ The remainder of the rectal  mucosa and colon appeared normal.    Social History: Patient lives in [place]. Her husband apparently passed away recently. She smokes 2 packs of cigarettes on a daily basis. No alcohol use. No illicit drug use. Uses a walker or wheelchair to get around.  Family History: There is history of heart disease, and cancer in the family, but she was unable to specify further.  Review of Systems - History obtained from the patient General ROS: fatigue Psychological ROS: positive for - anxiety Ophthalmic ROS: negative ENT ROS: negative Allergy and Immunology ROS: negative Hematological and Lymphatic ROS:  negative Endocrine ROS: negative Respiratory ROS: as in hpi Cardiovascular ROS: as in hpi Gastrointestinal ROS: no abdominal pain, change in bowel habits, or black or bloody stools Genito-Urinary ROS: no dysuria, trouble voiding, or hematuria Musculoskeletal ROS: negative Neurological ROS: no TIA or stroke symptoms Dermatological ROS: negative  Physical Examination  Filed Vitals:   04/08/13 1836 04/08/13 1900 04/08/13 2116 04/08/13 2309  BP: 127/54 134/63 135/103 111/48  Pulse: 69 73 72 71  Temp:    99.4 F (37.4 C)  TempSrc:    Oral  Resp: _0 Height:      Weight:      SpO2:   96% 93%    General appearance: alert, cooperative, appears stated age and no distress Head: Normocephalic, without obvious abnormality, atraumatic Eyes: conjunctivae/corneas clear. PERRL, EOM's intact.  Throat: lips, mucosa, and tongue normal; teeth and gums normal Neck: no adenopathy, no carotid bruit, no JVD, supple, symmetrical, trachea midline and thyroid not enlarged, symmetric, no tenderness/mass/nodules Back: Tenderness to palpation over the area on left scapula where she had pain and it was reproduced Resp: Decreased air entry at the bases with crackles and wheezing. Few rhonchi as well bilaterally Cardio: regular rate and rhythm, S1, S2 normal, no murmur, click, rub or gallop GI: soft, non-tender; bowel sounds normal; no masses,  no organomegaly Extremities: Minimal edema. Bilateral lower extremities. Pulses: 2+ and symmetric Skin: Skin color, texture, turgor normal. No rashes or lesions Lymph nodes: Cervical, supraclavicular, and axillary nodes normal. Neurologic: No focal deficits  Laboratory Data: Results for orders placed during the hospital encounter of 04/08/13 (from the past 48 hour(s))  COMPREHENSIVE METABOLIC PANEL     Status: Abnormal   Collection Time    04/08/13  5:14 PM      Result Value Range   Sodium 141  137 - 147 mEq/L   Potassium 4.4  3.7 - 5.3 mEq/L    Chloride 103  96 - 112 mEq/L   CO2 29  19 - 32 mEq/L   Glucose, Bld 132 (*) 70 - 99 mg/dL   BUN 12  6 - 23 mg/dL   Creatinine, Ser 1.07  0.50 - 1.10 mg/dL   Calcium 9.3  8.4 - 10.5 mg/dL   Total Protein 6.6  6.0 - 8.3 g/dL   Albumin 3.1 (*) 3.5 - 5.2 g/dL   AST 11  0 - 37 U/L   ALT 6  0 - 35 U/L   Alkaline Phosphatase 76  39 - 117 U/L   Total Bilirubin 0.3  0.3 - 1.2 mg/dL   GFR calc non Af Amer 52 (*) >90 mL/min   GFR calc Af Amer 61 (*) >90 mL/min   Comment: (NOTE)     The eGFR has been calculated using the CKD EPI equation.     This calculation has not been validated in all clinical situations.     eGFR's  persistently <90 mL/min signify possible Chronic Kidney     Disease.  TROPONIN I     Status: None   Collection Time    04/08/13  5:14 PM      Result Value Range   Troponin I <0.30  <0.30 ng/mL   Comment:            Due to the release kinetics of cTnI,     a negative result within the first hours     of the onset of symptoms does not rule out     myocardial infarction with certainty.     If myocardial infarction is still suspected,     repeat the test at appropriate intervals.  CBC WITH DIFFERENTIAL     Status: Abnormal   Collection Time    04/08/13  5:14 PM      Result Value Range   WBC 9.4  4.0 - 10.5 K/uL   RBC 4.23  3.87 - 5.11 MIL/uL   Hemoglobin 10.7 (*) 12.0 - 15.0 g/dL   HCT 35.9 (*) 36.0 - 46.0 %   MCV 84.9  78.0 - 100.0 fL   MCH 25.3 (*) 26.0 - 34.0 pg   MCHC 29.8 (*) 30.0 - 36.0 g/dL   RDW 17.1 (*) 11.5 - 15.5 %   Platelets 172  150 - 400 K/uL   Neutrophils Relative % 70  43 - 77 %   Neutro Abs 6.6  1.7 - 7.7 K/uL   Lymphocytes Relative 16  12 - 46 %   Lymphs Abs 1.5  0.7 - 4.0 K/uL   Monocytes Relative 11  3 - 12 %   Monocytes Absolute 1.0  0.1 - 1.0 K/uL   Eosinophils Relative 3  0 - 5 %   Eosinophils Absolute 0.3  0.0 - 0.7 K/uL   Basophils Relative 0  0 - 1 %   Basophils Absolute 0.0  0.0 - 0.1 K/uL  PRO B NATRIURETIC PEPTIDE     Status:  Abnormal   Collection Time    04/08/13  5:14 PM      Result Value Range   Pro B Natriuretic peptide (BNP) 1980.0 (*) 0 - 125 pg/mL  CULTURE, BLOOD (ROUTINE X 2)     Status: None   Collection Time    04/08/13 10:30 PM      Result Value Range   Specimen Description BLOOD RIGHT HAND     Special Requests BOTTLES DRAWN AEROBIC AND ANAEROBIC 4CC EACH     Culture PENDING     Report Status PENDING    CULTURE, BLOOD (ROUTINE X 2)     Status: None   Collection Time    04/08/13 10:30 PM      Result Value Range   Specimen Description BLOOD LEFT HAND     Special Requests       Value: BOTTLES DRAWN AEROBIC AND ANAEROBIC AEB 5CC ANA 4CC   Culture PENDING     Report Status PENDING    LACTIC ACID, PLASMA     Status: None   Collection Time    04/08/13 10:30 PM      Result Value Range   Lactic Acid, Venous 0.8  0.5 - 2.2 mmol/L    Radiology Reports: Dg Chest 2 View  04/08/2013   CLINICAL DATA:  Left chest pain.  EXAM: CHEST  2 VIEW  COMPARISON:  01/24/2012  FINDINGS: Again noted are prominent central vascular structures and slightly prominent interstitial lung densities. Heart size is upper  limits of normal. Stable appearance of the left dual lead cardiac pacemaker. There is concern for focal densities in the lingula or right middle lobe based on the lateral view.  IMPRESSION: Focal densities along the anterior chest are probably involving the lingula. Findings could be associated with infection.  Evidence for central vascular congestion and similar to the prior examination.   Electronically Signed   By: Markus Daft M.D.   On: 04/08/2013 19:25   Ct Chest Wo Contrast  04/08/2013   CLINICAL DATA:  Cough and congestion  EXAM: CT CHEST WITHOUT CONTRAST  TECHNIQUE: Multidetector CT imaging of the chest was performed following the standard protocol without IV contrast.  COMPARISON:  Chest radiograph April 08, 2013  FINDINGS: There is focal consolidation in the posterior and inferior lingula. There is mild  consolidation in the right middle lobe adjacent to the right heart border. There is also patchy infiltrate in the posterior segment of the left lower lobe.  On axial slice 33, series 3, there is a nodular opacity in the superior segment of the right lower lobe measuring 5 mm. There is a granuloma in the medial right base which is calcified.  There are pacemaker leads attached to the right atrium and right ventricle. Heart is mildly enlarged. The pericardium is not thickened.  There is atherosclerotic change in the aorta but no apparent aneurysm. There is no demonstrable adenopathy.  Visualized upper abdominal structures appear unremarkable except for atherosclerotic change and absence of the gallbladder. There are no blastic or lytic bone lesions. There is calcification in the left lobe of the thyroid.  IMPRESSION: Areas of infiltrate bilaterally, more on the left than on the right.  5 mm nodular opacity in the superior segment of right lower lobe. Followup of this nodular opacity should be based on Fleischner Society guidelines. If the patient is at high risk for bronchogenic carcinoma, follow-up chest CT at 6-12 months is recommended. If the patient is at low risk for bronchogenic carcinoma, follow-up chest CT at 12 months is recommended. This recommendation follows the consensus statement: Guidelines for Management of Small Pulmonary Nodules Detected on CT Scans: A Statement from the [place] as published in Radiology 2005;237:395-400.  Pacemaker leads as described. Mild cardiac enlargement. Areas of atherosclerotic change.  No adenopathy.  There is calcification in the left lobe of the thyroid. The etiology of this calcification is uncertain. Nonemergent thyroid ultrasound advised to further assess.   Electronically Signed   By: Lowella Grip M.D.   On: 04/08/2013 20:42    Electrocardiogram: Paced rhythm  Problem List  Principal Problem:   CAP (community acquired pneumonia) Active  Problems:   Cardiac pacemaker in situ   Smoking hx   Acute respiratory failure with hypoxia   COPD (chronic obstructive pulmonary disease)   Assessment: This is a 68 year old, Caucasian female, presents with chest pain. Chest pain, appears to be pleuritic in nature. She does have infiltrates on a chest x-ray, as well as on the CAT scan. She is febrile. Most likely she has pneumonia, which is probably community-acquired. Influenza is in the differential.  Plan: #1 chest pain: Most probably secondary to infectious process. Troponin is normal. Continue to get serial troponin. Treat pain with morphine.  #2 community-acquired pneumonia: She will be treated with ceftriaxone and azithromycin. We will wait on the influenza, PCR.  #3 acute respiratory failure with hypoxia: Most likely due to the pneumonia superimposed on COPD. She'll be placed on oxygen.  #4 history of COPD:  We will give her nebulizer treatments as she is wheezing. Hold off on steroids for now. If there is no improvement this may have to be considered in the morning.  #5 history of obstructive sleep apnea: CPAP at night.  #6 pulmonary nodule, and thyroid calcification noted on CT chest: This will need to be followed by her PCP, who will assume care in the morning.  #7 history of pacemaker placement for sick sinus syndrome: Heart rate is normal. Continue to monitor on telemetry.   DVT Prophylaxis: Lovenox Code Status: Full code Family Communication: Discussed with the patient  Disposition Plan: Admit to telemetry. Dr. Karie Kirks to assume care in AM.  Further management decisions will depend on results of further testing and patient's response to treatment.  [facility]  [facility] Pager [phone]  If 7PM-7AM, please contact night-coverage [email or web address] Password [email or web address]  04/08/2013, 11:14 PM

## 2013-04-08 NOTE — ED Notes (Signed)
Patient's BP elevated. Patient asking for something to drink.

## 2013-04-09 LAB — COMPREHENSIVE METABOLIC PANEL
ALK PHOS: 65 U/L (ref 39–117)
ALT: 5 U/L (ref 0–35)
AST: 9 U/L (ref 0–37)
Albumin: 2.7 g/dL — ABNORMAL LOW (ref 3.5–5.2)
BILIRUBIN TOTAL: 0.4 mg/dL (ref 0.3–1.2)
BUN: 10 mg/dL (ref 6–23)
CHLORIDE: 104 meq/L (ref 96–112)
CO2: 28 mEq/L (ref 19–32)
Calcium: 8.9 mg/dL (ref 8.4–10.5)
Creatinine, Ser: 0.96 mg/dL (ref 0.50–1.10)
GFR calc non Af Amer: 60 mL/min — ABNORMAL LOW (ref >90)
GFR, EST AFRICAN AMERICAN: 69 mL/min — AB (ref >90)
GLUCOSE: 155 mg/dL — AB (ref 70–99)
POTASSIUM: 3.8 meq/L (ref 3.7–5.3)
Sodium: 141 mEq/L (ref 137–147)
TOTAL PROTEIN: 6.1 g/dL (ref 6.0–8.3)

## 2013-04-09 LAB — CBC
HCT: 32.4 % — ABNORMAL LOW (ref 36.0–46.0)
Hemoglobin: 9.7 g/dL — ABNORMAL LOW (ref 12.0–15.0)
MCH: 25.5 pg — ABNORMAL LOW (ref 26.0–34.0)
MCHC: 29.9 g/dL — ABNORMAL LOW (ref 30.0–36.0)
MCV: 85 fL (ref 78.0–100.0)
PLATELETS: 141 10*3/uL — AB (ref 150–400)
RBC: 3.81 MIL/uL — ABNORMAL LOW (ref 3.87–5.11)
RDW: 17.3 % — AB (ref 11.5–15.5)
WBC: 9.9 10*3/uL (ref 4.0–10.5)

## 2013-04-09 LAB — TROPONIN I
Troponin I: <0.3 ng/mL (ref <0.30)
Troponin I: <0.3 ng/mL (ref <0.30)
Troponin I: <0.3 ng/mL (ref <0.30)

## 2013-04-09 LAB — INFLUENZA PANEL BY PCR (TYPE A & B)
H1N1 flu by pcr: NOT DETECTED
INFLAPCR: NEGATIVE
INFLBPCR: NEGATIVE

## 2013-04-09 LAB — HIV ANTIBODY (ROUTINE TESTING W REFLEX): HIV: NONREACTIVE

## 2013-04-09 MED ORDER — SODIUM CHLORIDE 0.9 % IV SOLN
INTRAVENOUS | Status: AC
  Administered 2013-04-09: 01:00:00 via INTRAVENOUS

## 2013-04-09 MED ORDER — SODIUM CHLORIDE 0.9 % IJ SOLN
3.0000 mL | Freq: Two times a day (BID) | INTRAMUSCULAR | Status: DC
  Administered 2013-04-09 – 2013-04-13 (×8): 3 mL via INTRAVENOUS

## 2013-04-09 MED ORDER — OXYCODONE HCL 5 MG PO TABS
5.0000 mg | ORAL_TABLET | ORAL | Status: DC | PRN
  Administered 2013-04-09 – 2013-04-15 (×7): 5 mg via ORAL
  Filled 2013-04-09 (×7): qty 1

## 2013-04-09 MED ORDER — ALBUTEROL SULFATE (2.5 MG/3ML) 0.083% IN NEBU
2.5000 mg | INHALATION_SOLUTION | RESPIRATORY_TRACT | Status: DC
  Administered 2013-04-09 (×2): 2.5 mg via RESPIRATORY_TRACT
  Filled 2013-04-09: qty 3

## 2013-04-09 MED ORDER — ASPIRIN EC 81 MG PO TBEC
81.0000 mg | DELAYED_RELEASE_TABLET | Freq: Two times a day (BID) | ORAL | Status: DC
  Administered 2013-04-09 – 2013-04-15 (×13): 81 mg via ORAL
  Filled 2013-04-09 (×13): qty 1

## 2013-04-09 MED ORDER — GUAIFENESIN ER 600 MG PO TB12
600.0000 mg | ORAL_TABLET | Freq: Two times a day (BID) | ORAL | Status: DC
  Administered 2013-04-09 – 2013-04-15 (×14): 600 mg via ORAL
  Filled 2013-04-09 (×15): qty 1

## 2013-04-09 MED ORDER — IPRATROPIUM-ALBUTEROL 0.5-2.5 (3) MG/3ML IN SOLN
3.0000 mL | RESPIRATORY_TRACT | Status: DC
  Administered 2013-04-09 – 2013-04-12 (×17): 3 mL via RESPIRATORY_TRACT
  Filled 2013-04-09 (×18): qty 3

## 2013-04-09 MED ORDER — ACETAMINOPHEN 650 MG RE SUPP: 650.0000 mg | Freq: Four times a day (QID) | RECTAL | Status: DC | PRN

## 2013-04-09 MED ORDER — DOCUSATE SODIUM 100 MG PO CAPS
100.0000 mg | ORAL_CAPSULE | Freq: Two times a day (BID) | ORAL | Status: DC
Start: 2013-04-09 — End: 2013-04-13
  Administered 2013-04-09 – 2013-04-13 (×9): 100 mg via ORAL
  Filled 2013-04-09 (×9): qty 1

## 2013-04-09 MED ORDER — DEXTROSE 5 % IV SOLN
1.0000 g | INTRAVENOUS | Status: DC
  Administered 2013-04-09 – 2013-04-14 (×6): 1 g via INTRAVENOUS
  Filled 2013-04-09 (×7): qty 10

## 2013-04-09 MED ORDER — SODIUM CHLORIDE 0.9 % IV SOLN: INTRAVENOUS | Status: AC

## 2013-04-09 MED ORDER — ONDANSETRON HCL 4 MG PO TABS: 4.0000 mg | ORAL_TABLET | Freq: Four times a day (QID) | ORAL | Status: DC | PRN

## 2013-04-09 MED ORDER — PANTOPRAZOLE SODIUM 40 MG PO TBEC
40.0000 mg | DELAYED_RELEASE_TABLET | Freq: Every morning | ORAL | Status: DC
  Administered 2013-04-09 – 2013-04-15 (×7): 40 mg via ORAL
  Filled 2013-04-09 (×7): qty 1

## 2013-04-09 MED ORDER — ALBUTEROL SULFATE (2.5 MG/3ML) 0.083% IN NEBU
2.5000 mg | INHALATION_SOLUTION | RESPIRATORY_TRACT | Status: DC | PRN
Start: 2013-04-09 — End: 2013-04-15
  Administered 2013-04-12 – 2013-04-14 (×2): 2.5 mg via RESPIRATORY_TRACT
  Filled 2013-04-09 (×2): qty 3

## 2013-04-09 MED ORDER — ENOXAPARIN SODIUM 40 MG/0.4ML ~~LOC~~ SOLN
40.0000 mg | SUBCUTANEOUS | Status: DC
  Administered 2013-04-09 – 2013-04-15 (×7): 40 mg via SUBCUTANEOUS
  Filled 2013-04-09 (×7): qty 0.4

## 2013-04-09 MED ORDER — ONDANSETRON HCL 4 MG/2ML IJ SOLN: 4.0000 mg | Freq: Four times a day (QID) | INTRAMUSCULAR | Status: DC | PRN

## 2013-04-09 MED ORDER — ACETAMINOPHEN 325 MG PO TABS: 650.0000 mg | ORAL_TABLET | Freq: Four times a day (QID) | ORAL | Status: DC | PRN

## 2013-04-09 MED ORDER — DEXTROSE 5 % IV SOLN
500.0000 mg | INTRAVENOUS | Status: DC
  Administered 2013-04-09 – 2013-04-13 (×4): 500 mg via INTRAVENOUS
  Filled 2013-04-09 (×6): qty 500

## 2013-04-09 MED ORDER — SIMVASTATIN 20 MG PO TABS
40.0000 mg | ORAL_TABLET | Freq: Every evening | ORAL | Status: DC
  Administered 2013-04-09 – 2013-04-14 (×5): 40 mg via ORAL
  Filled 2013-04-09 (×7): qty 2

## 2013-04-09 MED ORDER — IPRATROPIUM BROMIDE 0.02 % IN SOLN
RESPIRATORY_TRACT | Status: AC
  Filled 2013-04-09: qty 2.5

## 2013-04-09 MED ORDER — IPRATROPIUM BROMIDE 0.02 % IN SOLN
0.5000 mg | RESPIRATORY_TRACT | Status: DC
  Administered 2013-04-09 (×2): 0.5 mg via RESPIRATORY_TRACT
  Filled 2013-04-09: qty 2.5

## 2013-04-09 MED ORDER — MORPHINE SULFATE 2 MG/ML IJ SOLN: 2.0000 mg | INTRAMUSCULAR | Status: DC | PRN

## 2013-04-09 NOTE — Progress Notes (Signed)
Pt not agreeable to wear CPAP at this time;unit on standby if patient wishes to try again later;pt continues to be on 3Lpm [place].

## 2013-04-09 NOTE — Progress Notes (Signed)
Utilization Review Complete  

## 2013-04-09 NOTE — Care Management Note (Addendum)
    Page 1 of 1   04/12/2013     1:40:05 PM   CARE MANAGEMENT NOTE 04/12/2013  Patient:  [name], [name]   Account Number:  [id number]  Date Initiated:  04/09/2013  Documentation initiated by:  Claretha Cooper  Subjective/Objective Assessment:   Pt admitted from home where she lives with her son who assists her as needed. Pt will "think about" [facility] RN.     Action/Plan:   Anticipated DC Date:     Anticipated DC Plan:  HOME/SELF CARE      DC Planning Services  CM consult      Choice offered to / List presented to:             Status of service:  Completed, signed off Medicare Important Message given?   (If response is "NO", the following Medicare IM given date fields will be blank) Date Medicare IM given:   Date Additional Medicare IM given:    Discharge Disposition:  HOME/SELF CARE  Per UR Regulation:    If discussed at Long Length of Stay Meetings, dates discussed:    Comments:  04/12/13 Claretha Cooper RN BSN CM [place] declined per pt and son.  04/09/13 Elridge Stemm Dellia Nims RN BSN CM

## 2013-04-10 LAB — CBC
HCT: 30.5 % — ABNORMAL LOW (ref 36.0–46.0)
Hemoglobin: 9 g/dL — ABNORMAL LOW (ref 12.0–15.0)
MCH: 25.4 pg — AB (ref 26.0–34.0)
MCHC: 29.5 g/dL — AB (ref 30.0–36.0)
MCV: 85.9 fL (ref 78.0–100.0)
Platelets: 139 10*3/uL — ABNORMAL LOW (ref 150–400)
RBC: 3.55 MIL/uL — AB (ref 3.87–5.11)
RDW: 17.5 % — ABNORMAL HIGH (ref 11.5–15.5)
WBC: 9 10*3/uL (ref 4.0–10.5)

## 2013-04-10 LAB — BASIC METABOLIC PANEL
BUN: 10 mg/dL (ref 6–23)
CALCIUM: 9.2 mg/dL (ref 8.4–10.5)
CO2: 26 mEq/L (ref 19–32)
Chloride: 107 mEq/L (ref 96–112)
Creatinine, Ser: 0.92 mg/dL (ref 0.50–1.10)
GFR calc Af Amer: 73 mL/min — ABNORMAL LOW (ref >90)
GFR, EST NON AFRICAN AMERICAN: 63 mL/min — AB (ref >90)
Glucose, Bld: 137 mg/dL — ABNORMAL HIGH (ref 70–99)
Potassium: 4.4 mEq/L (ref 3.7–5.3)
SODIUM: 143 meq/L (ref 137–147)

## 2013-04-10 LAB — BLOOD GAS, ARTERIAL
Acid-Base Excess: 0.9 mmol/L (ref 0.0–2.0)
Bicarbonate: 26.2 mEq/L — ABNORMAL HIGH (ref 20.0–24.0)
DRAWN BY: 213101
O2 Content: 5 L/min
O2 Saturation: 87.3 %
PATIENT TEMPERATURE: 37
TCO2: 24.7 mmol/L (ref 0–100)
pCO2 arterial: 51 mmHg — ABNORMAL HIGH (ref 35.0–45.0)
pH, Arterial: 7.331 — ABNORMAL LOW (ref 7.350–7.450)
pO2, Arterial: 58 mmHg — ABNORMAL LOW (ref 80.0–100.0)

## 2013-04-10 LAB — STREP PNEUMONIAE URINARY ANTIGEN: Strep Pneumo Urinary Antigen: NEGATIVE

## 2013-04-10 MED ORDER — FUROSEMIDE 10 MG/ML IJ SOLN
40.0000 mg | Freq: Once | INTRAMUSCULAR | Status: AC
  Administered 2013-04-10: 40 mg via INTRAVENOUS
  Filled 2013-04-10: qty 4

## 2013-04-10 MED ORDER — METHYLPREDNISOLONE SODIUM SUCC 125 MG IJ SOLR
125.0000 mg | Freq: Four times a day (QID) | INTRAMUSCULAR | Status: DC
  Administered 2013-04-10 – 2013-04-12 (×9): 125 mg via INTRAVENOUS
  Filled 2013-04-10 (×10): qty 2

## 2013-04-10 NOTE — Progress Notes (Signed)
Patients RA stat 65% while at rest.  Patient still requiring 5L O2 for stats to be at 92%

## 2013-04-10 NOTE — Progress Notes (Signed)
[name]:[id number] DOB: 1945-12-04 DOA: 04/08/2013 PCP: Robert Bellow, MD   Subjective: This lady was admitted with bilateral pneumonia, community-acquired. She says that she had really been sick for 2-3 weeks prior to admission but did not seek medical care until she became very sick. She normally is not on any home oxygen. She is now currently requiring 4-5 L of oxygen per minute. She has not mobilize. She does feel better since hospitalization. She says she quit smoking a few months ago. Her husband passed away recently also.           Physical Exam: Blood pressure 120/73, pulse 58, temperature 99.2 F (37.3 C), temperature source Oral, resp. rate 20, height 5\' 2"  (1.575 m), weight 79.47 kg (175 lb 3.2 oz), SpO2 92.00%. She looks chronically sick. She does not have any respiratory distress at rest. There is no peripheral central cyanosis. Lung fields show reduced air entry in the right mid and lower zones and on the left base. There are a few crackles scattered. There is no bronchial breathing. There is some wheezing which is also scattered. She is alert and orientated.   Investigations:  Recent Results (from the past 240 hour(s))  CULTURE, BLOOD (ROUTINE X 2)     Status: None   Collection Time    04/08/13 10:30 PM      Result Value Range Status   Specimen Description BLOOD RIGHT HAND   Final   Special Requests BOTTLES DRAWN AEROBIC AND ANAEROBIC 4CC EACH   Final   Culture NO GROWTH 1 DAY   Final   Report Status PENDING   Incomplete  CULTURE, BLOOD (ROUTINE X 2)     Status: None   Collection Time    04/08/13 10:30 PM      Result Value Range Status   Specimen Description BLOOD LEFT HAND   Final   Special Requests     Final   Value: BOTTLES DRAWN AEROBIC AND ANAEROBIC AEB 5CC ANA 4CC   Culture NO GROWTH 1 DAY   Final   Report Status PENDING   Incomplete     Basic Metabolic Panel:  Recent Labs  04/09/13 0546 04/10/13 0532  NA 141 143  K 3.8 4.4  CL  104 107  CO2 28 26  GLUCOSE 155* 137*  BUN 10 10  CREATININE 0.96 0.92  CALCIUM 8.9 9.2   Liver Function Tests:  Recent Labs  04/08/13 1714 04/09/13 0546  AST 11 9  ALT 6 5  ALKPHOS 76 65  BILITOT 0.3 0.4  PROT 6.6 6.1  ALBUMIN 3.1* 2.7*     CBC:  Recent Labs  04/08/13 1714 04/09/13 0546 04/10/13 0532  WBC 9.4 9.9 9.0  NEUTROABS 6.6  --   --   HGB 10.7* 9.7* 9.0*  HCT 35.9* 32.4* 30.5*  MCV 84.9 85.0 85.9  PLT 172 141* 139*    Dg Chest 2 View  04/08/2013   CLINICAL DATA:  Left chest pain.  EXAM: CHEST  2 VIEW  COMPARISON:  01/24/2012  FINDINGS: Again noted are prominent central vascular structures and slightly prominent interstitial lung densities. Heart size is upper limits of normal. Stable appearance of the left dual lead cardiac pacemaker. There is concern for focal densities in the lingula or right middle lobe based on the lateral view.  IMPRESSION: Focal densities along the anterior chest are probably involving the lingula. Findings could be associated with infection.  Evidence for central vascular congestion and similar to the prior examination.  Electronically Signed   By: Markus Daft M.D.   On: 04/08/2013 19:25   Ct Chest Wo Contrast  04/08/2013   CLINICAL DATA:  Cough and congestion  EXAM: CT CHEST WITHOUT CONTRAST  TECHNIQUE: Multidetector CT imaging of the chest was performed following the standard protocol without IV contrast.  COMPARISON:  Chest radiograph April 08, 2013  FINDINGS: There is focal consolidation in the posterior and inferior lingula. There is mild consolidation in the right middle lobe adjacent to the right heart border. There is also patchy infiltrate in the posterior segment of the left lower lobe.  On axial slice 33, series 3, there is a nodular opacity in the superior segment of the right lower lobe measuring 5 mm. There is a granuloma in the medial right base which is calcified.  There are pacemaker leads attached to the right atrium and right  ventricle. Heart is mildly enlarged. The pericardium is not thickened.  There is atherosclerotic change in the aorta but no apparent aneurysm. There is no demonstrable adenopathy.  Visualized upper abdominal structures appear unremarkable except for atherosclerotic change and absence of the gallbladder. There are no blastic or lytic bone lesions. There is calcification in the left lobe of the thyroid.  IMPRESSION: Areas of infiltrate bilaterally, more on the left than on the right.  5 mm nodular opacity in the superior segment of right lower lobe. Followup of this nodular opacity should be based on Fleischner Society guidelines. If the patient is at high risk for bronchogenic carcinoma, follow-up chest CT at 6-12 months is recommended. If the patient is at low risk for bronchogenic carcinoma, follow-up chest CT at 12 months is recommended. This recommendation follows the consensus statement: Guidelines for Management of Small Pulmonary Nodules Detected on CT Scans: A Statement from the [place] as published in Radiology 2005;237:395-400.  Pacemaker leads as described. Mild cardiac enlargement. Areas of atherosclerotic change.  No adenopathy.  There is calcification in the left lobe of the thyroid. The etiology of this calcification is uncertain. Nonemergent thyroid ultrasound advised to further assess.   Electronically Signed   By: Lowella Grip M.D.   On: 04/08/2013 20:42      Medications: I have reviewed the patient's current medications.  Impression: 1. Bilateral community-acquired pneumonia. 2. Tobacco abuse. 3. COPD with exacerbation, mild. 4. Status post pacemaker. 5. History of adult muscular dystrophy.     Plan: 1. Continue with intravenous antibiotics. 2. Add intravenous steroids. 3. Mobilize.  Consultants:  None.   Procedures:  None.   Antibiotics:  Intravenous Rocephin and intravenous Zithromax.                   Code Status: Full code.  Family  Communication: Discussed plan with patient at the bedside.   Disposition Plan: Home when medically stable, hopefully in the next 24-48 hours. She will probably require home oxygen.  Time spent: 20 minutes.   LOS: 2 days   Doree Albee Pager [phone]  04/10/2013, 8:30 AM

## 2013-04-10 NOTE — Progress Notes (Signed)
Patient refused to take zocor and ambulate this evening.  Had a coughing spell that produced a large amount of clear, thick sputum.  Patient states it wore her out, does not feel that she can take meds now, just wants to rest

## 2013-04-11 ENCOUNTER — Inpatient Hospital Stay (HOSPITAL_COMMUNITY): Payer: Medicare Other

## 2013-04-11 DIAGNOSIS — I369 Nonrheumatic tricuspid valve disorder, unspecified: Secondary | ICD-10-CM

## 2013-04-11 LAB — BLOOD GAS, ARTERIAL
Acid-Base Excess: 5.7 mmol/L — ABNORMAL HIGH (ref 0.0–2.0)
Bicarbonate: 30.9 mEq/L — ABNORMAL HIGH (ref 20.0–24.0)
Drawn by: 213101
O2 Content: 5 L/min
O2 Saturation: 91.7 %
PCO2 ART: 56.4 mmHg — AB (ref 35.0–45.0)
PO2 ART: 65.9 mmHg — AB (ref 80.0–100.0)
Patient temperature: 37
TCO2: 28.5 mmol/L (ref 0–100)
pH, Arterial: 7.358 (ref 7.350–7.450)

## 2013-04-11 LAB — CBC
HCT: 32.5 % — ABNORMAL LOW (ref 36.0–46.0)
Hemoglobin: 9.5 g/dL — ABNORMAL LOW (ref 12.0–15.0)
MCH: 24.6 pg — ABNORMAL LOW (ref 26.0–34.0)
MCHC: 29.2 g/dL — ABNORMAL LOW (ref 30.0–36.0)
MCV: 84.2 fL (ref 78.0–100.0)
PLATELETS: 179 10*3/uL (ref 150–400)
RBC: 3.86 MIL/uL — ABNORMAL LOW (ref 3.87–5.11)
RDW: 17 % — ABNORMAL HIGH (ref 11.5–15.5)
WBC: 9 10*3/uL (ref 4.0–10.5)

## 2013-04-11 LAB — COMPREHENSIVE METABOLIC PANEL
ALK PHOS: 97 U/L (ref 39–117)
ALT: 9 U/L (ref 0–35)
AST: 14 U/L (ref 0–37)
Albumin: 2.7 g/dL — ABNORMAL LOW (ref 3.5–5.2)
BUN: 11 mg/dL (ref 6–23)
CO2: 28 meq/L (ref 19–32)
Calcium: 10 mg/dL (ref 8.4–10.5)
Chloride: 100 mEq/L (ref 96–112)
Creatinine, Ser: 0.8 mg/dL (ref 0.50–1.10)
GFR, EST AFRICAN AMERICAN: 86 mL/min — AB (ref >90)
GFR, EST NON AFRICAN AMERICAN: 75 mL/min — AB (ref >90)
GLUCOSE: 245 mg/dL — AB (ref 70–99)
POTASSIUM: 3.9 meq/L (ref 3.7–5.3)
Sodium: 141 mEq/L (ref 137–147)
TOTAL PROTEIN: 6.9 g/dL (ref 6.0–8.3)
Total Bilirubin: 0.2 mg/dL — ABNORMAL LOW (ref 0.3–1.2)

## 2013-04-11 LAB — LEGIONELLA ANTIGEN, URINE: LEGIONELLA ANTIGEN, URINE: NEGATIVE

## 2013-04-11 LAB — GLUCOSE, CAPILLARY: GLUCOSE-CAPILLARY: 278 mg/dL — AB (ref 70–99)

## 2013-04-11 MED ORDER — FUROSEMIDE 10 MG/ML IJ SOLN
40.0000 mg | Freq: Two times a day (BID) | INTRAMUSCULAR | Status: DC
  Administered 2013-04-11 – 2013-04-14 (×7): 40 mg via INTRAVENOUS
  Filled 2013-04-11 (×7): qty 4

## 2013-04-11 NOTE — Evaluation (Signed)
Physical Therapy Evaluation Patient Details Name: [name] MRN: [id number] DOB: March 30, 1946 Today's Date: 04/11/2013 Time: 2841-3244 PT Time Calculation (min): 27 min  PT Assessment / Plan / Recommendation History of Present Illness  Pt with a hx of adult muscular dystrophy, COPD and narcolepsy is admitted with pneumonia.  She lives with her son... her husband expired on 04-02-13.  Pt states that she is dependent on a w/c and usually "furniture walks" inside of the home although she does have a walker.  She is not normally on supplemental O2 but states "I have probably neeeed it".  She states that she doesn't need any therapy as she is doing OK at home.  Clinical Impression   Pt was finally agreeable to working with me but we were very limited by tenuous O2 sats on 6 L/min.  With any activity (even speaking) her O2 sats decrease to the low to mid 80s.  Her bed mobility and transfer to a chair were done without assist but she was not pushed to try to walk because of low sats.  I spoke with RN about my concerns.  We will work with her here in the hospital to maximize her activity  As possible.    PT Assessment  Patient needs continued PT services    Follow Up Recommendations  Home health PT    Does the patient have the potential to tolerate intense rehabilitation      Barriers to Discharge        Equipment Recommendations  None recommended by PT    Recommendations for Other Services     Frequency Min 3X/week    Precautions / Restrictions Precautions Precautions: Fall Restrictions Weight Bearing Restrictions: No   Pertinent Vitals/Pain       Mobility  Bed Mobility Overal bed mobility: Modified Independent Transfers Overall transfer level: Modified independent General transfer comment: O2 sats down in the low 80s with any activity...even speaking so we did not try to progress to gait    Exercises General Exercises - Lower Extremity Long Arc Quad: AROM;Both;15  reps;Seated   PT Diagnosis: Difficulty walking;Generalized weakness  PT Problem List: Decreased strength;Decreased activity tolerance;Decreased mobility;Cardiopulmonary status limiting activity;Obesity PT Treatment Interventions: Gait training;Functional mobility training;Therapeutic exercise     PT Goals(Current goals can be found in the care plan section) Acute Rehab PT Goals Patient Stated Goal: return to home PT Goal Formulation: With patient Time For Goal Achievement: 04/25/13 Potential to Achieve Goals: Good  Visit Information  Last PT Received On: 04/11/13 History of Present Illness: Pt with a hx of adult muscular dystrophy, COPD and narcolepsy is admitted with pneumonia.  She lives with her son... her husband expired on 04-02-13.  Pt states that she is dependent on a w/c and usually "furniture walks" inside of the home although she does have a walker.  She is not normally on supplemental O2 but states "I have probably neeeed it".  She states that she doesn't need any therapy as she is doing OK at home.       Prior [place] expects to be discharged to:: Private residence Living Arrangements: Children Available Help at Discharge: Family;Available 24 hours/day Type of Home: House Home Access: Ramped entrance Home Layout: One level Home Equipment: Walker - 2 wheels;Bedside commode;Shower seat Additional Comments: pt has a borrowed w/c but feels that she needs a power w/c as she is unable to propel a standard w/c Prior Function Level of Independence: Independent Communication Communication: No difficulties  Cognition  Cognition Arousal/Alertness: Awake/alert Behavior During Therapy: WFL for tasks assessed/performed Overall Cognitive Status: Within Functional Limits for tasks assessed    Extremity/Trunk Assessment Lower Extremity Assessment Lower Extremity Assessment: Generalized weakness   Balance Balance Overall balance assessment: No  apparent balance deficits (not formally assessed)  End of Session PT - End of Session Equipment Utilized During Treatment: Gait belt Activity Tolerance: Treatment limited secondary to medical complications (Comment) (decreasing O2 sat with any activity) Patient left: in chair;with call bell/phone within reach;with chair alarm set;with nursing/sitter in room Nurse Communication: Mobility status  GP     Sable Feil 04/11/2013, 10:37 AM

## 2013-04-11 NOTE — Progress Notes (Signed)
Patient still refusing CPAP.

## 2013-04-11 NOTE — Progress Notes (Signed)
*  PRELIMINARY RESULTS* Echocardiogram 2D Echocardiogram has been performed.  [place], [place] 04/11/2013, 3:09 PM

## 2013-04-11 NOTE — Progress Notes (Signed)
[name] KNL:[id number] DOB: Aug 03, 1945 DOA: 04/08/2013 PCP: Robert Bellow, MD   Subjective: This lady was admitted with bilateral pneumonia, community-acquired. She says that she had really been sick for 2-3 weeks prior to admission but did not seek medical care until she became very sick. She normally is not on any home oxygen. However, she told me that she has a CPAP machine at home which she does not use. I think she probably has sleep apnea. She is now currently requiring 4-5 L of oxygen per minute. She did not mobilize much yesterday. She does feel better since hospitalization. She says she quit smoking a few months ago. Her husband passed away recently also. Yesterday, she was more arousable. Arterial blood gas showed slight increase in PCO2 and hypoxemia.           Physical Exam: Blood pressure 128/84, pulse 108, temperature 97.4 F (36.3 C), temperature source Oral, resp. rate 20, height 5\' 2"  (1.575 m), weight 79.47 kg (175 lb 3.2 oz), SpO2 91.00%. She looks chronically sick. She does not have any respiratory distress at rest. There is no peripheral central cyanosis. There is bronchial breathing in the left mid and lower zones and reduced air entry in the right lower zone. There are a few crackles scattered. There is no bronchial breathing. There is some wheezing which is also scattered. She is alert and orientated.   Investigations:  Recent Results (from the past 240 hour(s))  CULTURE, BLOOD (ROUTINE X 2)     Status: None   Collection Time    04/08/13 10:30 PM      Result Value Range Status   Specimen Description BLOOD RIGHT HAND   Final   Special Requests BOTTLES DRAWN AEROBIC AND ANAEROBIC 4CC EACH   Final   Culture NO GROWTH 2 DAYS   Final   Report Status PENDING   Incomplete  CULTURE, BLOOD (ROUTINE X 2)     Status: None   Collection Time    04/08/13 10:30 PM      Result Value Range Status   Specimen Description BLOOD LEFT HAND   Final   Special Requests      Final   Value: BOTTLES DRAWN AEROBIC AND ANAEROBIC AEB=5CC ANA=4CC   Culture NO GROWTH 2 DAYS   Final   Report Status PENDING   Incomplete     Basic Metabolic Panel:  Recent Labs  04/10/13 0532 04/11/13 0452  NA 143 141  K 4.4 3.9  CL 107 100  CO2 26 28  GLUCOSE 137* 245*  BUN 10 11  CREATININE 0.92 0.80  CALCIUM 9.2 10.0   Liver Function Tests:  Recent Labs  04/09/13 0546 04/11/13 0452  AST 9 14  ALT 5 9  ALKPHOS 65 97  BILITOT 0.4 0.2*  PROT 6.1 6.9  ALBUMIN 2.7* 2.7*     CBC:  Recent Labs  04/08/13 1714  04/10/13 0532 04/11/13 0452  WBC 9.4  < > 9.0 9.0  NEUTROABS 6.6  --   --   --   HGB 10.7*  < > 9.0* 9.5*  HCT 35.9*  < > 30.5* 32.5*  MCV 84.9  < > 85.9 84.2  PLT 172  < > 139* 179  < > = values in this interval not displayed.  No results found.    Medications: I have reviewed the patient's current medications.  Impression: 1. Bilateral community-acquired pneumonia. 2. Tobacco abuse. 3. COPD with exacerbation, mild. 4. Status post pacemaker. 5. History of adult  muscular dystrophy. 6. Probable sleep apnea. 7. Concern over the possibility of congestive heart failure coexistent with her present problem.     Plan: 1. Continue with intravenous antibiotics. 2. Add intravenous steroids. 3. Echocardiogram. Check BNP in the morning. 4. Repeat chest x-ray this morning. 5. Physical therapy evaluation.  Consultants:  None.   Procedures:  None.   Antibiotics:  Intravenous Rocephin and intravenous Zithromax.                   Code Status: Full code.  Family Communication: Discussed plan with patient at the bedside.   Disposition Plan: Home when medically stable, hopefully in the next 24-48 hours. She will probably require home oxygen.  Time spent: 20 minutes.   LOS: 3 days   Doree Albee Pager [phone]  04/11/2013, 8:16 AM

## 2013-04-11 NOTE — Progress Notes (Signed)
Inpatient Diabetes Program Recommendations  AACE/ADA: New Consensus Statement on Inpatient Glycemic Control (2013)  Target Ranges:  Prepandial:   less than 140 mg/dL      Peak postprandial:   less than 180 mg/dL (1-2 hours)      Critically ill patients:  140 - 180 mg/dL   Results for LOLITHA, TORTORA (MRN [id number]) as of 04/11/2013 11:05  Ref. Range 04/10/2013 05:32 04/11/2013 04:52  Glucose Latest Range: 70-99 mg/dL 137 (H) 245 (H)    Inpatient Diabetes Program Recommendations Correction (SSI): Please ordering CBGs with Novolog correction ACHS while inpatient and receiving steroids. HgbA1C: Please order an A1C to evaluate glycemic control over the past 2-3 months.  Note: According to the chart, patient has a history of diabetes which is diet controlled.  Noted fasting lab glucose of 245 mg/dl this am.  While inpatient, please order CBGs with Novolog correction scale ACHS and and A1C.  Will continue to follow.  Thanks, Barnie Alderman, RN, MSN, CCRN Diabetes Coordinator Inpatient Diabetes Program [phone] (Team Pager) [phone] ([facility] office) [phone] [facility] office)

## 2013-04-12 LAB — GLUCOSE, CAPILLARY
GLUCOSE-CAPILLARY: 291 mg/dL — AB (ref 70–99)
GLUCOSE-CAPILLARY: 306 mg/dL — AB (ref 70–99)
Glucose-Capillary: 259 mg/dL — ABNORMAL HIGH (ref 70–99)

## 2013-04-12 LAB — BASIC METABOLIC PANEL
BUN: 20 mg/dL (ref 6–23)
CO2: 31 mEq/L (ref 19–32)
Calcium: 10.2 mg/dL (ref 8.4–10.5)
Chloride: 98 mEq/L (ref 96–112)
Creatinine, Ser: 0.79 mg/dL (ref 0.50–1.10)
GFR calc Af Amer: >90 mL/min (ref >90)
GFR calc non Af Amer: 84 mL/min — ABNORMAL LOW (ref >90)
Glucose, Bld: 305 mg/dL — ABNORMAL HIGH (ref 70–99)
POTASSIUM: 3.8 meq/L (ref 3.7–5.3)
SODIUM: 142 meq/L (ref 137–147)

## 2013-04-12 LAB — PRO B NATRIURETIC PEPTIDE: Pro B Natriuretic peptide (BNP): 5745 pg/mL — ABNORMAL HIGH (ref 0–125)

## 2013-04-12 MED ORDER — INSULIN ASPART 100 UNIT/ML ~~LOC~~ SOLN
0.0000 [IU] | Freq: Three times a day (TID) | SUBCUTANEOUS | Status: DC
  Administered 2013-04-12: 15 [IU] via SUBCUTANEOUS
  Administered 2013-04-12: 11 [IU] via SUBCUTANEOUS
  Administered 2013-04-13: 7 [IU] via SUBCUTANEOUS
  Administered 2013-04-13: 20 [IU] via SUBCUTANEOUS
  Administered 2013-04-13: 15 [IU] via SUBCUTANEOUS
  Administered 2013-04-14 (×2): 3 [IU] via SUBCUTANEOUS
  Administered 2013-04-14: 11 [IU] via SUBCUTANEOUS
  Administered 2013-04-15: 4 [IU] via SUBCUTANEOUS

## 2013-04-12 MED ORDER — METHYLPREDNISOLONE SODIUM SUCC 125 MG IJ SOLR
80.0000 mg | Freq: Four times a day (QID) | INTRAMUSCULAR | Status: DC
  Administered 2013-04-12 – 2013-04-13 (×4): 80 mg via INTRAVENOUS
  Filled 2013-04-12 (×4): qty 2

## 2013-04-12 MED ORDER — INSULIN ASPART 100 UNIT/ML ~~LOC~~ SOLN
0.0000 [IU] | Freq: Every day | SUBCUTANEOUS | Status: DC
  Administered 2013-04-12: 3 [IU] via SUBCUTANEOUS
  Administered 2013-04-13: 2 [IU] via SUBCUTANEOUS

## 2013-04-12 MED ORDER — IPRATROPIUM-ALBUTEROL 0.5-2.5 (3) MG/3ML IN SOLN
3.0000 mL | Freq: Four times a day (QID) | RESPIRATORY_TRACT | Status: DC
  Administered 2013-04-12 – 2013-04-13 (×6): 3 mL via RESPIRATORY_TRACT
  Filled 2013-04-12 (×6): qty 3

## 2013-04-12 NOTE — Progress Notes (Signed)
Physical Therapy Treatment Patient Details Name: GENEA RHEAUME MRN: [id number] DOB: 1945-04-16 Today's Date: 04/12/2013 Time: 6301-6010 PT Time Calculation (min): 30 min  PT Assessment / Plan / Recommendation  History of Present Illness Pt reports feeling much better.  She is now on a  50% ventimask and feels that she is breathing much easier.   PT Comments   Pt O2 sat on ventimask is 95% at rest.  She is able to ambulate short distances with a walker (up to 25') and is stable.  She adamantly refuses SNF nor does she wish to have HHPT.  I have asked nursing service to place a walker in the room and assist pt in ambulating to the bathroom.  Follow Up Recommendations   (pt is now declining HHPT)     Does the patient have the potential to tolerate intense rehabilitation     Barriers to Discharge        Equipment Recommendations       Recommendations for Other Services    Frequency     Progress towards PT Goals Progress towards PT goals: Progressing toward goals  Plan Current plan remains appropriate    Precautions / Restrictions     Pertinent Vitals/Pain     Mobility  Bed Mobility Overal bed mobility: Modified Independent Transfers Overall transfer level: Modified independent Ambulation/Gait Ambulation/Gait assistance: Modified independent (Device/Increase time) Ambulation Distance (Feet): 25 Feet Assistive device: Rolling walker (2 wheeled) Gait Pattern/deviations: WFL(Within Functional Limits) General Gait Details: O2 sat to 88% after gait    Exercises General Exercises - Lower Extremity Long Arc Quad: AROM;Both;10 reps;Seated Hip Flexion/Marching: AROM;Both;10 reps;Seated Toe Raises: AROM;Both;10 reps;Seated   PT Diagnosis:    PT Problem List:   PT Treatment Interventions:     PT Goals (current goals can now be found in the care plan section)    Visit Information  Last PT Received On: 04/12/13 History of Present Illness: Pt reports feeling much better.  She  is now on a  50% ventimask and feels that she is breathing much easier.    Subjective Data      Cognition       Balance     End of Session PT - End of Session Equipment Utilized During Treatment: Gait belt Activity Tolerance: Patient tolerated treatment well Patient left: in chair;with call bell/phone within reach;with chair alarm set Nurse Communication: Mobility status   GP     Sable Feil 04/12/2013, 4:15 PM

## 2013-04-12 NOTE — Progress Notes (Signed)
Pt placed on BIPAP 10/5 with 5lpm bled into unit;saturation 94% HR 74.

## 2013-04-13 LAB — GLUCOSE, CAPILLARY
GLUCOSE-CAPILLARY: 320 mg/dL — AB (ref 70–99)
GLUCOSE-CAPILLARY: 372 mg/dL — AB (ref 70–99)
Glucose-Capillary: 212 mg/dL — ABNORMAL HIGH (ref 70–99)

## 2013-04-13 LAB — CULTURE, BLOOD (ROUTINE X 2)
Culture: NO GROWTH
Culture: NO GROWTH

## 2013-04-13 MED ORDER — DOCUSATE SODIUM 100 MG PO CAPS
200.0000 mg | ORAL_CAPSULE | Freq: Two times a day (BID) | ORAL | Status: DC
  Administered 2013-04-13 – 2013-04-14 (×2): 200 mg via ORAL
  Filled 2013-04-13 (×3): qty 2

## 2013-04-13 MED ORDER — DEXAMETHASONE 4 MG PO TABS
2.0000 mg | ORAL_TABLET | Freq: Two times a day (BID) | ORAL | Status: DC
  Administered 2013-04-13 – 2013-04-14 (×4): 2 mg via ORAL
  Filled 2013-04-13 (×5): qty 0.5

## 2013-04-13 MED ORDER — AZITHROMYCIN 250 MG PO TABS
500.0000 mg | ORAL_TABLET | Freq: Every day | ORAL | Status: DC
  Administered 2013-04-13 – 2013-04-14 (×2): 500 mg via ORAL
  Filled 2013-04-13 (×2): qty 2

## 2013-04-13 MED ORDER — BISACODYL 10 MG RE SUPP
10.0000 mg | Freq: Every day | RECTAL | Status: DC | PRN
  Administered 2013-04-13: 10 mg via RECTAL
  Filled 2013-04-13: qty 1

## 2013-04-13 NOTE — Progress Notes (Signed)
Pt placed on her BIPAP with a different full face mask to see if pt is able to rest better than last night; there is 5lpm bled into the unit.

## 2013-04-13 NOTE — Progress Notes (Signed)
[name], [name]                ACCOUNT NO.:  [id number]  MEDICAL RECORD NO.:  [id number]  LOCATION:  V409                          FACILITY:  [facility]  PHYSICIAN:  Estill Bamberg. Karie Kirks, M.D.DATE OF BIRTH:  [birth year]  DATE OF PROCEDURE: DATE OF DISCHARGE:                                PROGRESS NOTE   SUBJECTIVE:  This patient was admitted with pneumonia.  She tells me she does feel somewhat better.  Her husband died on 04/15/23 after a long stay at a nursing home in [place].  She is still upset about this and feels that his treatment in the nursing home contributed to his death.  She denies smoking, but she did tell the physical therapist yesterday that she still does light up a cigarette, although, she will not puff on it.  She does have CPAP at home, but has not been using it.  She also has refused CPAP in the hospital.  OBJECTIVE:  GENERAL:  She is semi-recumbent in bed.  She is well- developed and overweight.  Her speech is normal.  Her sensorium is intact. VITAL SIGNS:  Temperature is 97.9, pulse 100, respiratory rate 20, blood pressure 149/70. RESPIRATORY:  She has decreased breath sounds throughout.  Egophony is noted at the bases.  She is moving air well, however, there are no intercostal retractions or use of accessory muscles of respiration.  She is in no respiratory distress today. CARDIOVASCULAR:  Heart has a fairly regular rhythm.  Rate of 90 with decreased heart sounds. EXTREMITIES:  There is no edema of the ankles.  LABORATORY DATA:  I reviewed her recent blood work.  Her blood gas showed a pH of 7.35, pCO2 of 56, pO2 of 65, and O2 sat 91%.  Her white cell count was 9000 and hemoglobin 9.5, down from 10.7.  Her chemistry showed a BUN 28, creatinine 0.79.  Her BNP has increased from 1980 to 5745.  Her glucoses were 245 and 305 when checked recently, but she was on steroids.  On admission, she had a glucose of 132.  Her blood cultures x2 are  negative at 4 days.  A repeat chest x-ray yesterday showed low lung volumes with bibasilar atelectasis and probable small pleural effusions as well as enlargement of cardiac silhouette, pulmonary vascular congestion.  Her echocardiogram showed an ejection fraction of 60% to 65%, but grade 1 diastolic dysfunction.  The left atrium was moderately dilated and the right ventricle showed hypertrophy of the wall and mild dilatation of the cavity and systolic pressures in the pulmonary artery increased to 55 mmHg.  Her most recent EKG showed a PR interval of 280 milliseconds with left bundle-branch block.  ASSESSMENT: 1. Pneumonia, improved. 2. Chronic obstructive pulmonary disease. 3. Pulmonary hypertension probably secondary to chronic obstructive     pulmonary disease -- cor pulmonale. 4. First-degree atrioventricular block. 5. Uncontrolled diabetes. 6. Muscular dystrophy. 7. History of tobacco use.  PLAN:  I talked with physical therapy.  She was able to walk a small amount in the room yesterday, but her O2 sats dropped into the 80s.  I think that she would benefit from nursing home placement short-term, but she absolutely refuses  today.  At home, she will need to start using her CPAP, nebulizer, and home oxygen.  I ordered her to start using CPAP tonight.  Her diabetes will need to be better treated and is probably out of whack because of the use of  methylprednisolone 125 mg IV q.6 h.  Insulin will be increased and methylprednisolone dose will be decreased.  She will be continued on azithromycin, ceftriaxone, as well as ipratropium/albuterol neb treatments.     Estill Bamberg. Karie Kirks, M.D.     SDK/MEDQ  D:  04/12/2013  T:  04/12/2013  Job:  [id number]

## 2013-04-13 NOTE — Progress Notes (Signed)
PHARMACIST - PHYSICIAN COMMUNICATION DR:   Karie Kirks CONCERNING: Antibiotic IV to Oral Route Change Policy  RECOMMENDATION: This patient is receiving Azithromycin by the intravenous route.  Based on criteria approved by the Pharmacy and Therapeutics Committee, the antibiotic(s) is/are being converted to the equivalent oral dose form(s).   DESCRIPTION: These criteria include:  Patient being treated for a respiratory tract infection, urinary tract infection, cellulitis or clostridium difficile associated diarrhea if on metronidazole  The patient is not neutropenic and does not exhibit a GI malabsorption state  The patient is eating (either orally or via tube) and/or has been taking other orally administered medications for a least 24 hours  The patient is improving clinically and has a Tmax < 100.5  If you have questions about this conversion, please contact the Pharmacy Department  [x]   [phone] )  [name] []   [phone] )  [name]  []   [phone] )  [name] []   [phone] )  [place], [place], [name] [name] 04/13/2013 12:10 PM

## 2013-04-14 LAB — GLUCOSE, CAPILLARY
GLUCOSE-CAPILLARY: 148 mg/dL — AB (ref 70–99)
GLUCOSE-CAPILLARY: 272 mg/dL — AB (ref 70–99)
Glucose-Capillary: 210 mg/dL — ABNORMAL HIGH (ref 70–99)
Glucose-Capillary: 182 mg/dL — ABNORMAL HIGH (ref 70–99)

## 2013-04-14 MED ORDER — IPRATROPIUM-ALBUTEROL 0.5-2.5 (3) MG/3ML IN SOLN
3.0000 mL | Freq: Three times a day (TID) | RESPIRATORY_TRACT | Status: DC
  Administered 2013-04-14 – 2013-04-15 (×4): 3 mL via RESPIRATORY_TRACT
  Filled 2013-04-14 (×4): qty 3

## 2013-04-14 NOTE — Progress Notes (Signed)
NAMEKYLEIGH, [name]                ACCOUNT NO.:  [id number]  MEDICAL RECORD NO.:  [id number]  LOCATION:  W803                          FACILITY:  [facility]  PHYSICIAN:  Estill Bamberg. Karie Kirks, M.D.DATE OF BIRTH:  12-15-1945  DATE OF PROCEDURE: DATE OF DISCHARGE:                                PROGRESS NOTE   SUBJECTIVE:  She feels better today.  She slept better last night on the BiPAP and also this morning coughed up a copious amount of thick gray sputum.  She is breathing better since then.  She tells me that she has CPAP at home but she really has not used the CPAP in years.  Nursing tells me that she has had a problem with constipation, despite being on Colace 100 mg b.i.d.  The patient tells me that she usually uses 2 capsules twice a day of Colace.  OBJECTIVE:  VITAL SIGNS:  Temperature is 97.4, pulse 87, respiratory rate 20, blood pressure 113/71. GENERAL:  She is sitting up in bed. LUNGS:  Actually quite clear throughout the today. HEART:  Has a regular rhythm, rate of 80. ABDOMEN:  Soft. EXTREMITIES:  There is no edema of the ankles.  ASSESSMENT: 1. Pneumonia. 2. Chronic obstructive pulmonary disease. 3. Pulmonary hypertension. 4. First-degree atrioventricular block. 5. Diabetes, still with high sugars on steroids. 6. Muscular dystrophy. 7. Constipation.  PLAN:  I have increased her Colace to 200 mg b.i.d. and added a Dulcolax suppository.  I encouraged her to be up and around the room today.  She continues on a Ventimask versus O2 by nasal prongs.  She may be able to go home tomorrow on CPAP and antibiotics, home oxygen, and nebulized treatments. She already has O2 and a nebulizer at home.     Estill Bamberg. Karie Kirks, M.D.     SDK/MEDQ  D:  04/13/2013  T:  04/13/2013  Job:  [id number]

## 2013-04-14 NOTE — Progress Notes (Signed)
Pt placed on her BIPAP with 5lpm bled into unit,will continue to monitor patient.

## 2013-04-15 LAB — GLUCOSE, CAPILLARY
Glucose-Capillary: 112 mg/dL — ABNORMAL HIGH (ref 70–99)
Glucose-Capillary: 180 mg/dL — ABNORMAL HIGH (ref 70–99)

## 2013-04-15 MED ORDER — GUAIFENESIN ER 600 MG PO TB12: 600.0000 mg | ORAL_TABLET | Freq: Two times a day (BID) | ORAL | Status: AC

## 2013-04-15 MED ORDER — LEVOFLOXACIN 500 MG PO TABS: 500.0000 mg | ORAL_TABLET | Freq: Every day | ORAL | Status: AC

## 2013-04-15 MED ORDER — FUROSEMIDE 20 MG PO TABS: 20.0000 mg | ORAL_TABLET | Freq: Two times a day (BID) | ORAL | Status: DC

## 2013-04-15 NOTE — Progress Notes (Signed)
[name], SCHERMERHORN                ACCOUNT NO.:  [id number]  MEDICAL RECORD NO.:  [id number]  LOCATION:  O175                          FACILITY:  [facility]  PHYSICIAN:  Estill Bamberg. Karie Kirks, M.D.DATE OF BIRTH:  1945-07-04  DATE OF PROCEDURE: DATE OF DISCHARGE:                                PROGRESS NOTE   SUBJECTIVE:  She does feel somewhat better.  Last night, she had her best night's sleep she has had so far.  OBJECTIVE:  GENERAL:  She was dozing when I first came into the room. She woke right up and was able to tell me about how she felt, however. VITAL SIGNS: Temperature is 98.6, pulse 78, respiratory rate 20, blood pressure 138/74. LUNGS:  Clear throughout and there are no intercostal retractions or use of accessory muscles of respiration. HEART:  Has a regular rhythm, rate of about 70, but heart sounds are distant. NEUROLOGIC:  Her speech is normal.  Her sensorium is intact. SKIN:  Her color is good. EXTREMITIES:  There is no edema of the ankles.  ASSESSMENT: 1. Pneumonia. 2. Chronic obstructive pulmonary disease. 3. Pulmonary hypertension. 4. Diabetes, improved but still uncontrolled while she is on steroid.  PLAN: 1. I am decreasing her dexamethasone. 2. She does have CPAP and a home nebulizer but will need oxygen when     she goes home, which hopefully will be by tomorrow.     Estill Bamberg. Karie Kirks, M.D.     SDK/MEDQ  D:  04/14/2013  T:  04/15/2013  Job:  [id number]

## 2013-04-15 NOTE — Progress Notes (Signed)
Pt's O2 sat 86% on RA at rest.  Placed back on 4lpm via [place], O2 sat up to 94%.

## 2013-04-15 NOTE — Discharge Summary (Signed)
Physician Discharge Summary  Patient ID: [name] MRN: [id number] DOB/AGE: 04-21-1945 68 y.o. Primary Care Physician:KNOWLTON,STEPHEN D, MD Admit date: 04/08/2013 Discharge date: 04/15/2013    Discharge Diagnoses:  1. Bilateral community-acquired pneumonia. 2. Right-sided heart failure, improving. 3. COPD with exacerbation. 4. Tobacco abuse.     Medication List         albuterol (2.5 MG/3ML) 0.083% nebulizer solution  Commonly known as:  PROVENTIL  Take 2.5 mg by nebulization every 6 (six) hours as needed for shortness of breath.     aspirin EC 81 MG tablet  Take 81 mg by mouth 2 (two) times daily.     docusate sodium 100 MG capsule  Commonly known as:  COLACE  Take 100 mg by mouth daily as needed for constipation.     furosemide 20 MG tablet  Commonly known as:  LASIX  Take 1 tablet (20 mg total) by mouth 2 (two) times daily.     guaiFENesin 600 MG 12 hr tablet  Commonly known as:  MUCINEX  Take 1 tablet (600 mg total) by mouth 2 (two) times daily.     levofloxacin 500 MG tablet  Commonly known as:  LEVAQUIN  Take 1 tablet (500 mg total) by mouth daily.     oxyCODONE-acetaminophen 10-325 MG per tablet  Commonly known as:  PERCOCET  Take 1 tablet by mouth every 6 (six) hours as needed for pain.     pantoprazole 40 MG tablet  Commonly known as:  PROTONIX  Take 40 mg by mouth every morning.     simvastatin 40 MG tablet  Commonly known as:  ZOCOR  Take 40 mg by mouth every evening.        Discharged Condition: Stable.    Consults: None.  Significant Diagnostic Studies: Dg Chest 2 View  04/08/2013   CLINICAL DATA:  Left chest pain.  EXAM: CHEST  2 VIEW  COMPARISON:  01/24/2012  FINDINGS: Again noted are prominent central vascular structures and slightly prominent interstitial lung densities. Heart size is upper limits of normal. Stable appearance of the left dual lead cardiac pacemaker. There is concern for focal densities in the lingula or right middle  lobe based on the lateral view.  IMPRESSION: Focal densities along the anterior chest are probably involving the lingula. Findings could be associated with infection.  Evidence for central vascular congestion and similar to the prior examination.   Electronically Signed   By: Markus Daft M.D.   On: 04/08/2013 19:25   Ct Chest Wo Contrast  04/08/2013   CLINICAL DATA:  Cough and congestion  EXAM: CT CHEST WITHOUT CONTRAST  TECHNIQUE: Multidetector CT imaging of the chest was performed following the standard protocol without IV contrast.  COMPARISON:  Chest radiograph April 08, 2013  FINDINGS: There is focal consolidation in the posterior and inferior lingula. There is mild consolidation in the right middle lobe adjacent to the right heart border. There is also patchy infiltrate in the posterior segment of the left lower lobe.  On axial slice 33, series 3, there is a nodular opacity in the superior segment of the right lower lobe measuring 5 mm. There is a granuloma in the medial right base which is calcified.  There are pacemaker leads attached to the right atrium and right ventricle. Heart is mildly enlarged. The pericardium is not thickened.  There is atherosclerotic change in the aorta but no apparent aneurysm. There is no demonstrable adenopathy.  Visualized upper abdominal structures appear unremarkable except for  atherosclerotic change and absence of the gallbladder. There are no blastic or lytic bone lesions. There is calcification in the left lobe of the thyroid.  IMPRESSION: Areas of infiltrate bilaterally, more on the left than on the right.  5 mm nodular opacity in the superior segment of right lower lobe. Followup of this nodular opacity should be based on Fleischner Society guidelines. If the patient is at high risk for bronchogenic carcinoma, follow-up chest CT at 6-12 months is recommended. If the patient is at low risk for bronchogenic carcinoma, follow-up chest CT at 12 months is recommended. This  recommendation follows the consensus statement: Guidelines for Management of Small Pulmonary Nodules Detected on CT Scans: A Statement from the [employer] as published in Radiology 2005;237:395-400.  Pacemaker leads as described. Mild cardiac enlargement. Areas of atherosclerotic change.  No adenopathy.  There is calcification in the left lobe of the thyroid. The etiology of this calcification is uncertain. Nonemergent thyroid ultrasound advised to further assess.   Electronically Signed   By: Lowella Grip M.D.   On: 04/08/2013 20:42   Dg Chest Port 1 View  04/11/2013   CLINICAL DATA:  Abnormal blood gas results, pneumonia  EXAM: PORTABLE CHEST - 1 VIEW  COMPARISON:  Portable exam 0843 hr compared to 05/19/2013  FINDINGS: Left subclavian transvenous pacemaker leads project over right atrium and right ventricle.  Decreased lung volumes versus previous study.  Enlargement of cardiac silhouette with pulmonary vascular congestion.  Question minimal perihilar edema.  Underlying consolidation in left lower lobe not entirely excluded.  Bibasilar atelectasis and probable small pleural effusions.  No pneumothorax or acute osseous findings.  IMPRESSION: Low lung volumes with bibasilar atelectasis since probable small pleural effusions.  Enlargement of cardiac silhouette with pulmonary vascular congestion and probable mild perihilar edema.   Electronically Signed   By: Lavonia Dana M.D.   On: 04/11/2013 10:00      Recent Results (from the past 240 hour(s))  CULTURE, BLOOD (ROUTINE X 2)     Status: None   Collection Time    04/08/13 10:30 PM      Result Value Range Status   Specimen Description BLOOD RIGHT HAND   Final   Special Requests BOTTLES DRAWN AEROBIC AND ANAEROBIC 4CC EACH   Final   Culture NO GROWTH 5 DAYS   Final   Report Status 04/13/2013 FINAL   Final  CULTURE, BLOOD (ROUTINE X 2)     Status: None   Collection Time    04/08/13 10:30 PM      Result Value Range Status   Specimen  Description BLOOD LEFT HAND   Final   Special Requests     Final   Value: BOTTLES DRAWN AEROBIC AND ANAEROBIC AEB=5CC ANA=4CC   Culture NO GROWTH 5 DAYS   Final   Report Status 04/13/2013 FINAL   Final     Hospital Course: This a 68 year old lady presented to the hospital with symptoms of chest pain. Please see initial history as outlined below: HPI: [name] is a 68 y.o. female with a past with history of COPD, obstructive sleep apnea, narcolepsy, who has a pacemaker that was placed about 10 years ago for sick sinus syndrome, who was in her usual state of health of this morning when she started having chest pain in the left side under her breast radiating to her shoulder and arm pit. It was a sharp pain which would increase with deep breathing. It would come and go. She was  also short of breath. She had a cough and some chest congestion as well. She was noted to have a fever of 101F in the ED. She denies any sick contacts. She's up-to-date on her influenza and pneumonia vaccinations. She denies any nausea, vomiting. No urinary discomfort. No dizziness or lightheadedness. The pain was 10 out of 10 in intensity when she came in to the ED, and currently, is about 6-7/10 in intensity, but the pain is mostly located in her back now. She said she's had this back pain before and is usually relieved with BenGay. No precipitating, aggravating or relieving factors identified except as noted above. The patient was treated for community-acquired pneumonia with intravenous antibiotics. She also started to wheeze and it was felt that she had known for COPD exacerbation and was started on intravenous steroids, these were converted to oral dexamethasone later on. She did desaturate and required increasing supplementation of oxygen. It was clinically felt that she may have a degree of heart failure and she was started on intravenous diuretics. This seemed to improve her clinical status. Echocardiogram showed normal  ejection fraction and grade 1 diastolic dysfunction. However there was moderate pulmonary hypertension. She may have had an element of right ventricular failure in addition to her community-acquired pneumonia. She seems to have done well and feels better. However she is still requiring high flow supplemental oxygen. Since she is feeling better, she is keen to go home but does agree to have home oxygen and home health services including an Therapist, sports. Her oxygen will need to be monitored closely and she probably will require long-term oxygen. She is going to be sent home with further one-week course of oral Levaquin and she will continue with low-dose Lasix 20 mg twice a day. We'll follow her in the office in about one week's time.  Discharge Exam: Blood pressure 123/81, pulse 93, temperature 97.7 F (36.5 C), temperature source Oral, resp. rate 20, height 5\' 2"  (1.575 m), weight 159.4 kg (351 lb 6.6 oz), SpO2 93.00%. She looks chronically sick. Heart sounds are present without gallop rhythm or murmurs. Lung fields are essentially clear with just a few crackles at the left base, inspiratory in nature. There is no bronchial breathing or wheezing anymore. She is alert and oriented.  Disposition: Home.      Discharge Orders   Future Appointments Provider Department Dept Phone   04/18/2013 11:30 AM Sanda Klein, MD [facility] [phone]   Future Orders Complete By Expires   Diet - low sodium heart healthy  As directed    Increase activity slowly  As directed       Follow-up Information   Follow up with Robert Bellow, MD. Schedule an appointment as soon as possible for a visit in 1 week.   Specialty:  Family Medicine   Contact information:   [place] [id number] [phone]       Signed: Doree Albee Pager [phone]  04/15/2013, 8:25 AM

## 2013-04-18 ENCOUNTER — Ambulatory Visit: Payer: Medicare Other | Admitting: Cardiovascular Disease

## 2013-08-02 ENCOUNTER — Encounter (HOSPITAL_COMMUNITY): Payer: Self-pay | Admitting: Emergency Medicine

## 2013-08-02 ENCOUNTER — Emergency Department (HOSPITAL_COMMUNITY): Payer: Medicare Other

## 2013-08-02 ENCOUNTER — Inpatient Hospital Stay (HOSPITAL_COMMUNITY)
Admission: EM | Admit: 2013-08-02 | Discharge: 2013-08-06 | DRG: 194 | Disposition: A | Discharge status: Home / Self Care | Payer: Medicare Other | Attending: Internal Medicine | Admitting: Internal Medicine

## 2013-08-02 DIAGNOSIS — E669 Obesity, unspecified: Secondary | ICD-10-CM | POA: Diagnosis present

## 2013-08-02 DIAGNOSIS — J4489 Other specified chronic obstructive pulmonary disease: Secondary | ICD-10-CM | POA: Diagnosis present

## 2013-08-02 DIAGNOSIS — R778 Other specified abnormalities of plasma proteins: Secondary | ICD-10-CM

## 2013-08-02 DIAGNOSIS — K219 Gastro-esophageal reflux disease without esophagitis: Secondary | ICD-10-CM | POA: Diagnosis present

## 2013-08-02 DIAGNOSIS — G7111 Myotonic muscular dystrophy: Secondary | ICD-10-CM | POA: Diagnosis present

## 2013-08-02 DIAGNOSIS — R7989 Other specified abnormal findings of blood chemistry: Secondary | ICD-10-CM

## 2013-08-02 DIAGNOSIS — I495 Sick sinus syndrome: Secondary | ICD-10-CM | POA: Diagnosis present

## 2013-08-02 DIAGNOSIS — G4730 Sleep apnea, unspecified: Secondary | ICD-10-CM | POA: Diagnosis present

## 2013-08-02 DIAGNOSIS — E119 Type 2 diabetes mellitus without complications: Secondary | ICD-10-CM | POA: Diagnosis present

## 2013-08-02 DIAGNOSIS — E782 Mixed hyperlipidemia: Secondary | ICD-10-CM

## 2013-08-02 DIAGNOSIS — Z6827 Body mass index (BMI) 27.0-27.9, adult: Secondary | ICD-10-CM

## 2013-08-02 DIAGNOSIS — J9602 Acute respiratory failure with hypercapnia: Secondary | ICD-10-CM

## 2013-08-02 DIAGNOSIS — N390 Urinary tract infection, site not specified: Secondary | ICD-10-CM | POA: Diagnosis present

## 2013-08-02 DIAGNOSIS — D649 Anemia, unspecified: Secondary | ICD-10-CM | POA: Diagnosis present

## 2013-08-02 DIAGNOSIS — Z950 Presence of cardiac pacemaker: Secondary | ICD-10-CM | POA: Diagnosis present

## 2013-08-02 DIAGNOSIS — I959 Hypotension, unspecified: Secondary | ICD-10-CM

## 2013-08-02 DIAGNOSIS — K5900 Constipation, unspecified: Secondary | ICD-10-CM | POA: Diagnosis present

## 2013-08-02 DIAGNOSIS — J9601 Acute respiratory failure with hypoxia: Secondary | ICD-10-CM | POA: Diagnosis present

## 2013-08-02 DIAGNOSIS — R109 Unspecified abdominal pain: Secondary | ICD-10-CM

## 2013-08-02 DIAGNOSIS — M5137 Other intervertebral disc degeneration, lumbosacral region: Secondary | ICD-10-CM | POA: Diagnosis present

## 2013-08-02 DIAGNOSIS — R799 Abnormal finding of blood chemistry, unspecified: Secondary | ICD-10-CM

## 2013-08-02 DIAGNOSIS — A498 Other bacterial infections of unspecified site: Secondary | ICD-10-CM | POA: Diagnosis present

## 2013-08-02 DIAGNOSIS — J189 Pneumonia, unspecified organism: Principal | ICD-10-CM

## 2013-08-02 DIAGNOSIS — E785 Hyperlipidemia, unspecified: Secondary | ICD-10-CM | POA: Diagnosis present

## 2013-08-02 DIAGNOSIS — I739 Peripheral vascular disease, unspecified: Secondary | ICD-10-CM

## 2013-08-02 DIAGNOSIS — Z87891 Personal history of nicotine dependence: Secondary | ICD-10-CM

## 2013-08-02 DIAGNOSIS — R748 Abnormal levels of other serum enzymes: Secondary | ICD-10-CM | POA: Diagnosis present

## 2013-08-02 DIAGNOSIS — J449 Chronic obstructive pulmonary disease, unspecified: Secondary | ICD-10-CM | POA: Diagnosis present

## 2013-08-02 DIAGNOSIS — M51379 Other intervertebral disc degeneration, lumbosacral region without mention of lumbar back pain or lower extremity pain: Secondary | ICD-10-CM | POA: Diagnosis present

## 2013-08-02 DIAGNOSIS — R911 Solitary pulmonary nodule: Secondary | ICD-10-CM

## 2013-08-02 DIAGNOSIS — G7109 Other specified muscular dystrophies: Secondary | ICD-10-CM | POA: Diagnosis present

## 2013-08-02 HISTORY — DX: Other intervertebral disc degeneration, lumbar region: M51.36

## 2013-08-02 HISTORY — DX: Other intervertebral disc degeneration, lumbar region without mention of lumbar back pain or lower extremity pain: M51.369

## 2013-08-02 LAB — COMPREHENSIVE METABOLIC PANEL
ALBUMIN: 3.5 g/dL (ref 3.5–5.2)
ALK PHOS: 75 U/L (ref 39–117)
ALT: 8 U/L (ref 0–35)
AST: 15 U/L (ref 0–37)
BUN: 18 mg/dL (ref 6–23)
CO2: 26 mEq/L (ref 19–32)
Calcium: 10.3 mg/dL (ref 8.4–10.5)
Chloride: 105 mEq/L (ref 96–112)
Creatinine, Ser: 1.12 mg/dL — ABNORMAL HIGH (ref 0.50–1.10)
GFR calc Af Amer: 57 mL/min — ABNORMAL LOW (ref >90)
GFR calc non Af Amer: 49 mL/min — ABNORMAL LOW (ref >90)
Glucose, Bld: 148 mg/dL — ABNORMAL HIGH (ref 70–99)
POTASSIUM: 4.1 meq/L (ref 3.7–5.3)
SODIUM: 141 meq/L (ref 137–147)
TOTAL PROTEIN: 7 g/dL (ref 6.0–8.3)
Total Bilirubin: 0.4 mg/dL (ref 0.3–1.2)

## 2013-08-02 LAB — CBC WITH DIFFERENTIAL/PLATELET
BASOS ABS: 0 10*3/uL (ref 0.0–0.1)
Basophils Relative: 0 % (ref 0–1)
EOS ABS: 0.1 10*3/uL (ref 0.0–0.7)
Eosinophils Relative: 1 % (ref 0–5)
HCT: 37.8 % (ref 36.0–46.0)
Hemoglobin: 11.7 g/dL — ABNORMAL LOW (ref 12.0–15.0)
LYMPHS ABS: 2.1 10*3/uL (ref 0.7–4.0)
Lymphocytes Relative: 18 % (ref 12–46)
MCH: 27 pg (ref 26.0–34.0)
MCHC: 31 g/dL (ref 30.0–36.0)
MCV: 87.3 fL (ref 78.0–100.0)
Monocytes Absolute: 1.4 10*3/uL — ABNORMAL HIGH (ref 0.1–1.0)
Monocytes Relative: 12 % (ref 3–12)
NEUTROS PCT: 69 % (ref 43–77)
Neutro Abs: 8.1 10*3/uL — ABNORMAL HIGH (ref 1.7–7.7)
PLATELETS: 169 10*3/uL (ref 150–400)
RBC: 4.33 MIL/uL (ref 3.87–5.11)
RDW: 15.7 % — ABNORMAL HIGH (ref 11.5–15.5)
WBC: 11.7 10*3/uL — ABNORMAL HIGH (ref 4.0–10.5)

## 2013-08-02 LAB — URINALYSIS, ROUTINE W REFLEX MICROSCOPIC
Glucose, UA: NEGATIVE mg/dL
HGB URINE DIPSTICK: NEGATIVE
Nitrite: POSITIVE — AB
PH: 5.5 (ref 5.0–8.0)
Specific Gravity, Urine: 1.025 (ref 1.005–1.030)
Urobilinogen, UA: 0.2 mg/dL (ref 0.0–1.0)

## 2013-08-02 LAB — CREATININE, SERUM
CREATININE: 1 mg/dL (ref 0.50–1.10)
GFR calc Af Amer: 66 mL/min — ABNORMAL LOW (ref >90)
GFR calc non Af Amer: 57 mL/min — ABNORMAL LOW (ref >90)

## 2013-08-02 LAB — CBC
HCT: 35.3 % — ABNORMAL LOW (ref 36.0–46.0)
HEMOGLOBIN: 10.8 g/dL — AB (ref 12.0–15.0)
MCH: 26.8 pg (ref 26.0–34.0)
MCHC: 30.6 g/dL (ref 30.0–36.0)
MCV: 87.6 fL (ref 78.0–100.0)
PLATELETS: 139 10*3/uL — AB (ref 150–400)
RBC: 4.03 MIL/uL (ref 3.87–5.11)
RDW: 15.8 % — ABNORMAL HIGH (ref 11.5–15.5)
WBC: 9.5 10*3/uL (ref 4.0–10.5)

## 2013-08-02 LAB — URINE MICROSCOPIC-ADD ON

## 2013-08-02 LAB — CBG MONITORING, ED: Glucose-Capillary: 146 mg/dL — ABNORMAL HIGH (ref 70–99)

## 2013-08-02 LAB — MRSA PCR SCREENING: MRSA by PCR: NEGATIVE

## 2013-08-02 LAB — LACTIC ACID, PLASMA: Lactic Acid, Venous: 1.2 mmol/L (ref 0.5–2.2)

## 2013-08-02 LAB — LIPASE, BLOOD: Lipase: 19 U/L (ref 11–59)

## 2013-08-02 LAB — TROPONIN I
TROPONIN I: 0.66 ng/mL — AB (ref <0.30)
Troponin I: 0.95 ng/mL (ref <0.30)
Troponin I: 0.81 ng/mL (ref <0.30)

## 2013-08-02 LAB — GLUCOSE, CAPILLARY: Glucose-Capillary: 153 mg/dL — ABNORMAL HIGH (ref 70–99)

## 2013-08-02 LAB — PRO B NATRIURETIC PEPTIDE: Pro B Natriuretic peptide (BNP): 1472 pg/mL — ABNORMAL HIGH (ref 0–125)

## 2013-08-02 MED ORDER — ASPIRIN 81 MG PO CHEW
324.0000 mg | CHEWABLE_TABLET | Freq: Once | ORAL | Status: AC
  Administered 2013-08-02: 324 mg via ORAL
  Filled 2013-08-02: qty 4

## 2013-08-02 MED ORDER — ALBUTEROL SULFATE (2.5 MG/3ML) 0.083% IN NEBU
2.5000 mg | INHALATION_SOLUTION | Freq: Four times a day (QID) | RESPIRATORY_TRACT | Status: DC | PRN
  Administered 2013-08-02: 2.5 mg via RESPIRATORY_TRACT
  Filled 2013-08-02: qty 3

## 2013-08-02 MED ORDER — GUAIFENESIN ER 600 MG PO TB12
600.0000 mg | ORAL_TABLET | Freq: Two times a day (BID) | ORAL | Status: DC
  Administered 2013-08-02 – 2013-08-06 (×8): 600 mg via ORAL
  Filled 2013-08-02 (×12): qty 1

## 2013-08-02 MED ORDER — FUROSEMIDE 40 MG PO TABS
40.0000 mg | ORAL_TABLET | Freq: Every day | ORAL | Status: DC
  Administered 2013-08-02 – 2013-08-05 (×4): 40 mg via ORAL
  Filled 2013-08-02 (×5): qty 1

## 2013-08-02 MED ORDER — SODIUM CHLORIDE 0.9 % IV SOLN
INTRAVENOUS | Status: DC
  Administered 2013-08-02 – 2013-08-04 (×2): via INTRAVENOUS

## 2013-08-02 MED ORDER — OXYCODONE-ACETAMINOPHEN 10-325 MG PO TABS: 1.0000 | ORAL_TABLET | Freq: Four times a day (QID) | ORAL | Status: DC | PRN

## 2013-08-02 MED ORDER — SIMVASTATIN 20 MG PO TABS
40.0000 mg | ORAL_TABLET | Freq: Every evening | ORAL | Status: DC
  Administered 2013-08-03 – 2013-08-05 (×3): 40 mg via ORAL
  Filled 2013-08-02 (×5): qty 2

## 2013-08-02 MED ORDER — HEPARIN SODIUM (PORCINE) 5000 UNIT/ML IJ SOLN
5000.0000 [IU] | Freq: Three times a day (TID) | INTRAMUSCULAR | Status: DC
  Administered 2013-08-02 – 2013-08-06 (×12): 5000 [IU] via SUBCUTANEOUS
  Filled 2013-08-02 (×12): qty 1

## 2013-08-02 MED ORDER — FENTANYL CITRATE 0.05 MG/ML IJ SOLN
12.5000 ug | INTRAMUSCULAR | Status: DC | PRN
  Administered 2013-08-02: 12.5 ug via INTRAVENOUS
  Filled 2013-08-02: qty 2

## 2013-08-02 MED ORDER — PANTOPRAZOLE SODIUM 40 MG PO TBEC
40.0000 mg | DELAYED_RELEASE_TABLET | Freq: Every morning | ORAL | Status: DC
  Administered 2013-08-03 – 2013-08-06 (×4): 40 mg via ORAL
  Filled 2013-08-02 (×4): qty 1

## 2013-08-02 MED ORDER — OXYCODONE-ACETAMINOPHEN 5-325 MG PO TABS
1.0000 | ORAL_TABLET | Freq: Four times a day (QID) | ORAL | Status: DC | PRN
  Administered 2013-08-02 – 2013-08-03 (×3): 1 via ORAL
  Filled 2013-08-02 (×3): qty 1

## 2013-08-02 MED ORDER — SODIUM CHLORIDE 0.9 % IV BOLUS (SEPSIS)
500.0000 mL | Freq: Once | INTRAVENOUS | Status: AC
  Administered 2013-08-02: 500 mL via INTRAVENOUS

## 2013-08-02 MED ORDER — MORPHINE SULFATE 2 MG/ML IJ SOLN
2.0000 mg | INTRAMUSCULAR | Status: DC | PRN
  Administered 2013-08-02 – 2013-08-04 (×5): 2 mg via INTRAVENOUS
  Filled 2013-08-02 (×6): qty 1

## 2013-08-02 MED ORDER — SODIUM CHLORIDE 0.9 % IV SOLN
INTRAVENOUS | Status: DC
  Administered 2013-08-02: 13:00:00 via INTRAVENOUS

## 2013-08-02 MED ORDER — ASPIRIN EC 81 MG PO TBEC
81.0000 mg | DELAYED_RELEASE_TABLET | Freq: Two times a day (BID) | ORAL | Status: DC
  Administered 2013-08-02 – 2013-08-06 (×8): 81 mg via ORAL
  Filled 2013-08-02 (×12): qty 1

## 2013-08-02 MED ORDER — DEXTROSE 5 % IV SOLN
500.0000 mg | INTRAVENOUS | Status: DC
  Administered 2013-08-02 – 2013-08-04 (×3): 500 mg via INTRAVENOUS
  Filled 2013-08-02 (×2): qty 500

## 2013-08-02 MED ORDER — LEVOFLOXACIN IN D5W 750 MG/150ML IV SOLN
750.0000 mg | Freq: Once | INTRAVENOUS | Status: AC
  Administered 2013-08-02: 750 mg via INTRAVENOUS
  Filled 2013-08-02: qty 150

## 2013-08-02 MED ORDER — DEXTROSE 5 % IV SOLN
1.0000 g | INTRAVENOUS | Status: DC
  Administered 2013-08-02 – 2013-08-05 (×4): 1 g via INTRAVENOUS
  Filled 2013-08-02 (×4): qty 10

## 2013-08-02 MED ORDER — OXYCODONE HCL 5 MG PO TABS
5.0000 mg | ORAL_TABLET | Freq: Four times a day (QID) | ORAL | Status: DC | PRN
  Administered 2013-08-03 (×2): 5 mg via ORAL
  Filled 2013-08-02 (×2): qty 1

## 2013-08-02 MED ORDER — SODIUM CHLORIDE 0.9 % IV SOLN: INTRAVENOUS | Status: DC

## 2013-08-02 NOTE — ED Notes (Signed)
Dr Gosrani at bedside. 

## 2013-08-02 NOTE — Progress Notes (Signed)
PT wears CPAP at home but she decided she did not want wear CPAP tonight.

## 2013-08-02 NOTE — Progress Notes (Signed)
Late Entry: 1900 Patient to the floor in no apparent distress with RR and effort WDL. Patient is alert and communicating appropriately. Patient moved from ER stretcher to hospital bed and tolerated transition well. Patient connected to the cardiac monitor, BP, and pulse-oximeter.  2005 Patient provided a meal at the patient's request.

## 2013-08-02 NOTE — ED Provider Notes (Signed)
CSN: [id number]     Arrival date & time 08/02/13  1230 History   First MD Initiated Contact with Patient 08/02/13 1309     Chief Complaint  Patient presents with  . Back Pain     HPI Pt was seen at 1325. Per pt, c/o gradual onset and persistence of constant right flank "pain" that began yesterday. Describes the pain as "sharp," with radiation into the right side of her abd and right lower thorax area. Has been associated with moist cough for the past 3 days, as well as home temps to "100.5." Pt states the pain worsens with palpation of the area and body position changes. Denies dysuria/hematuria, no rash, no CP/palpitations, no SOB, no N/V/D, no abd pain.  Denies incont/retention of bowel or bladder, no saddle anesthesia, no focal motor weakness, no tingling/numbness in extremities, no falls/injury.    Past Medical History  Diagnosis Date  . Sleep apnea     uses cpap  . Peripheral vascular disease   . GERD (gastroesophageal reflux disease)   . Neuromuscular disorder     HX of MD  . Diabetes   . Sinus node dysfunction   . H/O cardiac pacemaker   . Tobacco abuse   . Hyperlipidemia   . SSS (sick sinus syndrome)   . DDD (degenerative disc disease), lumbar    Past Surgical History  Procedure Laterality Date  . Insert / replace / remove pacemaker    . Cholecystectomy    . Abdominal hysterectomy    . Tonsillectomy    . Tubal ligation    . Femoral artery stent  02/18/2011  . Esophagogastroduodenoscopy  04/13/2002    A short segment of salmon-colored epithelium in the distal esophagus  consistent with Barrett's esophagus, biopsied/ The remainder of the esophageal mucosa, stomach, and duodenum through the second portion appeared normal  . Colonoscopy  04/13/2002    Diminutive polyps in the rectum cold biopsied/removed/ The remainder of the rectal mucosa and colon appeared normal.    History  Substance Use Topics  . Smoking status: Former Smoker -- 0.50 packs/day for 50 years     Types: Cigarettes    Quit date: 02/02/2013  . Smokeless tobacco: Never Used  . Alcohol Use: No    Review of Systems ROS: Statement: All systems negative except as marked or noted in the HPI; Constitutional: +fever and chills. ; ; Eyes: Negative for eye pain, redness and discharge. ; ; ENMT: Negative for ear pain, hoarseness, nasal congestion, sinus pressure and sore throat. ; ; Cardiovascular: Negative for chest pain, palpitations, diaphoresis, dyspnea and peripheral edema. ; ; Respiratory: +cough. Negative for wheezing and stridor. ; ; Gastrointestinal: Negative for nausea, vomiting, diarrhea, abdominal pain, blood in stool, hematemesis, jaundice and rectal bleeding. . ; ; Genitourinary: +right flank pain. Negative for dysuria and hematuria. ; ; Musculoskeletal: Negative for back pain and neck pain. Negative for swelling and trauma.; ; Skin: Negative for pruritus, rash, abrasions, blisters, bruising and skin lesion.; ; Neuro: Negative for headache, lightheadedness and neck stiffness. Negative for weakness, altered level of consciousness , altered mental status, extremity weakness, paresthesias, involuntary movement, seizure and syncope.     Allergies  Prednisone  Home Medications   Prior to Admission medications   Medication Sig Start Date End Date Taking? Authorizing Provider  albuterol (PROVENTIL) (2.5 MG/3ML) 0.083% nebulizer solution Take 2.5 mg by nebulization every 6 (six) hours as needed for shortness of breath.    Yes Historical Provider, MD  aspirin EC  81 MG tablet Take 81 mg by mouth 2 (two) times daily.    Yes Historical Provider, MD  furosemide (LASIX) 40 MG tablet Take 40 mg by mouth.   Yes Historical Provider, MD  guaiFENesin (MUCINEX) 600 MG 12 hr tablet Take 1 tablet (600 mg total) by mouth 2 (two) times daily. 04/15/13  Yes Nimish Luther Parody, MD  oxyCODONE-acetaminophen (PERCOCET) 10-325 MG per tablet Take 1 tablet by mouth every 6 (six) hours as needed for pain.    Yes  Historical Provider, MD  pantoprazole (PROTONIX) 40 MG tablet Take 40 mg by mouth every morning.    Yes Historical Provider, MD  simvastatin (ZOCOR) 40 MG tablet Take 40 mg by mouth every evening.     Yes Historical Provider, MD   BP 111/78  Pulse 69  Temp(Src) 98.4 F (36.9 C) (Oral)  Resp 19  Wt 152 lb (68.947 kg)  SpO2 96% Physical Exam 1330: Physical examination:  Nursing notes reviewed; Vital signs and O2 SAT reviewed;  Constitutional: Well developed, Well nourished, Well hydrated, In no acute distress; Head:  Normocephalic, atraumatic; Eyes: EOMI, PERRL, No scleral icterus; ENMT: Mouth and pharynx normal, Mucous membranes moist; Neck: Supple, Full range of motion, No lymphadenopathy; Cardiovascular: Regular rate and rhythm, No gallop; Respiratory: Breath sounds coarse & equal bilaterally, No wheezes. +moist cough during exam. Speaking full sentences with ease, Normal respiratory effort/excursion; Chest: Nontender, Movement normal; Abdomen: Soft, Nontender, Nondistended, Normal bowel sounds; Genitourinary: No CVA tenderness; Spine:  No midline CS, TS, LS tenderness. +TTP right lumbar paraspinal muscles. No rash;; Extremities: Pulses normal, No tenderness, No edema, No calf edema or asymmetry.; Neuro: AA&Ox3, Major CN grossly intact.  Speech clear. No gross focal motor or sensory deficits in extremities.; Skin: Color normal, Warm, Dry.   ED Course  Procedures    EKG Interpretation   Date/Time:  Friday Aug 02 2013 12:52:56 EDT Ventricular Rate:  73 PR Interval:  264 QRS Duration: 154 QT Interval:  419 QTC Calculation: 462 R Axis:   -53 Text Interpretation:  Sinus rhythm with 1st degree A-V block Left axis  deviation Left bundle branch block Abnormal ECG When compared with ECG of  04/08/2013 No significant change was found Confirmed by [facility]  MD,  Nunzio Cory [phone]) on 08/02/2013 1:57:34 PM Also confirmed by [facility]  MD,  Nunzio Cory [phone]), editor [place]  CCT, BEVERLY ([id number])  on  08/02/2013  2:16:12 PM      MDM  MDM Reviewed: previous chart, nursing note and vitals Reviewed previous: labs, ECG and CT scan Interpretation: labs, ECG, x-ray and CT scan Total time providing critical care: 30-74 minutes. This excludes time spent performing separately reportable procedures and services. Consults: admitting MD and cardiology   CRITICAL CARE Performed by: Alfonzo Feller Total critical care time: 29 Critical care time was exclusive of separately billable procedures and treating other patients. Critical care was necessary to treat or prevent imminent or life-threatening deterioration. Critical care was time spent personally by me on the following activities: development of treatment plan with patient and/or surrogate as well as nursing, discussions with consultants, evaluation of patient's response to treatment, examination of patient, obtaining history from patient or surrogate, ordering and performing treatments and interventions, ordering and review of laboratory studies, ordering and review of radiographic studies, pulse oximetry and re-evaluation of patient's condition.  Results for orders placed during the hospital encounter of 08/02/13  CBC WITH DIFFERENTIAL      Result Value Ref Range   WBC 11.7 (*)  4.0 - 10.5 K/uL   RBC 4.33  3.87 - 5.11 MIL/uL   Hemoglobin 11.7 (*) 12.0 - 15.0 g/dL   HCT 37.8  36.0 - 46.0 %   MCV 87.3  78.0 - 100.0 fL   MCH 27.0  26.0 - 34.0 pg   MCHC 31.0  30.0 - 36.0 g/dL   RDW 15.7 (*) 11.5 - 15.5 %   Platelets 169  150 - 400 K/uL   Neutrophils Relative % 69  43 - 77 %   Neutro Abs 8.1 (*) 1.7 - 7.7 K/uL   Lymphocytes Relative 18  12 - 46 %   Lymphs Abs 2.1  0.7 - 4.0 K/uL   Monocytes Relative 12  3 - 12 %   Monocytes Absolute 1.4 (*) 0.1 - 1.0 K/uL   Eosinophils Relative 1  0 - 5 %   Eosinophils Absolute 0.1  0.0 - 0.7 K/uL   Basophils Relative 0  0 - 1 %   Basophils Absolute 0.0  0.0 - 0.1 K/uL  COMPREHENSIVE METABOLIC PANEL       Result Value Ref Range   Sodium 141  137 - 147 mEq/L   Potassium 4.1  3.7 - 5.3 mEq/L   Chloride 105  96 - 112 mEq/L   CO2 26  19 - 32 mEq/L   Glucose, Bld 148 (*) 70 - 99 mg/dL   BUN 18  6 - 23 mg/dL   Creatinine, Ser 1.12 (*) 0.50 - 1.10 mg/dL   Calcium 10.3  8.4 - 10.5 mg/dL   Total Protein 7.0  6.0 - 8.3 g/dL   Albumin 3.5  3.5 - 5.2 g/dL   AST 15  0 - 37 U/L   ALT 8  0 - 35 U/L   Alkaline Phosphatase 75  39 - 117 U/L   Total Bilirubin 0.4  0.3 - 1.2 mg/dL   GFR calc non Af Amer 49 (*) >90 mL/min   GFR calc Af Amer 57 (*) >90 mL/min  URINALYSIS, ROUTINE W REFLEX MICROSCOPIC      Result Value Ref Range   Color, Urine YELLOW  YELLOW   APPearance CLEAR  CLEAR   Specific Gravity, Urine 1.025  1.005 - 1.030   pH 5.5  5.0 - 8.0   Glucose, UA NEGATIVE  NEGATIVE mg/dL   Hgb urine dipstick NEGATIVE  NEGATIVE   Bilirubin Urine SMALL (*) NEGATIVE   Ketones, ur TRACE (*) NEGATIVE mg/dL   Protein, ur TRACE (*) NEGATIVE mg/dL   Urobilinogen, UA 0.2  0.0 - 1.0 mg/dL   Nitrite POSITIVE (*) NEGATIVE   Leukocytes, UA TRACE (*) NEGATIVE  LACTIC ACID, PLASMA      Result Value Ref Range   Lactic Acid, Venous 1.2  0.5 - 2.2 mmol/L  TROPONIN I      Result Value Ref Range   Troponin I 0.95 (*) <0.30 ng/mL  PRO B NATRIURETIC PEPTIDE      Result Value Ref Range   Pro B Natriuretic peptide (BNP) 1472.0 (*) 0 - 125 pg/mL  LIPASE, BLOOD      Result Value Ref Range   Lipase 19  11 - 59 U/L  URINE MICROSCOPIC-ADD ON      Result Value Ref Range   Squamous Epithelial / LPF MANY (*) RARE   WBC, UA 7-10  <3 WBC/hpf   RBC / HPF 0-2  <3 RBC/hpf   Bacteria, UA MANY (*) RARE   Urine-Other LESS THAN 10 mL OF URINE SUBMITTED  Ct Abdomen Pelvis Wo Contrast 08/02/2013   CLINICAL DATA:  Back pain.  Right flank pain.  EXAM: CT CHEST, ABDOMEN AND PELVIS WITHOUT CONTRAST  TECHNIQUE: Multidetector CT imaging of the chest, abdomen and pelvis was performed following the standard protocol without IV  contrast.  COMPARISON:  04/08/2013.  05/27/2012.  FINDINGS: CT CHEST FINDINGS  Pacemaker device the of the left subclavian vein and leads are noted.  No evidence of mediastinal hemorrhage. No abnormal adenopathy. Calcifications in the left thyroid gland are partially imaged.  Minimal aortic valve calcifications. Mild 3 vessel coronary artery calcifications.  No evidence of intramural hematoma in the ascending or descending aorta.  No pneumothorax or pleural effusion.  5 mm right lower lobe pulmonary nodule on image 32 is stable. New 5 mm pulmonary nodule in the right lower lobe on image 33 is noted. Minimal dependent atelectasis versus airspace disease in the right lower lobe has increased. Consolidation in the lingula has improved.  No acute bony deformity.  CT ABDOMEN AND PELVIS FINDINGS  No hydronephrosis. No ureteral calculus. Right renal exophytic hypodensity is stable.  Pancreas is atrophic.  Adrenal glands are unremarkable  Postcholecystectomy.  No free-fluid.  No abnormal adenopathy.  Degenerative changes in the lumbar spine.  IMPRESSION: Left thyroid calcifications are stable.  Bibasilar atelectasis versus airspace disease as described. It is improved in the lingula but worse at the right base.  Stable 5 mm right lower lobe pulmonary nodule. New 5 mm right lower lobe pulmonary nodule. If the patient is at high risk for bronchogenic carcinoma, follow-up chest CT at 6-12 months is recommended. If the patient is at low risk for bronchogenic carcinoma, follow-up chest CT at 12 months is recommended. This recommendation follows the consensus statement: Guidelines for Management of Small Pulmonary Nodules Detected on CT Scans: A Statement from the [employer] Society as published in Radiology 2005;237:395-400.  No evidence of urinary calculus or urinary obstruction in the abdomen or pelvis.   Electronically Signed   By: Maryclare Bean M.D.   On: 08/02/2013 14:35   Ct Chest Wo Contrast 08/02/2013   CLINICAL DATA:  Back  pain.  Right flank pain.  EXAM: CT CHEST, ABDOMEN AND PELVIS WITHOUT CONTRAST  TECHNIQUE: Multidetector CT imaging of the chest, abdomen and pelvis was performed following the standard protocol without IV contrast.  COMPARISON:  04/08/2013.  05/27/2012.  FINDINGS: CT CHEST FINDINGS  Pacemaker device the of the left subclavian vein and leads are noted.  No evidence of mediastinal hemorrhage. No abnormal adenopathy. Calcifications in the left thyroid gland are partially imaged.  Minimal aortic valve calcifications. Mild 3 vessel coronary artery calcifications.  No evidence of intramural hematoma in the ascending or descending aorta.  No pneumothorax or pleural effusion.  5 mm right lower lobe pulmonary nodule on image 32 is stable. New 5 mm pulmonary nodule in the right lower lobe on image 33 is noted. Minimal dependent atelectasis versus airspace disease in the right lower lobe has increased. Consolidation in the lingula has improved.  No acute bony deformity.  CT ABDOMEN AND PELVIS FINDINGS  No hydronephrosis. No ureteral calculus. Right renal exophytic hypodensity is stable.  Pancreas is atrophic.  Adrenal glands are unremarkable  Postcholecystectomy.  No free-fluid.  No abnormal adenopathy.  Degenerative changes in the lumbar spine.  IMPRESSION: Left thyroid calcifications are stable.  Bibasilar atelectasis versus airspace disease as described. It is improved in the lingula but worse at the right base.  Stable 5 mm right lower lobe pulmonary  nodule. New 5 mm right lower lobe pulmonary nodule. If the patient is at high risk for bronchogenic carcinoma, follow-up chest CT at 6-12 months is recommended. If the patient is at low risk for bronchogenic carcinoma, follow-up chest CT at 12 months is recommended. This recommendation follows the consensus statement: Guidelines for Management of Small Pulmonary Nodules Detected on CT Scans: A Statement from the [employer] as published in Radiology 2005;237:395-400.   No evidence of urinary calculus or urinary obstruction in the abdomen or pelvis.   Electronically Signed   By: Maryclare Bean M.D.   On: 08/02/2013 14:35   Dg Chest Portable 1 View 08/02/2013   CLINICAL DATA:  Back pain right chest pain recent with pneumonia  EXAM: PORTABLE CHEST - 1 VIEW  COMPARISON:  DG CHEST 1V PORT dated 04/11/2013; CT CHEST W/O CM dated 04/08/2013; DG CHEST 2 VIEW dated 04/08/2013  FINDINGS: Mild cardiac enlargement stable. Cardiac pacer in unchanged position. Allowing for rotation, no infiltrate or consolidation. No significant pleural effusion.  IMPRESSION: No acute abnormalities   Electronically Signed   By: Skipper Cliche M.D.   On: 08/02/2013 13:22    1415:  SBP 90; judicious IVF given with SBP increasing to 122. O2 Sats 89% R/A on arrival, increased ot 97% on O2 2L N/C. Troponin elevated. EKG without change from previous. Pt emphatically denies CP.  T/C to [facility] Cards Dr. Raliegh Ip, case discussed, including:  HPI, pertinent PM/SHx, VS/PE, dx testing, ED course and treatment:  Agreeable to consult, states pt OK to stay at [facility] for further workup.   1505:  Aorta continues without acute pathology today, as per previous CT C/A/P studies.  BNP elevated, but improved from previous. No overt CHF on CXR. RLL infiltrate and UTI on Udip. UC is pending. Will tx with IV abx, admit. T/C to [facility] Dr. Anastasio Champion, case discussed, including:  HPI, pertinent PM/SHx, VS/PE, dx testing, ED course and treatment:  Agreeable to admit, requests to write temporary orders, obtain tele bed to Dr. Vickey Sages service.   Alfonzo Feller, DO 08/03/13 1556

## 2013-08-02 NOTE — ED Notes (Signed)
CRITICAL VALUE ALERT  Critical value received:  Trop-0.81  Date of notification:  08/02/13  Time of notification:  1720  Critical value read back:yes  Nurse who received alert:  t Tavius Turgeon rn  MD notified (1st page):  Anastasio Champion  Time of first page:  1720  MD notified (2nd page):  Time of second page:  Responding MD:    Time MD responded:  [phone]

## 2013-08-02 NOTE — ED Notes (Signed)
Pt tolieted on bedpan and repositioned in bed.

## 2013-08-02 NOTE — ED Notes (Signed)
Pt refusing in and out cath at this time. Pt instructed to provide urine sample.

## 2013-08-02 NOTE — ED Notes (Signed)
Pt c/o pain started to right lower back then radiated into right flank area yesterday. Denies urinary changes. gen weakness noted. C/o nonprod cough x 3 days. nad

## 2013-08-02 NOTE — ED Notes (Signed)
Resting in bed with nad noted. Called ICU and spoke with [facility]- room is still in process of being cleaned.

## 2013-08-02 NOTE — Progress Notes (Signed)
68 yo F admitted with CAP & possible PNA.  She has been started on Rocephin & Zithromax.  Neither require renal dose adjustment.  Continue antibiotics as ordered by MD.  Pharmacy to sign off.  Please re-consult if needed.   Netta Cedars, PharmD, BCPS 08/02/2013@5 :19 PM

## 2013-08-02 NOTE — ED Notes (Signed)
CRITICAL VALUE ALERT  Critical value received:  Trop. 0.95   Date of notification:  45/1/15  Time of notification:  1352  Critical value read back: yes  Nurse who received alert: . Jerl Santos  MD notified (1st page):  mcmanus  Time of first page:  1352  MD notified (2nd page):  Time of second page:  Responding MD:  Towanda Malkin  Time MD responded:  [id number]

## 2013-08-02 NOTE — H&P (Signed)
[facility] History and Physical  AHLAYAH [name] JOA:[id number] DOB: 10/31/1945 DOA: 08/02/2013  Referring physician: Dr. Thurnell Garbe. PCP: Robert Bellow, MD   Chief Complaint: Right-sided pleuritic chest pain.  HPI: [name] is a 68 y.o. female  This is a 68 year old lady who presents with a 24-hour history of right sided pleuritic chest and back pain. She has not had much of a cough nor fever. She does not have significant dyspnea. She did not have any urinary symptoms. Evaluation in the emergency room shows that she might have a possible pneumonia. Her troponin level is also elevated. Urinalysis is consistent with UTI. She is now being admitted for further management.   Review of Systems:  Constitutional:  No weight loss, night sweats, Fevers, chills, fatigue.  HEENT:  No headaches, Difficulty swallowing,Tooth/dental problems,Sore throat,  No sneezing, itching, ear ache, nasal congestion, post nasal drip,  Cardio-vascular:  No chest pain, Orthopnea, PND, swelling in lower extremities, anasarca, dizziness, palpitations  GI:  No heartburn, indigestion, abdominal pain, nausea, vomiting, diarrhea, change in bowel habits, loss of appetite  Resp:  No shortness of breath with exertion or at rest. No excess mucus, no productive cough, No non-productive cough, No coughing up of blood.No change in color of mucus.No wheezing.No chest wall deformity  Skin:  no rash or lesions.  GU:  no dysuria, change in color of urine, no urgency or frequency. No flank pain.  Musculoskeletal:  No joint pain or swelling. No decreased range of motion. No back pain.  Psych:  No change in mood or affect. No depression or anxiety. No memory loss.   Past Medical History  Diagnosis Date  . Sleep apnea     uses cpap  . Peripheral vascular disease   . GERD (gastroesophageal reflux disease)   . Neuromuscular disorder     HX of MD  . Diabetes   . Sinus node dysfunction   . H/O cardiac pacemaker    . Tobacco abuse   . Hyperlipidemia   . SSS (sick sinus syndrome)   . DDD (degenerative disc disease), lumbar    Past Surgical History  Procedure Laterality Date  . Insert / replace / remove pacemaker    . Cholecystectomy    . Abdominal hysterectomy    . Tonsillectomy    . Tubal ligation    . Femoral artery stent  02/18/2011  . Esophagogastroduodenoscopy  04/13/2002    A short segment of salmon-colored epithelium in the distal esophagus  consistent with Barrett's esophagus, biopsied/ The remainder of the esophageal mucosa, stomach, and duodenum through the second portion appeared normal  . Colonoscopy  04/13/2002    Diminutive polyps in the rectum cold biopsied/removed/ The remainder of the rectal mucosa and colon appeared normal.   Social History:  reports that she quit smoking about 5 months ago. Her smoking use included Cigarettes. She has a 25 pack-year smoking history. She has never used smokeless tobacco. She reports that she does not drink alcohol or use illicit drugs.  Allergies  Allergen Reactions  . Prednisone Other (See Comments)    Chest pain    History reviewed. No pertinent family history.   Prior to Admission medications   Medication Sig Start Date End Date Taking? Authorizing Provider  albuterol (PROVENTIL) (2.5 MG/3ML) 0.083% nebulizer solution Take 2.5 mg by nebulization every 6 (six) hours as needed for shortness of breath.    Yes Historical Provider, MD  aspirin EC 81 MG tablet Take 81 mg by mouth 2 (two)  times daily.    Yes Historical Provider, MD  furosemide (LASIX) 40 MG tablet Take 40 mg by mouth.   Yes Historical Provider, MD  guaiFENesin (MUCINEX) 600 MG 12 hr tablet Take 1 tablet (600 mg total) by mouth 2 (two) times daily. 04/15/13  Yes Melinda Kinn Luther Parody, MD  oxyCODONE-acetaminophen (PERCOCET) 10-325 MG per tablet Take 1 tablet by mouth every 6 (six) hours as needed for pain.    Yes Historical Provider, MD  pantoprazole (PROTONIX) 40 MG tablet Take 40 mg  by mouth every morning.    Yes Historical Provider, MD  simvastatin (ZOCOR) 40 MG tablet Take 40 mg by mouth every evening.     Yes Historical Provider, MD   Physical Exam: Filed Vitals:   08/02/13 1600  BP: 94/51  Pulse: 69  Temp:   Resp: 19    BP 94/51  Pulse 69  Temp(Src) 98.4 F (36.9 C) (Oral)  Resp 19  Wt 68.947 kg (152 lb)  SpO2 93%  General:  Appears to be in pain from the right pleuritic chest pain. No increased work of breathing. Eyes: PERRL, normal lids, irises & conjunctiva ENT: grossly normal hearing, lips & tongue Neck: no LAD, masses or thyromegaly Cardiovascular: RRR, no m/r/g. No LE edema. No clinical evidence of heart failure.  Respiratory: Inspiratory crackles in the right mid and lower zones, to some extent in the left base. There is no bronchial breathing or pleural rub. There is no wheezing. Abdomen: soft, ntnd Skin: no rash or induration seen on limited exam Musculoskeletal: grossly normal tone BUE/BLE Psychiatric: grossly normal mood and affect, speech fluent and appropriate Neurologic: grossly non-focal.          Labs on Admission:  Basic Metabolic Panel:  Recent Labs Lab 08/02/13 1240  NA 141  K 4.1  CL 105  CO2 26  GLUCOSE 148*  BUN 18  CREATININE 1.12*  CALCIUM 10.3   Liver Function Tests:  Recent Labs Lab 08/02/13 1240  AST 15  ALT 8  ALKPHOS 75  BILITOT 0.4  PROT 7.0  ALBUMIN 3.5    Recent Labs Lab 08/02/13 1335  LIPASE 19    CBC:  Recent Labs Lab 08/02/13 1240  WBC 11.7*  NEUTROABS 8.1*  HGB 11.7*  HCT 37.8  MCV 87.3  PLT 169   Cardiac Enzymes:  Recent Labs Lab 08/02/13 1308  TROPONINI 0.95*    BNP (last 3 results)  Recent Labs  04/08/13 1714 04/12/13 0501 08/02/13 1335  PROBNP 1980.0* 5745.0* 1472.0*   CBG:   Radiological Exams on Admission: Ct Abdomen Pelvis Wo Contrast  08/02/2013   CLINICAL DATA:  Back pain.  Right flank pain.  EXAM: CT CHEST, ABDOMEN AND PELVIS WITHOUT CONTRAST   TECHNIQUE: Multidetector CT imaging of the chest, abdomen and pelvis was performed following the standard protocol without IV contrast.  COMPARISON:  04/08/2013.  05/27/2012.  FINDINGS: CT CHEST FINDINGS  Pacemaker device the of the left subclavian vein and leads are noted.  No evidence of mediastinal hemorrhage. No abnormal adenopathy. Calcifications in the left thyroid gland are partially imaged.  Minimal aortic valve calcifications. Mild 3 vessel coronary artery calcifications.  No evidence of intramural hematoma in the ascending or descending aorta.  No pneumothorax or pleural effusion.  5 mm right lower lobe pulmonary nodule on image 32 is stable. New 5 mm pulmonary nodule in the right lower lobe on image 33 is noted. Minimal dependent atelectasis versus airspace disease in the right lower lobe has  increased. Consolidation in the lingula has improved.  No acute bony deformity.  CT ABDOMEN AND PELVIS FINDINGS  No hydronephrosis. No ureteral calculus. Right renal exophytic hypodensity is stable.  Pancreas is atrophic.  Adrenal glands are unremarkable  Postcholecystectomy.  No free-fluid.  No abnormal adenopathy.  Degenerative changes in the lumbar spine.  IMPRESSION: Left thyroid calcifications are stable.  Bibasilar atelectasis versus airspace disease as described. It is improved in the lingula but worse at the right base.  Stable 5 mm right lower lobe pulmonary nodule. New 5 mm right lower lobe pulmonary nodule. If the patient is at high risk for bronchogenic carcinoma, follow-up chest CT at 6-12 months is recommended. If the patient is at low risk for bronchogenic carcinoma, follow-up chest CT at 12 months is recommended. This recommendation follows the consensus statement: Guidelines for Management of Small Pulmonary Nodules Detected on CT Scans: A Statement from the Fleischner Society as published in Radiology 2005;237:395-400.  No evidence of urinary calculus or urinary obstruction in the abdomen or pelvis.    Electronically Signed   By: Maryclare Bean M.D.   On: 08/02/2013 14:35   Ct Chest Wo Contrast  08/02/2013   CLINICAL DATA:  Back pain.  Right flank pain.  EXAM: CT CHEST, ABDOMEN AND PELVIS WITHOUT CONTRAST  TECHNIQUE: Multidetector CT imaging of the chest, abdomen and pelvis was performed following the standard protocol without IV contrast.  COMPARISON:  04/08/2013.  05/27/2012.  FINDINGS: CT CHEST FINDINGS  Pacemaker device the of the left subclavian vein and leads are noted.  No evidence of mediastinal hemorrhage. No abnormal adenopathy. Calcifications in the left thyroid gland are partially imaged.  Minimal aortic valve calcifications. Mild 3 vessel coronary artery calcifications.  No evidence of intramural hematoma in the ascending or descending aorta.  No pneumothorax or pleural effusion.  5 mm right lower lobe pulmonary nodule on image 32 is stable. New 5 mm pulmonary nodule in the right lower lobe on image 33 is noted. Minimal dependent atelectasis versus airspace disease in the right lower lobe has increased. Consolidation in the lingula has improved.  No acute bony deformity.  CT ABDOMEN AND PELVIS FINDINGS  No hydronephrosis. No ureteral calculus. Right renal exophytic hypodensity is stable.  Pancreas is atrophic.  Adrenal glands are unremarkable  Postcholecystectomy.  No free-fluid.  No abnormal adenopathy.  Degenerative changes in the lumbar spine.  IMPRESSION: Left thyroid calcifications are stable.  Bibasilar atelectasis versus airspace disease as described. It is improved in the lingula but worse at the right base.  Stable 5 mm right lower lobe pulmonary nodule. New 5 mm right lower lobe pulmonary nodule. If the patient is at high risk for bronchogenic carcinoma, follow-up chest CT at 6-12 months is recommended. If the patient is at low risk for bronchogenic carcinoma, follow-up chest CT at 12 months is recommended. This recommendation follows the consensus statement: Guidelines for Management of  Small Pulmonary Nodules Detected on CT Scans: A Statement from the Fleischner Society as published in Radiology 2005;237:395-400.  No evidence of urinary calculus or urinary obstruction in the abdomen or pelvis.   Electronically Signed   By: Maryclare Bean M.D.   On: 08/02/2013 14:35   Dg Chest Portable 1 View  08/02/2013   CLINICAL DATA:  Back pain right chest pain recent with pneumonia  EXAM: PORTABLE CHEST - 1 VIEW  COMPARISON:  DG CHEST 1V PORT dated 04/11/2013; CT CHEST W/O CM dated 04/08/2013; DG CHEST 2 VIEW dated 04/08/2013  FINDINGS: Mild  cardiac enlargement stable. Cardiac pacer in unchanged position. Allowing for rotation, no infiltrate or consolidation. No significant pleural effusion.  IMPRESSION: No acute abnormalities   Electronically Signed   By: Skipper Cliche M.D.   On: 08/02/2013 13:22    EKG: Independently reviewed. Left bundle branch block.  Assessment/Plan   1. Right lower lobe community-acquired pneumonia. 2. Urinary tract infection. 3. History of myotonic dystrophy. 4. Elevated troponin level, unclear significance. Her symptoms do not point to an acute coronary syndrome clinically. 5. COPD, stable. 6. Cardiac pacemaker in situ.  Plan: 1. Admit to telemetry. 2. Intravenous antibiotics. 3. Intravenous fluids. 4. Serial cardiac enzymes.  Further recommendations will depend on patient's hospital progress.   Code Status: Full code.  Family Communication: I discussed the plan with patient at the bedside  Disposition Plan: Home when medically stable.   Time spent: 60 minutes.  [facility] Pager [phone].

## 2013-08-03 LAB — CBC
HEMATOCRIT: 33.6 % — AB (ref 36.0–46.0)
HEMOGLOBIN: 10.3 g/dL — AB (ref 12.0–15.0)
MCH: 27 pg (ref 26.0–34.0)
MCHC: 30.7 g/dL (ref 30.0–36.0)
MCV: 88.2 fL (ref 78.0–100.0)
Platelets: 152 10*3/uL (ref 150–400)
RBC: 3.81 MIL/uL — ABNORMAL LOW (ref 3.87–5.11)
RDW: 15.7 % — ABNORMAL HIGH (ref 11.5–15.5)
WBC: 10.4 10*3/uL (ref 4.0–10.5)

## 2013-08-03 LAB — COMPREHENSIVE METABOLIC PANEL
ALT: 7 U/L (ref 0–35)
AST: 15 U/L (ref 0–37)
Albumin: 3.1 g/dL — ABNORMAL LOW (ref 3.5–5.2)
Alkaline Phosphatase: 78 U/L (ref 39–117)
BILIRUBIN TOTAL: 0.3 mg/dL (ref 0.3–1.2)
BUN: 16 mg/dL (ref 6–23)
CHLORIDE: 105 meq/L (ref 96–112)
CO2: 28 mEq/L (ref 19–32)
Calcium: 9.6 mg/dL (ref 8.4–10.5)
Creatinine, Ser: 1.18 mg/dL — ABNORMAL HIGH (ref 0.50–1.10)
GFR calc non Af Amer: 46 mL/min — ABNORMAL LOW (ref >90)
GFR, EST AFRICAN AMERICAN: 54 mL/min — AB (ref >90)
GLUCOSE: 130 mg/dL — AB (ref 70–99)
Potassium: 4 mEq/L (ref 3.7–5.3)
Sodium: 143 mEq/L (ref 137–147)
Total Protein: 6.5 g/dL (ref 6.0–8.3)

## 2013-08-03 LAB — GLUCOSE, CAPILLARY
Glucose-Capillary: 122 mg/dL — ABNORMAL HIGH (ref 70–99)
Glucose-Capillary: 154 mg/dL — ABNORMAL HIGH (ref 70–99)
Glucose-Capillary: 146 mg/dL — ABNORMAL HIGH (ref 70–99)

## 2013-08-03 LAB — TROPONIN I: Troponin I: 0.61 ng/mL (ref <0.30)

## 2013-08-03 MED ORDER — KETOCONAZOLE 2 % EX CREA
TOPICAL_CREAM | Freq: Two times a day (BID) | CUTANEOUS | Status: DC
  Administered 2013-08-03 – 2013-08-04 (×4): via TOPICAL
  Administered 2013-08-05: 1 via TOPICAL
  Administered 2013-08-05 – 2013-08-06 (×2): via TOPICAL
  Filled 2013-08-03: qty 15

## 2013-08-03 MED ORDER — ALBUTEROL SULFATE (2.5 MG/3ML) 0.083% IN NEBU
2.5000 mg | INHALATION_SOLUTION | Freq: Four times a day (QID) | RESPIRATORY_TRACT | Status: DC
  Administered 2013-08-03 – 2013-08-06 (×12): 2.5 mg via RESPIRATORY_TRACT
  Filled 2013-08-03 (×12): qty 3

## 2013-08-03 NOTE — Progress Notes (Signed)
MD notified of increase of O2 requirements to maintain O2 sat greater than 90% - pt currently on 5L O2 [name].  Pt currently watching tv and eating breakfast - pt notified pt of pain in left side.  Will continue to closely monitor.

## 2013-08-03 NOTE — Progress Notes (Signed)
Report given to Santiago Glad, RN on 300 for transfer to tele floor  Pt updated of move to a new room - states she's "very tired" but "feeling better".  No c/o pain/discomfort or acute issues prior to transfer via wheelchair.

## 2013-08-04 LAB — GLUCOSE, CAPILLARY
GLUCOSE-CAPILLARY: 204 mg/dL — AB (ref 70–99)
GLUCOSE-CAPILLARY: 143 mg/dL — AB (ref 70–99)
GLUCOSE-CAPILLARY: 173 mg/dL — AB (ref 70–99)
Glucose-Capillary: 240 mg/dL — ABNORMAL HIGH (ref 70–99)

## 2013-08-04 MED ORDER — MAGNESIUM HYDROXIDE 400 MG/5ML PO SUSP
30.0000 mL | Freq: Once | ORAL | Status: AC
  Administered 2013-08-04: 30 mL via ORAL
  Filled 2013-08-04: qty 30

## 2013-08-05 LAB — URINE CULTURE

## 2013-08-05 LAB — GLUCOSE, CAPILLARY
Glucose-Capillary: 158 mg/dL — ABNORMAL HIGH (ref 70–99)
Glucose-Capillary: 147 mg/dL — ABNORMAL HIGH (ref 70–99)
Glucose-Capillary: 173 mg/dL — ABNORMAL HIGH (ref 70–99)
Glucose-Capillary: 161 mg/dL — ABNORMAL HIGH (ref 70–99)
Glucose-Capillary: 161 mg/dL — ABNORMAL HIGH (ref 70–99)

## 2013-08-05 MED ORDER — SENNOSIDES-DOCUSATE SODIUM 8.6-50 MG PO TABS
1.0000 | ORAL_TABLET | Freq: Two times a day (BID) | ORAL | Status: DC
  Administered 2013-08-05: 1 via ORAL
  Filled 2013-08-05 (×2): qty 1

## 2013-08-05 NOTE — Progress Notes (Signed)
[name], [name]                ACCOUNT NO.:  [id number]  MEDICAL RECORD NO.:  [id number]  LOCATION:                                 FACILITY:  PHYSICIAN:  Paula Compton. Willey Blade, MD       DATE OF BIRTH:  [birth year]  DATE OF PROCEDURE:  08/03/2013 DATE OF DISCHARGE:                                PROGRESS NOTE   SUBJECTIVE:  [name] was admitted yesterday by Dr. Anastasio Champion for a right lower lobe pneumonia and a possible UTI.  Her CT of the chest revealed increased airspace disease in the right lower lobe.  Previous consolidation of the lingula was improved compared to a CT last January. She has had cough and pleuritic right chest pain.  Her white count yesterday was 11.7.  She is feeling better today.  She is having difficulty mobilizing any secretions.  OBJECTIVE:  VITAL SIGNS:  Temperature 98.3, pulse 90 and paced on telemetry, blood pressure 89/74, respirations 18, oxygen saturation 94%. LUNGS:  Bilateral rhonchi. HEART:  Regular with no murmurs. ABDOMEN:  Soft and nontender. EXTREMITIES:  No edema.  IMPRESSION/PLAN: 1. Right lower lobe pneumonia.  Continue IV antibiotic therapy with     ceftriaxone, azithromycin and levofloxacin.  White count is 10.4. 2. Troponin elevations with history of muscular dystrophy.  Troponin     is 0.61.  She has not had symptoms consistent with an myocardial     infarction. 3. Normocytic anemia.  Hemoglobin is 10.3 with an MCV of 88. 4. Possible urinary tract infection.  Culture pending.  Urinalysis     reveals 7-10 white cells with many bacteria, but also many squamous     epithelial cells.  She denies dysuria.     Paula Compton. Willey Blade, MD     ROF/MEDQ  D:  08/03/2013  T:  08/03/2013  Job:  [id number]

## 2013-08-05 NOTE — Progress Notes (Signed)
[name], [name]                ACCOUNT NO.:  [id number]  MEDICAL RECORD NO.:  [id number]  LOCATION:  A308                          FACILITY:  [facility]  PHYSICIAN:  Paula Compton. Willey Blade, MD       DATE OF BIRTH:  1945/06/10  DATE OF PROCEDURE:  08/04/2013 DATE OF DISCHARGE:                                PROGRESS NOTE   SUBJECTIVE:  [name] is feeling better.  She is having less right- sided pleuritic chest pain.  She coughed up more sputum last night she states.  OBJECTIVE:  VITAL SIGNS:  Temperature 98.8, pulse 73, blood pressure 91/58. LUNGS:  Bilateral rhonchi. HEART:  Regular with no murmurs. ABDOMEN:  Soft and nontender. EXTREMITIES:  No edema.  IMPRESSION/PLAN: 1. Right lower lobe pneumonia.  White count. 10.4 yesterday.  No     fever.  Continue IV antibiotic therapy with Rocephin and Zithromax. 2. Urinary tract infection.  Culture reveals greater than 100,000 gram-     negative rods.  Continue antibiotic therapy. 3. Pulmonary nodules. 4. Myotonic dystrophy. 5. Troponin elevation. 6. Diabetes.  Glucoses yesterday ranged from 122-204, glucose this     morning is 143.     Paula Compton. Willey Blade, MD     ROF/MEDQ  D:  08/04/2013  T:  08/05/2013  Job:  [id number]

## 2013-08-05 NOTE — Care Management Utilization Note (Signed)
UR completed 

## 2013-08-05 NOTE — Progress Notes (Signed)
Late Entry: Dr. Ezzie Dural was notified of the patient coughing up approx. 7 ml of bright red blood. She was asleep and the sputum was on the table in her room.  I woke her up and discussed the situation with the patient she had no further concerns or complaints voiced at this time.   I also notified him that the patient was impacted and I disimpacted her.  MD gave new orders.

## 2013-08-05 NOTE — Care Management Note (Unsigned)
    Page 1 of 1   08/05/2013     2:49:41 PM CARE MANAGEMENT NOTE 08/05/2013  Patient:  [name], [name]   Account Number:  [id number]  Date Initiated:  08/05/2013  Documentation initiated by:  Vladimir Creeks  Subjective/Objective Assessment:   Admitted with PNA and UTI. Pt is from home, lives with children. She will be returning home at D/C     Action/Plan:   No needs identified   Anticipated DC Date:  08/06/2013   Anticipated DC Plan:  [place]  CM consult      Choice offered to / List presented to:             Status of service:  In process, will continue to follow Medicare Important Message given?   (If response is "NO", the following Medicare IM given date fields will be blank) Date Medicare IM given:   Date Additional Medicare IM given:    Discharge Disposition:    Per UR Regulation:  Reviewed for med. necessity/level of care/duration of stay  If discussed at [facility] [place] of Stay Meetings, dates discussed:    Comments:  08/05/13 [place] RN/CM

## 2013-08-05 NOTE — Progress Notes (Signed)
[name] VHQ:[id number] DOB: 07-13-1945 DOA: 08/02/2013 PCP: Robert Bellow, MD   Subjective: This lady is feeling somewhat better but feels weak today. She is coughing up productive sputum now. She also has Escherichia coli UTI, sensitive to Rocephin that she is already on. She has not desaturated.           Physical Exam: Blood pressure 120/71, pulse 68, temperature 98.6 F (37 C), temperature source Oral, resp. rate 20, height 5' 1.5" (1.562 m), weight 68.3 kg (150 lb 9.2 oz), SpO2 91.00%. She looks systemically well. She has crackles in the right mid and lower zones. There is no bronchial breathing. There is no increased work of breathing. There is no peripheral or central cyanosis. Heart sounds are present without murmurs. There is no evidence of heart failure clinically. She is alert and oriented.   Investigations:  Recent Results (from the past 240 hour(s))  URINE CULTURE     Status: None   Collection Time    08/02/13  1:25 PM      Result Value Ref Range Status   Specimen Description URINE, CLEAN CATCH   Final   Special Requests NONE   Final   Culture  Setup Time     Final   Value: 08/02/2013 18:47     Performed at [facility]     Final   Value: >=100,000 COLONIES/ML     Performed at [facility]   Culture     Final   Value: ESCHERICHIA COLI     Performed at [facility]   Report Status 08/05/2013 FINAL   Final   Organism ID, Bacteria ESCHERICHIA COLI   Final  MRSA PCR SCREENING     Status: None   Collection Time    08/02/13  7:52 PM      Result Value Ref Range Status   MRSA by PCR NEGATIVE  NEGATIVE Final   Comment:            The GeneXpert MRSA Assay (FDA     approved for NASAL specimens     only), is one component of a     comprehensive MRSA colonization     surveillance program. It is not     intended to diagnose MRSA     infection nor to guide or     monitor treatment for     MRSA infections.     Basic Metabolic Panel:  Recent Labs  08/02/13 1240 08/02/13 1641 08/03/13 0427  NA 141  --  143  K 4.1  --  4.0  CL 105  --  105  CO2 26  --  28  GLUCOSE 148*  --  130*  BUN 18  --  16  CREATININE 1.12* 1.00 1.18*  CALCIUM 10.3  --  9.6   Liver Function Tests:  Recent Labs  08/02/13 1240 08/03/13 0427  AST 15 15  ALT 8 7  ALKPHOS 75 78  BILITOT 0.4 0.3  PROT 7.0 6.5  ALBUMIN 3.5 3.1*     CBC:  Recent Labs  08/02/13 1240 08/02/13 1641 08/03/13 0427  WBC 11.7* 9.5 10.4  NEUTROABS 8.1*  --   --   HGB 11.7* 10.8* 10.3*  HCT 37.8 35.3* 33.6*  MCV 87.3 87.6 88.2  PLT 169 139* 152    No results found.    Medications: I have reviewed the patient's current medications.  Impression: 1. Right lower lobe pneumonia, clinically improving. 2. Escherichia  coli UTI, sensitive to Rocephin. 3. 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 No clinical evidence of acute coronary syndrome. 4. Type 2 diabetes mellitus.     Plan: 1. Continue with intravenous antibiotics today. 2. Discontinue IV fluids. 3. Mobilize. 4. Physical therapy evaluation to see what kind of home health services she might need.  Consultants:  None.   Procedures:  None.   Antibiotics:  Intravenous ceftriaxone and intravenous azithromycin.                   Code Status: Full code.  Family Communication: I discussed the plan with patient at the bedside.   Disposition Plan: Home when medically stable.  Time spent: 15 minutes.   LOS: 3 days   Shamiah Kahler C Elizabethann Lackey   08/05/2013, 8:36 AM

## 2013-08-06 LAB — BASIC METABOLIC PANEL
BUN: 13 mg/dL (ref 6–23)
CHLORIDE: 109 meq/L (ref 96–112)
CO2: 27 meq/L (ref 19–32)
CREATININE: 1.01 mg/dL (ref 0.50–1.10)
Calcium: 9.7 mg/dL (ref 8.4–10.5)
GFR calc Af Amer: 65 mL/min — ABNORMAL LOW (ref >90)
GFR calc non Af Amer: 56 mL/min — ABNORMAL LOW (ref >90)
GLUCOSE: 131 mg/dL — AB (ref 70–99)
Potassium: 3.5 mEq/L — ABNORMAL LOW (ref 3.7–5.3)
Sodium: 147 mEq/L (ref 137–147)

## 2013-08-06 LAB — GLUCOSE, CAPILLARY: GLUCOSE-CAPILLARY: 129 mg/dL — AB (ref 70–99)

## 2013-08-06 LAB — CBC
HEMATOCRIT: 31.2 % — AB (ref 36.0–46.0)
HEMOGLOBIN: 9.5 g/dL — AB (ref 12.0–15.0)
MCH: 27.1 pg (ref 26.0–34.0)
MCHC: 30.4 g/dL (ref 30.0–36.0)
MCV: 88.9 fL (ref 78.0–100.0)
PLATELETS: 190 10*3/uL (ref 150–400)
RBC: 3.51 MIL/uL — AB (ref 3.87–5.11)
RDW: 15.7 % — ABNORMAL HIGH (ref 11.5–15.5)
WBC: 9.5 10*3/uL (ref 4.0–10.5)

## 2013-08-06 MED ORDER — KETOCONAZOLE 2 % EX CREA: TOPICAL_CREAM | Freq: Two times a day (BID) | CUTANEOUS | Status: DC

## 2013-08-06 MED ORDER — LOPERAMIDE HCL 2 MG PO CAPS
2.0000 mg | ORAL_CAPSULE | Freq: Once | ORAL | Status: AC
  Administered 2013-08-06: 2 mg via ORAL
  Filled 2013-08-06: qty 1

## 2013-08-06 MED ORDER — LEVOFLOXACIN 500 MG PO TABS: 500.0000 mg | ORAL_TABLET | Freq: Every day | ORAL | Status: AC

## 2013-08-06 NOTE — Progress Notes (Signed)
Patient had some nausea,and vomiting this am with a small amount bright red blood noted to emesis,Dr [place] notified.No orders received at this time ,patient is to follow up with PCP,continued with discharge.

## 2013-08-06 NOTE — Progress Notes (Signed)
PT Cancellation Note  Patient Details Name: [name] MRN: [id number] DOB: Sep 01, 1945   Cancelled Treatment:    Reason Eval/Treat Not Completed: Medical issues which prohibited therapy. Pt pending d/c home today. Pt reported she is very weak and is not able to walk. Pt stated she uses a w/c to get around at home. Pt is not interested in having home PT to address weakness and decreased mobility. I was unable to complete a PT eval due to pt began vomiting. Pt reports she has assistance from her family to help with her mobility. I would recommend HHPT if plan is for d/c home, but pt is not agreeable at this time. Will attempt evaluation at another time if d/c is delayed and pt is agreeable.   Benicio Manna Kerstine Ernestine Conrad 08/06/2013, 9:08 AM

## 2013-08-06 NOTE — Progress Notes (Signed)
Patient discharged home with instructions given on medications,and follow up visits,patient verbalized understanding.Prescriptions sent with patient. No c/o pain or discomfort noted. Accompanied by staff to an awaiting vehicle. 

## 2013-08-06 NOTE — Discharge Summary (Signed)
Physician Discharge Summary  Patient ID: [name] MRN: [id number] DOB/AGE: 68-08-1945 68 y.o. Primary Care Physician:KNOWLTON,STEPHEN D, MD Admit date: 08/02/2013 Discharge date: 08/06/2013    Discharge Diagnoses:  1. Right lower lobe pneumonia, clinically improving. 2. Escherichia coli UTI. 3. 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 No clinical evidence of acute coronary syndrome. 4. Type 2 diabetes mellitus. 5. Obesity. 6. COPD, stable.     Medication List         albuterol (2.5 MG/3ML) 0.083% nebulizer solution  Commonly known as:  PROVENTIL  Take 2.5 mg by nebulization every 6 (six) hours as needed for shortness of breath.     aspirin EC 81 MG tablet  Take 81 mg by mouth 2 (two) times daily.     furosemide 40 MG tablet  Commonly known as:  LASIX  Take 40 mg by mouth.     guaiFENesin 600 MG 12 hr tablet  Commonly known as:  MUCINEX  Take 1 tablet (600 mg total) by mouth 2 (two) times daily.     ketoconazole 2 % cream  Commonly known as:  NIZORAL  Apply topically 2 (two) times daily.     levofloxacin 500 MG tablet  Commonly known as:  LEVAQUIN  Take 1 tablet (500 mg total) by mouth daily.     oxyCODONE-acetaminophen 10-325 MG per tablet  Commonly known as:  PERCOCET  Take 1 tablet by mouth every 6 (six) hours as needed for pain.     pantoprazole 40 MG tablet  Commonly known as:  PROTONIX  Take 40 mg by mouth every morning.     simvastatin 40 MG tablet  Commonly known as:  ZOCOR  Take 40 mg by mouth every evening.        Discharged Condition: Stable and improving.    Consults: None.  Significant Diagnostic Studies: Ct Abdomen Pelvis Wo Contrast  08/02/2013   CLINICAL DATA:  Back pain.  Right flank pain.  EXAM: CT CHEST, ABDOMEN AND PELVIS WITHOUT CONTRAST  TECHNIQUE: Multidetector CT imaging of the chest, abdomen and pelvis was performed following the standard protocol without IV contrast.  COMPARISON:  04/08/2013.   05/27/2012.  FINDINGS: CT CHEST FINDINGS  Pacemaker device the of the left subclavian vein and leads are noted.  No evidence of mediastinal hemorrhage. No abnormal adenopathy. Calcifications in the left thyroid gland are partially imaged.  Minimal aortic valve calcifications. Mild 3 vessel coronary artery calcifications.  No evidence of intramural hematoma in the ascending or descending aorta.  No pneumothorax or pleural effusion.  5 mm right lower lobe pulmonary nodule on image 32 is stable. New 5 mm pulmonary nodule in the right lower lobe on image 33 is noted. Minimal dependent atelectasis versus airspace disease in the right lower lobe has increased. Consolidation in the lingula has improved.  No acute bony deformity.  CT ABDOMEN AND PELVIS FINDINGS  No hydronephrosis. No ureteral calculus. Right renal exophytic hypodensity is stable.  Pancreas is atrophic.  Adrenal glands are unremarkable  Postcholecystectomy.  No free-fluid.  No abnormal adenopathy.  Degenerative changes in the lumbar spine.  IMPRESSION: Left thyroid calcifications are stable.  Bibasilar atelectasis versus airspace disease as described. It is improved in the lingula but worse at the right base.  Stable 5 mm right lower lobe pulmonary nodule. New 5 mm right lower lobe pulmonary nodule. If the patient is at high risk for bronchogenic carcinoma, follow-up chest CT at 6-12 months is recommended. If the patient is at low  risk for bronchogenic carcinoma, follow-up chest CT at 12 months is recommended. This recommendation follows the consensus statement: Guidelines for Management of Small Pulmonary Nodules Detected on CT Scans: A Statement from the [employer] as published in Radiology 2005;237:395-400.  No evidence of urinary calculus or urinary obstruction in the abdomen or pelvis.   Electronically Signed   By: Maryclare Bean M.D.   On: 08/02/2013 14:35   Ct Chest Wo Contrast  08/02/2013   CLINICAL DATA:  Back pain.  Right flank pain.  EXAM: CT  CHEST, ABDOMEN AND PELVIS WITHOUT CONTRAST  TECHNIQUE: Multidetector CT imaging of the chest, abdomen and pelvis was performed following the standard protocol without IV contrast.  COMPARISON:  04/08/2013.  05/27/2012.  FINDINGS: CT CHEST FINDINGS  Pacemaker device the of the left subclavian vein and leads are noted.  No evidence of mediastinal hemorrhage. No abnormal adenopathy. Calcifications in the left thyroid gland are partially imaged.  Minimal aortic valve calcifications. Mild 3 vessel coronary artery calcifications.  No evidence of intramural hematoma in the ascending or descending aorta.  No pneumothorax or pleural effusion.  5 mm right lower lobe pulmonary nodule on image 32 is stable. New 5 mm pulmonary nodule in the right lower lobe on image 33 is noted. Minimal dependent atelectasis versus airspace disease in the right lower lobe has increased. Consolidation in the lingula has improved.  No acute bony deformity.  CT ABDOMEN AND PELVIS FINDINGS  No hydronephrosis. No ureteral calculus. Right renal exophytic hypodensity is stable.  Pancreas is atrophic.  Adrenal glands are unremarkable  Postcholecystectomy.  No free-fluid.  No abnormal adenopathy.  Degenerative changes in the lumbar spine.  IMPRESSION: Left thyroid calcifications are stable.  Bibasilar atelectasis versus airspace disease as described. It is improved in the lingula but worse at the right base.  Stable 5 mm right lower lobe pulmonary nodule. New 5 mm right lower lobe pulmonary nodule. If the patient is at high risk for bronchogenic carcinoma, follow-up chest CT at 6-12 months is recommended. If the patient is at low risk for bronchogenic carcinoma, follow-up chest CT at 12 months is recommended. This recommendation follows the consensus statement: Guidelines for Management of Small Pulmonary Nodules Detected on CT Scans: A Statement from the [employer] as published in Radiology 2005;237:395-400.  No evidence of urinary calculus or  urinary obstruction in the abdomen or pelvis.   Electronically Signed   By: Maryclare Bean M.D.   On: 08/02/2013 14:35   Dg Chest Portable 1 View  08/02/2013   CLINICAL DATA:  Back pain right chest pain recent with pneumonia  EXAM: PORTABLE CHEST - 1 VIEW  COMPARISON:  DG CHEST 1V PORT dated 04/11/2013; CT CHEST W/O CM dated 04/08/2013; DG CHEST 2 VIEW dated 04/08/2013  FINDINGS: Mild cardiac enlargement stable. Cardiac pacer in unchanged position. Allowing for rotation, no infiltrate or consolidation. No significant pleural effusion.  IMPRESSION: No acute abnormalities   Electronically Signed   By: Skipper Cliche M.D.   On: 08/02/2013 13:22    Lab Results: Basic Metabolic Panel:  Recent Labs  08/06/13 0502  NA 147  K 3.5*  CL 109  CO2 27  GLUCOSE 131*  BUN 13  CREATININE 1.01  CALCIUM 9.7       CBC:  Recent Labs  08/06/13 0502  WBC 9.5  HGB 9.5*  HCT 31.2*  MCV 88.9  PLT 190    Recent Results (from the past 240 hour(s))  URINE CULTURE  Status: None   Collection Time    08/02/13  1:25 PM      Result Value Ref Range Status   Specimen Description URINE, CLEAN CATCH   Final   Special Requests NONE   Final   Culture  Setup Time     Final   Value: 08/02/2013 18:47     Performed at [facility]     Final   Value: >=100,000 COLONIES/ML     Performed at [facility]   Culture     Final   Value: ESCHERICHIA COLI     Performed at [facility]   Report Status 08/05/2013 FINAL   Final   Organism ID, Bacteria ESCHERICHIA COLI   Final  MRSA PCR SCREENING     Status: None   Collection Time    08/02/13  7:52 PM      Result Value Ref Range Status   MRSA by PCR NEGATIVE  NEGATIVE Final   Comment:            The GeneXpert MRSA Assay (FDA     approved for NASAL specimens     only), is one component of a     comprehensive MRSA colonization     surveillance program. It is not     intended to diagnose MRSA     infection nor to guide or      monitor treatment for     MRSA infections.     Hospital Course: This is a 68 year old lady who presents to the hospital with symptoms of right-sided pleuritic chest pain. Please see initial history as outlined below: HPI: [name] is a 68 y.o. female  This is a 68 year old lady who presents with a 24-hour history of right sided pleuritic chest and back pain. She has not had much of a cough nor fever. She does not have significant dyspnea. She did not have any urinary symptoms. Evaluation in the emergency room shows that she might have a possible pneumonia. Her troponin level is also elevated. Urinalysis is consistent with UTI. She is now being admitted for further management. The patient was treated with intravenous antibiotics. Urine culture showed also the presence of a UTI with Escherichia coli which was sensitive to ceftriaxone. Over the last couple of days her chest pain has resolved. She did have some constipation for which she had to have laxatives. Her oxygen requirements are stable. She continues to require oxygen but she would at home. She is now stable for discharge and will complete a course of oral antibiotics for another week of Levaquin. She will followup with her primary care physician in the office in a couple weeks. Discharge Exam: Blood pressure 113/68, pulse 78, temperature 99.5 F (37.5 C), temperature source Oral, resp. rate 20, height 5' 1.5" (1.562 m), weight 68.3 kg (150 lb 9.2 oz), SpO2 91.00%. She looks systemically well. She is nontoxic or septic. There is no increased work of breathing. Lung fields actually sound fairly clear with a few crackles at both bases more on the right than the left. There is no bronchial breathing or wheezing. Heart sounds are present without murmurs or added sounds. She is alert and oriented.  Disposition: Home.      Discharge Orders   Future Appointments Provider Department Dept Phone   08/13/2013 11:45 AM Sanda Klein, MD [facility] [phone]   Future Orders Complete By Expires   Diet - low sodium heart healthy  As directed    Increase activity slowly  As directed       Follow-up Information   Follow up with Robert Bellow, MD. Schedule an appointment as soon as possible for a visit in 2 weeks.   Specialty:  Family Medicine   Contact information:   [place] [address] [phone]       Signed: Doree Albee   08/06/2013, 8:30 AM

## 2013-08-13 ENCOUNTER — Encounter: Payer: Self-pay | Admitting: Cardiovascular Disease

## 2013-08-13 ENCOUNTER — Ambulatory Visit (INDEPENDENT_AMBULATORY_CARE_PROVIDER_SITE_OTHER): Payer: Medicare Other | Admitting: Cardiovascular Disease

## 2013-08-13 VITALS — BP 82/52 | HR 90 | Resp 20 | Ht 61.0 in | Wt 149.3 lb

## 2013-08-13 DIAGNOSIS — Z950 Presence of cardiac pacemaker: Secondary | ICD-10-CM

## 2013-08-13 DIAGNOSIS — I480 Paroxysmal atrial fibrillation: Secondary | ICD-10-CM

## 2013-08-13 DIAGNOSIS — I495 Sick sinus syndrome: Secondary | ICD-10-CM

## 2013-08-13 DIAGNOSIS — I4891 Unspecified atrial fibrillation: Secondary | ICD-10-CM

## 2013-08-13 DIAGNOSIS — R748 Abnormal levels of other serum enzymes: Secondary | ICD-10-CM

## 2013-08-13 LAB — PACEMAKER DEVICE OBSERVATION

## 2013-08-13 NOTE — Patient Instructions (Addendum)
START Eliquis 5mg  twice a day.  Remote monitoring is used to monitor your pacemaker from home. This monitoring reduces the number of office visits required to check your device to one time per year. It allows [place] to keep an eye on the functioning of your device to ensure it is working properly. You are scheduled for a device check from home on 09-12-2013. You may send your transmission at any time that day. If you have a wireless device, the transmission will be sent automatically. After your physician reviews your transmission, you will receive a postcard with your next transmission date.  Your physician recommends that you schedule a follow-up appointment in: One month with Erasmo Downer, PharmD.  Dr. Sallyanne Kuster recommends that you schedule a follow-up appointment in: 3 months.

## 2013-08-14 ENCOUNTER — Telehealth: Payer: Self-pay | Admitting: Cardiovascular Disease

## 2013-08-14 NOTE — Telephone Encounter (Signed)
Pt was her yesterday and was told her Eliqius would be called in. It is not there,please call to [facility]-[phone].

## 2013-08-15 MED ORDER — APIXABAN 5 MG PO TABS: 5.0000 mg | ORAL_TABLET | Freq: Two times a day (BID) | ORAL | Status: DC

## 2013-08-15 NOTE — Addendum Note (Signed)
Addended by: Fidel Levy on: 08/15/2013 10:45 AM   Modules accepted: Orders

## 2013-08-15 NOTE — Telephone Encounter (Signed)
Rx was sent to pharmacy electronically. 

## 2013-08-17 LAB — MDC_IDC_ENUM_SESS_TYPE_INCLINIC
Battery Remaining Longevity: 5 mo
Battery Voltage: 2.64 V
Brady Statistic AP VP Percent: 34.2 %
Brady Statistic AS VP Percent: 0.2 %
Brady Statistic AS VS Percent: 1.6 %
Lead Channel Impedance Value: 336 Ohm
Lead Channel Impedance Value: 817 Ohm
Lead Channel Pacing Threshold Amplitude: 0.5 V
Lead Channel Sensing Intrinsic Amplitude: 11.2 mV
Lead Channel Setting Pacing Amplitude: 2.5 V
Lead Channel Setting Pacing Pulse Width: 0.4 ms
Lead Channel Setting Sensing Sensitivity: 4 mV
MDC IDC MSMT BATTERY IMPEDANCE: 4721 Ohm
MDC IDC MSMT LEADCHNL RA PACING THRESHOLD AMPLITUDE: 1 V
MDC IDC MSMT LEADCHNL RA PACING THRESHOLD PULSEWIDTH: 0.4 ms
MDC IDC MSMT LEADCHNL RA SENSING INTR AMPL: 1.4 mV
MDC IDC MSMT LEADCHNL RV PACING THRESHOLD PULSEWIDTH: 0.4 ms
MDC IDC SET LEADCHNL RV PACING AMPLITUDE: 2 V
MDC IDC STAT BRADY AP VS PERCENT: 64 %

## 2013-08-18 ENCOUNTER — Encounter: Payer: Self-pay | Admitting: Cardiovascular Disease

## 2013-08-18 DIAGNOSIS — I480 Paroxysmal atrial fibrillation: Secondary | ICD-10-CM | POA: Insufficient documentation

## 2013-08-18 DIAGNOSIS — R748 Abnormal levels of other serum enzymes: Secondary | ICD-10-CM | POA: Insufficient documentation

## 2013-08-18 DIAGNOSIS — Z950 Presence of cardiac pacemaker: Secondary | ICD-10-CM | POA: Insufficient documentation

## 2013-08-18 MED ORDER — APIXABAN 5 MG PO TABS: 5.0000 mg | ORAL_TABLET | Freq: Two times a day (BID) | ORAL | Status: DC

## 2013-08-18 NOTE — Assessment & Plan Note (Signed)
She had elevated cardiac troponin I levels in May  (minimally so) and elevated pro BNP in January. While these biochemical abnormalities did not necessarily mean that she had heart failure or an acute myocardial infarction respectively, they do portend a poor prognosis from a cardiovascular standpoint. There is a low threshold for further imaging evaluation of her coronary tree, she should develop any symptoms.

## 2013-08-18 NOTE — Progress Notes (Signed)
Patient ID: [name], female   DOB: May 12, 1945, 68 y.o.   MRN: [id number]      Reason for office visit Paroxysmal atrial fibrillation, sinus node dysfunction status post pacemaker, PAD,  [name] returns for routine followup but has had several health problems this year. She was hospitalized in January with a pneumonia and again hospitalized in early May with a pneumonia and urinary tract infection.   During the hospitalization in January there was an episode of atrial fibrillation recorded by her pacemaker, it lasted for about 11 hours and was associated with rapid ventricular rate. It is not clear to me whether this was noted on telemetry. Her gait enzymes were normal. The proBNP rose from 1980 on admission to 5745 by the time of discharge.  Her troponin I level was persistently elevated at around 0.6-0.9 during the May 2015 hospitalization. She did not have anginal chest pain or ECG changes. She quit smoking again November 2014. Has type 2 diabetes mellitus that does not require medications. She has treated hyperlipidemia on statin. Had a normal nuclear stress test in 2004 and has normal left ventricular systolic function by echo in January 2015. 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 She received a dual-chamber Medtronic permanent pacemaker in December 2006 for symptomatic sinus bradycardia.  Bears a diagnosis of muscular dystrophy.  She complains of occasional dyspnea and ankle swelling but denies dizziness or angina. She does not have intermittent claudication but she has been very inactive.  Allergies  Allergen Reactions  . Prednisone Other (See Comments)    Chest pain    Current Outpatient Prescriptions  Medication Sig Dispense  Refill  . albuterol (PROVENTIL) (2.5 MG/3ML) 0.083% nebulizer solution Take 2.5 mg by nebulization every 6 (six) hours as needed for shortness of breath.       Marland Kitchen aspirin EC 81 MG tablet Take 81 mg by mouth 2 (two) times daily.       . furosemide (LASIX) 40 MG tablet Take 40 mg by mouth.      Marland Kitchen guaiFENesin (MUCINEX) 600 MG 12 hr tablet Take 1 tablet (600 mg total) by mouth 2 (two) times daily.  30 tablet  0  . ketoconazole (NIZORAL) 2 % cream Apply topically 2 (two) times daily.  15 g  0  . oxyCODONE-acetaminophen (PERCOCET) 10-325 MG per tablet Take 1 tablet by mouth every 6 (six) hours as needed for pain.       . pantoprazole (PROTONIX) 40 MG tablet Take 40 mg by mouth every morning.       . simvastatin (ZOCOR) 40 MG tablet Take 40 mg by mouth every evening.        Marland Kitchen apixaban (ELIQUIS) 5 MG TABS tablet Take 1 tablet (5 mg total) by mouth 2 (two) times daily.  60 tablet  5  . apixaban (ELIQUIS) 5 MG TABS tablet Take 1 tablet (5 mg total) by mouth 2 (two) times daily.  60 tablet  6   No current facility-administered medications for this visit.    Past Medical History  Diagnosis Date  . Sleep apnea     uses cpap  . Peripheral vascular disease   . GERD (gastroesophageal reflux disease)   . Neuromuscular disorder     HX of MD  . Diabetes   .  Sinus node dysfunction   . H/O cardiac pacemaker   . Tobacco abuse   . Hyperlipidemia   . SSS (sick sinus syndrome)   . DDD (degenerative disc disease), lumbar     Past Surgical History  Procedure Laterality Date  . Insert / replace / remove pacemaker    . Cholecystectomy    . Abdominal hysterectomy    . Tonsillectomy    . Tubal ligation    . Femoral artery stent  02/18/2011  . Esophagogastroduodenoscopy  04/13/2002    A short segment of salmon-colored epithelium in the distal esophagus  consistent with Barrett's esophagus, biopsied/ The remainder of the esophageal mucosa, stomach, and duodenum through the second portion appeared normal  .  Colonoscopy  04/13/2002    Diminutive polyps in the rectum cold biopsied/removed/ The remainder of the rectal mucosa and colon appeared normal.    No family history on file.  History   Social History  . Marital Status: Married    Spouse Name: N/A    Number of Children: N/A  . Years of Education: N/A   Occupational History  . Not on file.   Social History Main Topics  . Smoking status: Former Smoker -- 0.50 packs/day for 50 years    Types: Cigarettes    Quit date: 02/02/2013  . Smokeless tobacco: Never Used  . Alcohol Use: No  . Drug Use: No  . Sexual Activity: Not on file   Other Topics Concern  . Not on file   Social History Narrative  . No narrative on file    Review of systems: Complains of fatigue and some shortness of breath with activity but is not particularly prominent.  It sounds that are intermittent claudication is actually the major limiting factor. She has more discomfort in her left leg. She denies angina, edema, syncope, palpitations or dizziness.  PHYSICAL EXAM BP 82/52  Pulse 90  Resp 20  Ht 5\' 1"  (1.549 m)  Wt 149 lb 4.8 oz (67.722 kg)  BMI 28.22 kg/m2  General: Alert, oriented x3, no distress Head: no evidence of trauma, PERRL, EOMI, no exophtalmos or lid lag, no myxedema, no xanthelasma; normal ears, nose and oropharynx Neck: normal jugular venous pulsations and no hepatojugular reflux; brisk carotid pulses without delay and no carotid bruits Chest: clear to auscultation, no signs of consolidation by percussion or palpation, normal fremitus, symmetrical and full respiratory excursions Cardiovascular: normal position and quality of the apical impulse, regular rhythm, normal first and second heart sounds, no murmurs, rubs or gallops Abdomen: no tenderness or distention, no masses by palpation, no abnormal pulsatility or arterial bruits, normal bowel sounds, no hepatosplenomegaly Extremities: no clubbing, cyanosis or edema; 2+ radial, ulnar and  brachial pulses bilaterally; 2+ right femoral, posterior tibial and dorsalis pedis pulses; 2+ left femoral, absent posterior tibial and dorsalis pedis pulses; no subclavian or femoral bruits Neurological: grossly nonfocal   EKG: Atrial paced ventricular sensed  Lipid Panel  No results found for this basename: chol, trig, hdl, cholhdl, vldl, ldlcalc    BMET    Component Value Date/Time   NA 147 08/06/2013 0502   K 3.5* 08/06/2013 0502   CL 109 08/06/2013 0502   CO2 27 08/06/2013 0502   GLUCOSE 131* 08/06/2013 0502   BUN 13 08/06/2013 0502   CREATININE 1.01 08/06/2013 0502   CREATININE 0.96 10/23/2012 1215   CALCIUM 9.7 08/06/2013 0502   GFRNONAA 56* 08/06/2013 0502   GFRAA 65* 08/06/2013 0502     ASSESSMENT AND  PLAN Paroxysmal atrial fibrillation Her current episode of atrial fibrillation occurred during pneumonia, but she has had previous atrial fibrillation and is at high risk of cardioembolic events because of her age, gender, vascular disease and presence of diabetes mellitus (albeit treated with diet). I recommended that she start an anticoagulant. After we discussed the pros and cons of warfarin versus novel agents she prefers the latter. We'll start treatment with Eliquis.  Pacemaker Her pacemaker generator is expected to reach elective replacement indicator in the next 5 months. She paces the atrium 90% of the time but is not truly pacemaker dependent. She also has roughly 34% ventricular pacing. We'll perform another modality in 3 months. After that we should probably check it monthly.  Abnormal cardiac enzyme level She had elevated cardiac troponin I levels in May  (minimally so) and elevated pro BNP in January. While these biochemical abnormalities did not necessarily mean that she had heart failure or an acute myocardial infarction respectively, they do portend a poor prognosis from a cardiovascular standpoint. There is a low threshold for further imaging evaluation of her coronary tree, she  should develop any symptoms.   Patient Instructions  START Eliquis 5mg  twice a day.  Remote monitoring is used to monitor your pacemaker from home. This monitoring reduces the number of office visits required to check your device to one time per year. It allows [place] to keep an eye on the functioning of your device to ensure it is working properly. You are scheduled for a device check from home on 09-12-2013. You may send your transmission at any time that day. If you have a wireless device, the transmission will be sent automatically. After your physician reviews your transmission, you will receive a postcard with your next transmission date.  Your physician recommends that you schedule a follow-up appointment in: One month with Erasmo Downer, PharmD.  Dr. Sallyanne Kuster recommends that you schedule a follow-up appointment in: 3 months.         Orders Placed This Encounter  Procedures  . Implantable device check   Meds ordered this encounter  Medications  . apixaban (ELIQUIS) 5 MG TABS tablet    Sig: Take 1 tablet (5 mg total) by mouth 2 (two) times daily.    Dispense:  60 tablet    Refill:  5    Levie Owensby  Sanda Klein, MD, [facility] [phone] office [phone] pager

## 2013-08-18 NOTE — Assessment & Plan Note (Signed)
Her current episode of atrial fibrillation occurred during pneumonia, but she has had previous atrial fibrillation and is at high risk of cardioembolic events because of her age, gender, vascular disease and presence of diabetes mellitus (albeit treated with diet). I recommended that she start an anticoagulant. After we discussed the pros and cons of warfarin versus novel agents she prefers the latter. We'll start treatment with Eliquis.

## 2013-08-18 NOTE — Assessment & Plan Note (Signed)
Her pacemaker generator is expected to reach elective replacement indicator in the next 5 months. She paces the atrium 90% of the time but is not truly pacemaker dependent. She also has roughly 34% ventricular pacing. We'll perform another modality in 3 months. After that we should probably check it monthly.

## 2013-08-19 ENCOUNTER — Telehealth: Payer: Self-pay | Admitting: *Deleted

## 2013-08-19 DIAGNOSIS — E782 Mixed hyperlipidemia: Secondary | ICD-10-CM

## 2013-08-19 DIAGNOSIS — Z79899 Other long term (current) drug therapy: Secondary | ICD-10-CM

## 2013-08-19 NOTE — Telephone Encounter (Signed)
Message copied by Tressa Busman on Mon Aug 19, 2013  8:33 AM ------      Message from: [place], [place]      Created: Sun Aug 18, 2013  1:59 PM       Lipid and CMET in 3 months please ------

## 2013-08-19 NOTE — Telephone Encounter (Signed)
Order for Lipid and CMET placed and mailed to patient to have done in August.

## 2013-08-20 ENCOUNTER — Encounter: Payer: Self-pay | Admitting: Cardiovascular Disease

## 2013-09-12 ENCOUNTER — Encounter: Payer: Medicare Other | Admitting: *Deleted

## 2013-09-12 ENCOUNTER — Telehealth: Payer: Self-pay | Admitting: Cardiology

## 2013-09-12 NOTE — Telephone Encounter (Signed)
Spoke with pt and reminded pt of remote transmission that is due today. Pt verbalized understanding.   

## 2013-09-13 ENCOUNTER — Encounter: Payer: Self-pay | Admitting: Cardiology

## 2013-09-16 ENCOUNTER — Ambulatory Visit (INDEPENDENT_AMBULATORY_CARE_PROVIDER_SITE_OTHER): Payer: Medicare Other | Admitting: Pharmacist Clinician (PhC)/ Clinical Pharmacy Specialist

## 2013-09-16 DIAGNOSIS — I4891 Unspecified atrial fibrillation: Secondary | ICD-10-CM

## 2013-09-18 NOTE — Progress Notes (Signed)
Pt was started on Eliquis for atrial fibrillaton on Aug 13, 2013.    Reviewed patients medication list.  Pt is not currently on any combined P-gp and strong CYP3A4 inhibitors/inducers (ketoconazole, traconazole, ritonavir, carbamazepine, phenytoin, rifampin, St. John's wort).  Reviewed labs.  SCr 1.01, Weight 149.3, CrCl- 57.  Dose appropriate based on CrCl.   Hgb and HCT are both low at 9.5 and 31.2.  This seems to be consistent through all of 2015.  A full discussion of the nature of anticoagulants has been carried out.  A benefit/risk analysis has been presented to the patient, so that they understand the justification for choosing anticoagulation with Eliquis at this time.  The need for compliance is stressed.  Pt is aware to take the medication twice daily.  Side effects of potential bleeding are discussed, including unusual colored urine or stools, coughing up blood or coffee ground emesis, nose bleeds or serious fall or head trauma.  Discussed signs and symptoms of stroke. The patient should avoid any OTC items containing aspirin or ibuprofen.  Avoid alcohol consumption.   Call if any signs of abnormal bleeding.  Discussed financial obligations and resolved any difficulty in obtaining medication.  Next lab test test in 6 months.

## 2013-09-23 ENCOUNTER — Ambulatory Visit (INDEPENDENT_AMBULATORY_CARE_PROVIDER_SITE_OTHER): Payer: Medicare Other | Admitting: *Deleted

## 2013-09-23 DIAGNOSIS — I480 Paroxysmal atrial fibrillation: Secondary | ICD-10-CM

## 2013-09-23 DIAGNOSIS — I4891 Unspecified atrial fibrillation: Secondary | ICD-10-CM

## 2013-09-23 NOTE — Progress Notes (Signed)
Remote Pacemaker transmission.

## 2013-09-23 NOTE — Addendum Note (Signed)
Addended by: Tiajuana Amass on: 09/23/2013 03:44 PM   Modules accepted: Level of Service

## 2013-10-15 ENCOUNTER — Encounter: Payer: Medicare Other | Admitting: Cardiovascular Disease

## 2013-10-17 ENCOUNTER — Ambulatory Visit (INDEPENDENT_AMBULATORY_CARE_PROVIDER_SITE_OTHER): Payer: Medicare Other | Admitting: Cardiovascular Disease

## 2013-10-17 ENCOUNTER — Encounter: Payer: Self-pay | Admitting: Cardiovascular Disease

## 2013-10-17 ENCOUNTER — Other Ambulatory Visit: Payer: Self-pay | Admitting: *Deleted

## 2013-10-17 VITALS — BP 88/48 | HR 65 | Ht 61.0 in | Wt 145.0 lb

## 2013-10-17 DIAGNOSIS — Z45018 Encounter for adjustment and management of other part of cardiac pacemaker: Secondary | ICD-10-CM

## 2013-10-17 DIAGNOSIS — Z950 Presence of cardiac pacemaker: Secondary | ICD-10-CM

## 2013-10-17 DIAGNOSIS — Z01818 Encounter for other preprocedural examination: Secondary | ICD-10-CM

## 2013-10-17 DIAGNOSIS — Z45010 Encounter for checking and testing of cardiac pacemaker pulse generator [battery]: Secondary | ICD-10-CM

## 2013-10-17 DIAGNOSIS — I4891 Unspecified atrial fibrillation: Secondary | ICD-10-CM

## 2013-10-17 DIAGNOSIS — I495 Sick sinus syndrome: Secondary | ICD-10-CM | POA: Insufficient documentation

## 2013-10-17 DIAGNOSIS — R5383 Other fatigue: Secondary | ICD-10-CM

## 2013-10-17 DIAGNOSIS — R5381 Other malaise: Secondary | ICD-10-CM

## 2013-10-17 DIAGNOSIS — D689 Coagulation defect, unspecified: Secondary | ICD-10-CM

## 2013-10-17 DIAGNOSIS — I480 Paroxysmal atrial fibrillation: Secondary | ICD-10-CM

## 2013-10-17 NOTE — Patient Instructions (Signed)
Hold Eliquis 48 hours prior to your procedure  Your physician recommends that you return for lab work within 7 days before your procedure  Dr.Croitoru has ordered for you to have you Pacemaker generator changed out. This will done at [facility]. Depending on the time of this surgery you may go home the same day, if not the next morning.

## 2013-10-17 NOTE — Progress Notes (Signed)
Patient ID: [name], female   DOB: 19-Mar-1946, 68 y.o.   MRN: [id number]      Reason for office visit Pacemaker at elective replacement indicator  [name] is here to discuss pacemaker generator change out. Her device (Medtronic, implanted 2006 sinus node dysfunction) reached ERI on June 5th. Prior to switching to VVI she paced the atrium virtually 100% of the time and paced the ventricle roughly 34% of the time. Despite converting to VVI mode she does not appear to be troubled by symptoms of pacemaker syndrome or chronotropic incompetence. Her device does not allow reprogramming to DDD mode.  She has a history of paroxysmal atrial fibrillation, none recorded recently (she had an 11 hour episode in January). She takes apixaban. 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 She had a normal left ventricular ejection fraction by echo in January of 2015 and a normal nuclear stress test in 2004, but has not had recent assessment for ischemic heart disease. 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 She continues to smoke.   Allergies  Allergen Reactions  . Prednisone Other (See Comments)    Chest pain    Current Outpatient Prescriptions  Medication Sig Dispense Refill  . albuterol (PROVENTIL) (2.5 MG/3ML) 0.083% nebulizer solution Take 2.5 mg by nebulization every 6 (six) hours as needed for shortness of breath.       Marland Kitchen apixaban (ELIQUIS) 5 MG TABS tablet Take 1 tablet (5 mg total) by mouth 2 (two) times daily.  60 tablet  5  . furosemide (LASIX) 40 MG tablet Take 40 mg by mouth.      Marland Kitchen guaiFENesin (MUCINEX) 600 MG 12 hr tablet Take 1 tablet (600 mg total) by mouth 2 (two) times daily.  30 tablet  0  .  ketoconazole (NIZORAL) 2 % cream Apply topically 2 (two) times daily.  15 g  0  . oxyCODONE-acetaminophen (PERCOCET) 10-325 MG per tablet Take 1 tablet by mouth every 6 (six) hours as needed for pain.       . pantoprazole (PROTONIX) 40 MG tablet Take 40 mg by mouth every morning.       . simvastatin (ZOCOR) 40 MG tablet Take 40 mg by mouth every evening.         No current facility-administered medications for this visit.    Past Medical History  Diagnosis Date  . Sleep apnea     uses cpap  . Peripheral vascular disease   . GERD (gastroesophageal reflux disease)   . Neuromuscular disorder     HX of MD  . Diabetes   . Sinus node dysfunction   . H/O cardiac pacemaker   . Tobacco abuse   . Hyperlipidemia   . SSS (sick sinus syndrome)   . DDD (degenerative disc disease), lumbar     Past Surgical History  Procedure Laterality Date  . Insert / replace / remove pacemaker    . Cholecystectomy    . Abdominal hysterectomy    . Tonsillectomy    . Tubal ligation    . Femoral artery stent  02/18/2011  . Esophagogastroduodenoscopy  04/13/2002    A short segment of salmon-colored epithelium in the distal esophagus  consistent with Barrett's esophagus, biopsied/ The remainder of the esophageal mucosa, stomach, and  duodenum through the second portion appeared normal  . Colonoscopy  04/13/2002    Diminutive polyps in the rectum cold biopsied/removed/ The remainder of the rectal mucosa and colon appeared normal.    No family history on file.  History   Social History  . Marital Status: Married    Spouse Name: N/A    Number of Children: N/A  . Years of Education: N/A   Occupational History  . Not on file.   Social History Main Topics  . Smoking status: Former Smoker -- 0.50 packs/day for 50 years    Types: Cigarettes    Quit date: 02/02/2013  . Smokeless tobacco: Never Used  . Alcohol Use: No  . Drug Use: No  . Sexual Activity: Not on file   Other Topics Concern  . Not on  file   Social History Narrative  . No narrative on file    Review of systems: Complains of fatigue and some shortness of breath with activity but is not particularly prominent. It sounds that are intermittent claudication is actually the major limiting factor. She has more discomfort in her left leg. She denies angina, edema, syncope, palpitations or dizziness.   PHYSICAL EXAM BP 88/48  Pulse 65  Ht 5\' 1"  (1.549 m)  Wt 145 lb (65.772 kg)  BMI 27.41 kg/m2 General: Alert, oriented x3, no distress  Head: no evidence of trauma, PERRL, EOMI, no exophtalmos or lid lag, no myxedema, no xanthelasma; normal ears, nose and oropharynx  Neck: normal jugular venous pulsations and no hepatojugular reflux; brisk carotid pulses without delay and no carotid bruits  Chest: clear to auscultation, no signs of consolidation by percussion or palpation, normal fremitus, symmetrical and full respiratory excursions  Cardiovascular: normal position and quality of the apical impulse, regular rhythm, normal first and second heart sounds, no murmurs, rubs or gallops  Abdomen: no tenderness or distention, no masses by palpation, no abnormal pulsatility or arterial bruits, normal bowel sounds, no hepatosplenomegaly  Extremities: no clubbing, cyanosis or edema; 2+ radial, ulnar and brachial pulses bilaterally; 2+ right femoral, posterior tibial and dorsalis pedis pulses; 2+ left femoral, absent posterior tibial and dorsalis pedis pulses; no subclavian or femoral bruits  Neurological: grossly nonfocal   BMET    Component Value Date/Time   NA 147 08/06/2013 0502   K 3.5* 08/06/2013 0502   CL 109 08/06/2013 0502   CO2 27 08/06/2013 0502   GLUCOSE 131* 08/06/2013 0502   BUN 13 08/06/2013 0502   CREATININE 1.01 08/06/2013 0502   CREATININE 0.96 10/23/2012 1215   CALCIUM 9.7 08/06/2013 0502   GFRNONAA 56* 08/06/2013 0502   GFRAA 65* 08/06/2013 0502     ASSESSMENT AND PLAN  [name] requires a dual chamber permanent pacemaker  for symptomatic sinus bradycardia. Her device has reached elective replacement indicator. She'll be scheduled for a generator change out. The risks and benefits of this procedure were discussed with the patient and her son in detail. She will discontinue the Eliquis 48 hours before the planned procedure. She has not had atrial fibrillation in months.  Orders Placed This Encounter  Procedures  . INR/PT  . PTT  . CBC  . Basic Metabolic Panel (BMET)  . TSH  . EKG 12-Lead  . PACEMAKER GENERATOR CHANGE   Dasean Brow  Sanda Klein, MD, [place] [phone] office [phone] pager

## 2013-10-17 NOTE — Telephone Encounter (Signed)
Opened by mistake.

## 2013-10-22 ENCOUNTER — Encounter (HOSPITAL_COMMUNITY): Payer: Self-pay | Admitting: Pharmacist

## 2013-10-23 ENCOUNTER — Other Ambulatory Visit: Payer: Self-pay | Admitting: *Deleted

## 2013-10-23 DIAGNOSIS — Z45010 Encounter for checking and testing of cardiac pacemaker pulse generator [battery]: Secondary | ICD-10-CM

## 2013-10-23 LAB — CBC
HCT: 33.2 % — ABNORMAL LOW (ref 36.0–46.0)
Hemoglobin: 10 g/dL — ABNORMAL LOW (ref 12.0–15.0)
MCH: 23.1 pg — ABNORMAL LOW (ref 26.0–34.0)
MCHC: 30.1 g/dL (ref 30.0–36.0)
MCV: 76.7 fL — ABNORMAL LOW (ref 78.0–100.0)
Platelets: 185 10*3/uL (ref 150–400)
RBC: 4.33 MIL/uL (ref 3.87–5.11)
RDW: 17.6 % — ABNORMAL HIGH (ref 11.5–15.5)
WBC: 6.4 10*3/uL (ref 4.0–10.5)

## 2013-10-23 LAB — BASIC METABOLIC PANEL
BUN: 15 mg/dL (ref 6–23)
CO2: 26 mEq/L (ref 19–32)
Calcium: 9.4 mg/dL (ref 8.4–10.5)
Chloride: 112 mEq/L (ref 96–112)
Creat: 1.13 mg/dL — ABNORMAL HIGH (ref 0.50–1.10)
GLUCOSE: 93 mg/dL (ref 70–99)
Potassium: 5.1 mEq/L (ref 3.5–5.3)
SODIUM: 144 meq/L (ref 135–145)

## 2013-10-23 LAB — APTT: APTT: 36 s (ref 24–37)

## 2013-10-23 LAB — PROTIME-INR
INR: 1.64 — AB (ref <1.50)
Prothrombin Time: 19.4 seconds — ABNORMAL HIGH (ref 11.6–15.2)

## 2013-10-23 LAB — TSH: TSH: 1.406 u[IU]/mL (ref 0.350–4.500)

## 2013-10-28 DIAGNOSIS — G4730 Sleep apnea, unspecified: Secondary | ICD-10-CM | POA: Diagnosis not present

## 2013-10-28 DIAGNOSIS — I495 Sick sinus syndrome: Secondary | ICD-10-CM | POA: Diagnosis not present

## 2013-10-28 DIAGNOSIS — Z7901 Long term (current) use of anticoagulants: Secondary | ICD-10-CM | POA: Diagnosis not present

## 2013-10-28 DIAGNOSIS — G7109 Other specified muscular dystrophies: Secondary | ICD-10-CM | POA: Diagnosis not present

## 2013-10-28 DIAGNOSIS — Z86010 Personal history of colonic polyps: Secondary | ICD-10-CM | POA: Diagnosis not present

## 2013-10-28 DIAGNOSIS — Z87891 Personal history of nicotine dependence: Secondary | ICD-10-CM | POA: Diagnosis not present

## 2013-10-28 DIAGNOSIS — M5137 Other intervertebral disc degeneration, lumbosacral region: Secondary | ICD-10-CM | POA: Diagnosis not present

## 2013-10-28 DIAGNOSIS — K219 Gastro-esophageal reflux disease without esophagitis: Secondary | ICD-10-CM | POA: Diagnosis not present

## 2013-10-28 DIAGNOSIS — Z45018 Encounter for adjustment and management of other part of cardiac pacemaker: Secondary | ICD-10-CM | POA: Diagnosis not present

## 2013-10-28 DIAGNOSIS — E119 Type 2 diabetes mellitus without complications: Secondary | ICD-10-CM | POA: Diagnosis not present

## 2013-10-28 DIAGNOSIS — I739 Peripheral vascular disease, unspecified: Secondary | ICD-10-CM | POA: Diagnosis not present

## 2013-10-28 DIAGNOSIS — I4891 Unspecified atrial fibrillation: Secondary | ICD-10-CM | POA: Diagnosis not present

## 2013-10-28 DIAGNOSIS — E785 Hyperlipidemia, unspecified: Secondary | ICD-10-CM | POA: Diagnosis not present

## 2013-10-28 MED ORDER — SODIUM CHLORIDE 0.9 % IR SOLN
80.0000 mg | Status: AC
  Filled 2013-10-28: qty 2

## 2013-10-28 MED ORDER — CEFAZOLIN SODIUM-DEXTROSE 2-3 GM-% IV SOLR: 2.0000 g | INTRAVENOUS | Status: AC

## 2013-10-29 ENCOUNTER — Encounter: Payer: Self-pay | Admitting: Cardiovascular Disease

## 2013-10-29 ENCOUNTER — Ambulatory Visit (HOSPITAL_COMMUNITY)
Admission: RE | Admit: 2013-10-29 | Discharge: 2013-10-29 | Disposition: A | Discharge status: Home / Self Care | Payer: Medicare Other | Source: Ambulatory Visit | Attending: Cardiovascular Disease | Admitting: Cardiovascular Disease

## 2013-10-29 ENCOUNTER — Encounter (HOSPITAL_COMMUNITY)
Admission: RE | Disposition: A | Discharge status: Home / Self Care | Payer: Self-pay | Source: Ambulatory Visit | Attending: Cardiovascular Disease

## 2013-10-29 DIAGNOSIS — G7109 Other specified muscular dystrophies: Secondary | ICD-10-CM | POA: Insufficient documentation

## 2013-10-29 DIAGNOSIS — K219 Gastro-esophageal reflux disease without esophagitis: Secondary | ICD-10-CM | POA: Insufficient documentation

## 2013-10-29 DIAGNOSIS — I739 Peripheral vascular disease, unspecified: Secondary | ICD-10-CM | POA: Insufficient documentation

## 2013-10-29 DIAGNOSIS — I4891 Unspecified atrial fibrillation: Secondary | ICD-10-CM | POA: Insufficient documentation

## 2013-10-29 DIAGNOSIS — Z7901 Long term (current) use of anticoagulants: Secondary | ICD-10-CM | POA: Insufficient documentation

## 2013-10-29 DIAGNOSIS — M5137 Other intervertebral disc degeneration, lumbosacral region: Secondary | ICD-10-CM | POA: Insufficient documentation

## 2013-10-29 DIAGNOSIS — Z860100 Personal history of colon polyps, unspecified: Secondary | ICD-10-CM | POA: Insufficient documentation

## 2013-10-29 DIAGNOSIS — I495 Sick sinus syndrome: Secondary | ICD-10-CM

## 2013-10-29 DIAGNOSIS — Z950 Presence of cardiac pacemaker: Secondary | ICD-10-CM

## 2013-10-29 DIAGNOSIS — G4730 Sleep apnea, unspecified: Secondary | ICD-10-CM | POA: Insufficient documentation

## 2013-10-29 DIAGNOSIS — M51379 Other intervertebral disc degeneration, lumbosacral region without mention of lumbar back pain or lower extremity pain: Secondary | ICD-10-CM | POA: Insufficient documentation

## 2013-10-29 DIAGNOSIS — Z87891 Personal history of nicotine dependence: Secondary | ICD-10-CM | POA: Insufficient documentation

## 2013-10-29 DIAGNOSIS — E785 Hyperlipidemia, unspecified: Secondary | ICD-10-CM | POA: Insufficient documentation

## 2013-10-29 DIAGNOSIS — Z45018 Encounter for adjustment and management of other part of cardiac pacemaker: Secondary | ICD-10-CM | POA: Insufficient documentation

## 2013-10-29 DIAGNOSIS — E119 Type 2 diabetes mellitus without complications: Secondary | ICD-10-CM | POA: Insufficient documentation

## 2013-10-29 DIAGNOSIS — Z45010 Encounter for checking and testing of cardiac pacemaker pulse generator [battery]: Secondary | ICD-10-CM

## 2013-10-29 HISTORY — PX: IMPLANTABLE CARDIOVERTER DEFIBRILLATOR GENERATOR CHANGE: SHX5474

## 2013-10-29 LAB — SURGICAL PCR SCREEN
MRSA, PCR: NEGATIVE
Staphylococcus aureus: NEGATIVE

## 2013-10-29 SURGERY — IMPLANTABLE CARDIOVERTER DEFIBRILLATOR GENERATOR CHANGE
Anesthesia: LOCAL | Laterality: Left

## 2013-10-29 MED ORDER — HYDROCODONE-ACETAMINOPHEN 5-325 MG PO TABS: 1.0000 | ORAL_TABLET | ORAL | Status: DC | PRN

## 2013-10-29 MED ORDER — LIDOCAINE HCL (PF) 1 % IJ SOLN
INTRAMUSCULAR | Status: AC
Start: 2013-10-29 — End: 2013-10-29
  Filled 2013-10-29: qty 60

## 2013-10-29 MED ORDER — SODIUM CHLORIDE 0.9 % IV SOLN
INTRAVENOUS | Status: DC
  Administered 2013-10-29: 14:00:00 via INTRAVENOUS

## 2013-10-29 MED ORDER — SODIUM CHLORIDE 0.9 % IJ SOLN: 3.0000 mL | INTRAMUSCULAR | Status: DC | PRN

## 2013-10-29 MED ORDER — ACETAMINOPHEN 325 MG PO TABS: 325.0000 mg | ORAL_TABLET | ORAL | Status: DC | PRN

## 2013-10-29 MED ORDER — ONDANSETRON HCL 4 MG/2ML IJ SOLN: 4.0000 mg | Freq: Four times a day (QID) | INTRAMUSCULAR | Status: DC | PRN

## 2013-10-29 MED ORDER — APIXABAN 5 MG PO TABS
5.0000 mg | ORAL_TABLET | Freq: Two times a day (BID) | ORAL | Status: DC
Start: 2013-11-01 — End: 2014-08-27

## 2013-10-29 MED ORDER — MIDAZOLAM HCL 5 MG/5ML IJ SOLN
INTRAMUSCULAR | Status: AC
  Filled 2013-10-29: qty 5

## 2013-10-29 MED ORDER — SODIUM CHLORIDE 0.9 % IV SOLN: INTRAVENOUS | Status: DC

## 2013-10-29 MED ORDER — FENTANYL CITRATE 0.05 MG/ML IJ SOLN
INTRAMUSCULAR | Status: AC
  Filled 2013-10-29: qty 2

## 2013-10-29 MED ORDER — MUPIROCIN 2 % EX OINT
TOPICAL_OINTMENT | CUTANEOUS | Status: AC
  Filled 2013-10-29: qty 22

## 2013-10-29 MED ORDER — MUPIROCIN 2 % EX OINT
TOPICAL_OINTMENT | Freq: Once | CUTANEOUS | Status: AC
  Administered 2013-10-29: 14:00:00 via NASAL
  Filled 2013-10-29: qty 22

## 2013-10-29 MED ORDER — CEFAZOLIN SODIUM-DEXTROSE 2-3 GM-% IV SOLR
INTRAVENOUS | Status: DC
Start: 2013-10-29 — End: 2013-10-29
  Filled 2013-10-29: qty 50

## 2013-10-29 NOTE — Discharge Instructions (Signed)
Supplemental Discharge Instructions for  Pacemaker/Defibrillator Patients  Activity No restrictions.    DO wear your seatbelt, even if it crosses over the pacemaker site.  WOUND CARE   Keep the wound area clean and dry.  Remove the dressing the day after you return home (usually 48 hours after the procedure).   DO NOT SUBMERGE UNDER WATER UNTIL FULLY HEALED (no tub baths, hot tubs, swimming pools, etc.).    You  may shower or take a sponge bath after the dressing is removed. DO NOT SOAK the area and do not allow the shower to directly spray on the site.   If you have staples, these will be removed in the office in 7-14 days.   If you have tape/steri-strips on your wound, these will fall off; do not pull them off prematurely.     No bandage is needed on the site.  DO  NOT apply any creams, oils, or ointments to the wound area.   If you notice any drainage or discharge from the wound, any swelling, excessive redness or bruising at the site, or if you develop a fever > 101? F after you are discharged home, call the office at once.  Special Instructions   You are still able to use cellular telephones.  Avoid carrying your cellular phone near your device.   When traveling through airports, show security personnel your identification card to avoid being screened in the metal detectors.    Avoid arc welding equipment, MRI testing (magnetic resonance imaging), TENS units (transcutaneous nerve stimulators).  Call the office for questions about other devices.   Avoid electrical appliances that are in poor condition or are not properly grounded.   Microwave ovens are safe to be near or to operate.  RESTART APIXABAN (ELIQUIS) on 11/01/2013, same as previous dose.

## 2013-10-29 NOTE — H&P (View-Only) (Signed)
Patient ID: [name], female   DOB: 1946-02-22, 68 y.o.   MRN: [id number]      Reason for office visit Pacemaker at elective replacement indicator  Mrs. [name] is here to discuss pacemaker generator change out. Her device (Medtronic, implanted 2006 sinus node dysfunction) reached ERI on June 5th. Prior to switching to VVI she paced the atrium virtually 100% of the time and paced the ventricle roughly 34% of the time. Despite converting to VVI mode she does not appear to be troubled by symptoms of pacemaker syndrome or chronotropic incompetence. Her device does not allow reprogramming to DDD mode.  She has a history of paroxysmal atrial fibrillation, none recorded recently (she had an 11 hour episode in January). She takes apixaban. 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 She had a normal left ventricular ejection fraction by echo in January of 2015 and a normal nuclear stress test in 2004, but has not had recent assessment for ischemic heart disease. 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 She continues to smoke.   Allergies  Allergen Reactions  . Prednisone Other (See Comments)    Chest pain    Current Outpatient Prescriptions  Medication Sig Dispense Refill  . albuterol (PROVENTIL) (2.5 MG/3ML) 0.083% nebulizer solution Take 2.5 mg by nebulization every 6 (six) hours as needed for shortness of breath.       Marland Kitchen apixaban (ELIQUIS) 5 MG TABS tablet Take 1 tablet (5 mg total) by mouth 2 (two) times daily.  60 tablet  5  . furosemide (LASIX) 40 MG tablet Take 40 mg by mouth.      Marland Kitchen guaiFENesin (MUCINEX) 600 MG 12 hr tablet Take 1 tablet (600 mg total) by mouth 2 (two) times daily.  30 tablet  0  .  ketoconazole (NIZORAL) 2 % cream Apply topically 2 (two) times daily.  15 g  0  . oxyCODONE-acetaminophen (PERCOCET) 10-325 MG per tablet Take 1 tablet by mouth every 6 (six) hours as needed for pain.       . pantoprazole (PROTONIX) 40 MG tablet Take 40 mg by mouth every morning.       . simvastatin (ZOCOR) 40 MG tablet Take 40 mg by mouth every evening.         No current facility-administered medications for this visit.    Past Medical History  Diagnosis Date  . Sleep apnea     uses cpap  . Peripheral vascular disease   . GERD (gastroesophageal reflux disease)   . Neuromuscular disorder     HX of MD  . Diabetes   . Sinus node dysfunction   . H/O cardiac pacemaker   . Tobacco abuse   . Hyperlipidemia   . SSS (sick sinus syndrome)   . DDD (degenerative disc disease), lumbar     Past Surgical History  Procedure Laterality Date  . Insert / replace / remove pacemaker    . Cholecystectomy    . Abdominal hysterectomy    . Tonsillectomy    . Tubal ligation    . Femoral artery stent  02/18/2011  . Esophagogastroduodenoscopy  04/13/2002    A short segment of salmon-colored epithelium in the distal esophagus  consistent with Barrett's esophagus, biopsied/ The remainder of the esophageal mucosa, stomach, and  duodenum through the second portion appeared normal  . Colonoscopy  04/13/2002    Diminutive polyps in the rectum cold biopsied/removed/ The remainder of the rectal mucosa and colon appeared normal.    No family history on file.  History   Social History  . Marital Status: Married    Spouse Name: N/A    Number of Children: N/A  . Years of Education: N/A   Occupational History  . Not on file.   Social History Main Topics  . Smoking status: Former Smoker -- 0.50 packs/day for 50 years    Types: Cigarettes    Quit date: 02/02/2013  . Smokeless tobacco: Never Used  . Alcohol Use: No  . Drug Use: No  . Sexual Activity: Not on file   Other Topics Concern  . Not on  file   Social History Narrative  . No narrative on file    Review of systems: Complains of fatigue and some shortness of breath with activity but is not particularly prominent. It sounds that are intermittent claudication is actually the major limiting factor. She has more discomfort in her left leg. She denies angina, edema, syncope, palpitations or dizziness.   PHYSICAL EXAM BP 88/48  Pulse 65  Ht 5\' 1"  (1.549 m)  Wt 145 lb (65.772 kg)  BMI 27.41 kg/m2 General: Alert, oriented x3, no distress  Head: no evidence of trauma, PERRL, EOMI, no exophtalmos or lid lag, no myxedema, no xanthelasma; normal ears, nose and oropharynx  Neck: normal jugular venous pulsations and no hepatojugular reflux; brisk carotid pulses without delay and no carotid bruits  Chest: clear to auscultation, no signs of consolidation by percussion or palpation, normal fremitus, symmetrical and full respiratory excursions  Cardiovascular: normal position and quality of the apical impulse, regular rhythm, normal first and second heart sounds, no murmurs, rubs or gallops  Abdomen: no tenderness or distention, no masses by palpation, no abnormal pulsatility or arterial bruits, normal bowel sounds, no hepatosplenomegaly  Extremities: no clubbing, cyanosis or edema; 2+ radial, ulnar and brachial pulses bilaterally; 2+ right femoral, posterior tibial and dorsalis pedis pulses; 2+ left femoral, absent posterior tibial and dorsalis pedis pulses; no subclavian or femoral bruits  Neurological: grossly nonfocal   BMET    Component Value Date/Time   NA 147 08/06/2013 0502   K 3.5* 08/06/2013 0502   CL 109 08/06/2013 0502   CO2 27 08/06/2013 0502   GLUCOSE 131* 08/06/2013 0502   BUN 13 08/06/2013 0502   CREATININE 1.01 08/06/2013 0502   CREATININE 0.96 10/23/2012 1215   CALCIUM 9.7 08/06/2013 0502   GFRNONAA 56* 08/06/2013 0502   GFRAA 65* 08/06/2013 0502     ASSESSMENT AND PLAN  Mrs. [name] requires a dual chamber permanent pacemaker  for symptomatic sinus bradycardia. Her device has reached elective replacement indicator. She'll be scheduled for a generator change out. The risks and benefits of this procedure were discussed with the patient and her son in detail. She will discontinue the Eliquis 48 hours before the planned procedure. She has not had atrial fibrillation in months.  Orders Placed This Encounter  Procedures  . INR/PT  . PTT  . CBC  . Basic Metabolic Panel (BMET)  . TSH  . EKG 12-Lead  . PACEMAKER GENERATOR CHANGE   Sukanya Goldblatt  Sanda Klein, MD, [place] [phone] office [phone] pager

## 2013-10-29 NOTE — Op Note (Signed)
Procedure report  Procedure performed:  Dual chamber pacemaker generator changeout    Reason for procedure:  1. Device generator at elective replacement interval  2. Symptomatic sinus bradycardia Procedure performed by:  Sanda Klein, MD  Complications:  None  Estimated blood loss:  <5 mL  Medications administered during procedure:  Ancef 2 g intravenously, lidocaine 1% 30 mL locally  Device details:   New Generator Medtronic Adapta L , model number ADDRL1, serial number  [id number] Right atrial lead (chronic) Medtronic [id number] , serial number [id number] (03/29/2005) Right ventricular lead (chronic)  Medtronic [id number], serial number [id number] (implanted 03/29/2005)  Explanted generator Medtronic Adapta,  model number ADDR01, serial number [id number] implanted (03/29/2005)  Procedure details:  After the risks and benefits of the procedure were discussed the patient provided informed consent. She was brought to the cardiac catheter lab in the fasting state. The patient was prepped and draped in usual sterile fashion. Local anesthesia with 1% lidocaine was administered to to the left infraclavicular area. A 5-6cm horizontal incision was made parallel with and 2-3 cm caudal to the left clavicle, in the area of an old scar. Using minimal electrocautery and mostly sharp and blunt dissection the prepectoral pocket was opened carefully to avoid injury to the loops of chronic leads. Extensive dissection was not necessary. The device was explanted. The pocket was carefully inspected for hemostasis and flushed with copious amounts of antibiotic solution.  The leads were disconnected from the old generator and testing of the lead parameters later showed excellent values. The new generator was connected to the chronic leads, with appropriate pacing noted.   The entire system was then carefully inserted in the pocket with care been taking that the leads and device assumed a comfortable position without  pressure on the incision. Great care was taken that the leads be located deep to the generator. The pocket was then closed in layers using 2 layers of 2-0 Vicryl and cutaneous staples after which a sterile dressing was applied.   At the end of the procedure the following lead parameters were encountered:   Right atrial lead sensed P waves 2.5 mV, impedance 364 ohms, threshold 1.0V at 0.5 ms pulse width.  Right ventricular lead sensed R waves  8-11 mV mV, impedance 794 ohms, threshold 0.5 at 0.4 ms pulse width. Sanda Klein, MD, [facility] [phone] office [phone] pager

## 2013-10-29 NOTE — Interval H&P Note (Signed)
History and Physical Interval Note:  10/29/2013 12:48 PM  [name]  has presented today for surgery, with the diagnosis of EOL  The various methods of treatment have been discussed with the patient and family. After consideration of risks, benefits and other options for treatment, the patient has consented to  Procedure(s): PACEMAKER GENERATOR CHANGE (Left) as a surgical intervention .  The patient's history has been reviewed, patient examined, no change in status, stable for surgery.  I have reviewed the patient's chart and labs.  Questions were answered to the patient's satisfaction.     Domingos Riggi

## 2013-10-31 ENCOUNTER — Telehealth: Payer: Self-pay | Admitting: Cardiovascular Disease

## 2013-11-05 NOTE — Telephone Encounter (Signed)
Closed enounter °

## 2013-11-06 ENCOUNTER — Ambulatory Visit: Payer: Medicare Other

## 2013-11-07 ENCOUNTER — Telehealth: Payer: Self-pay | Admitting: Cardiology

## 2013-11-07 NOTE — Telephone Encounter (Signed)
Closed encounter °

## 2013-11-08 ENCOUNTER — Encounter: Payer: Self-pay | Admitting: Cardiovascular Disease

## 2013-11-12 ENCOUNTER — Encounter: Payer: Medicare Other | Admitting: Cardiovascular Disease

## 2013-11-13 ENCOUNTER — Encounter: Payer: Self-pay | Admitting: Cardiology

## 2013-11-13 ENCOUNTER — Ambulatory Visit (INDEPENDENT_AMBULATORY_CARE_PROVIDER_SITE_OTHER): Payer: Medicare Other | Admitting: Cardiology

## 2013-11-13 VITALS — BP 116/68 | HR 72 | Ht 62.0 in | Wt 141.1 lb

## 2013-11-13 DIAGNOSIS — I480 Paroxysmal atrial fibrillation: Secondary | ICD-10-CM

## 2013-11-13 DIAGNOSIS — Z950 Presence of cardiac pacemaker: Secondary | ICD-10-CM

## 2013-11-13 DIAGNOSIS — I739 Peripheral vascular disease, unspecified: Secondary | ICD-10-CM

## 2013-11-13 DIAGNOSIS — E1159 Type 2 diabetes mellitus with other circulatory complications: Secondary | ICD-10-CM

## 2013-11-13 DIAGNOSIS — Z7901 Long term (current) use of anticoagulants: Secondary | ICD-10-CM | POA: Insufficient documentation

## 2013-11-13 DIAGNOSIS — I4891 Unspecified atrial fibrillation: Secondary | ICD-10-CM

## 2013-11-13 DIAGNOSIS — G7111 Myotonic muscular dystrophy: Secondary | ICD-10-CM

## 2013-11-13 NOTE — Patient Instructions (Signed)
    Continue with current medications   Your physician wants you to follow-up in [place] with Dr Sallyanne Kuster. You will receive a reminder letter in the mail two months in advance. If you don't receive a letter, please call our office to schedule the follow-up appointment.

## 2013-11-13 NOTE — Assessment & Plan Note (Signed)
Prior LSFA PTA, chronically occluded RSFA

## 2013-11-13 NOTE — Assessment & Plan Note (Signed)
Dual-chamber Medtronic Adapta implanted 2006. Gen change 10/29/13 secondary to EOL.

## 2013-11-13 NOTE — Progress Notes (Signed)
    11/13/2013 [name]   Aug 31, 1945  [id number]  Primary Physicia Robert Bellow, MD Primary Cardiologist: Dr Sallyanne Kuster  HPI:  68 y/o with multiple medical problems as outlined below. She is in the office today for a site check after she had a MDT generator change 10/29/13 secondary to EOL.   Current Outpatient Prescriptions  Medication Sig Dispense Refill  . albuterol (PROVENTIL) (2.5 MG/3ML) 0.083% nebulizer solution Take 2.5 mg by nebulization every 6 (six) hours as needed for wheezing or shortness of breath.       Marland Kitchen apixaban (ELIQUIS) 5 MG TABS tablet Take 1 tablet (5 mg total) by mouth 2 (two) times daily.  60 tablet  5  . furosemide (LASIX) 40 MG tablet Take 40 mg by mouth daily.       Marland Kitchen guaiFENesin (MUCINEX) 600 MG 12 hr tablet Take 1 tablet (600 mg total) by mouth 2 (two) times daily.  30 tablet  0  . OVER THE COUNTER MEDICATION Take 2 tablets by mouth daily. [employer] brand stool softener      . oxyCODONE-acetaminophen (PERCOCET) 10-325 MG per tablet Take 1 tablet by mouth every 6 (six) hours as needed for pain.       . pantoprazole (PROTONIX) 20 MG tablet Take 20 mg by mouth daily.      . simvastatin (ZOCOR) 40 MG tablet Take 40 mg by mouth every evening.         No current facility-administered medications for this visit.    Allergies  Allergen Reactions  . Prednisone Other (See Comments)    Chest pain    History   Social History  . Marital Status: Widowed    Spouse Name: N/A    Number of Children: N/A  . Years of Education: N/A   Occupational History  . Not on file.   Social History Main Topics  . Smoking status: Current Every Day Smoker -- 0.50 packs/day for 50 years    Types: Cigarettes    Last Attempt to Quit: 02/02/2013  . Smokeless tobacco: Never Used     Comment: "LIGHT THEM AND LET THEM BURN UP"  . Alcohol Use: No  . Drug Use: No  . Sexual Activity: Not on file   Other Topics Concern  . Not on file   Social History Narrative  . No  narrative on file     Review of Systems: General: negative for chills, fever, night sweats or weight changes.  Cardiovascular: negative for chest pain, dyspnea on exertion, edema, orthopnea, palpitations, paroxysmal nocturnal dyspnea or shortness of breath Dermatological: negative for rash Respiratory: negative for cough or wheezing Urologic: negative for hematuria Abdominal: negative for nausea, vomiting, diarrhea, bright red blood per rectum, melena, or hematemesis Neurologic: negative for visual changes, syncope, or dizziness All other systems reviewed and are otherwise negative except as noted above.    Blood pressure 116/68, pulse 72, height 5\' 2"  (1.575 m), weight 141 lb 1.6 oz (64.003 kg).  General appearance: alert, cooperative, no distress, pale and frail, in wheel chair, Pacer site without hematoma. Staples removed without problems.   ASSESSMENT AND PLAN:   Pacemaker Dual-chamber Medtronic Adapta implanted 2006. Gen change 10/29/13 secondary to EOL.  Paroxysmal atrial fibrillation .  Chronic anticoagulation .  Type 2 diabetes mellitus Diet controlled  Claudication Prior LSFA PTA, chronically occluded RSFA  COPD (chronic obstructive pulmonary disease) .  Myotonic dystrophy .   PLAN  Dr Sallyanne Kuster 3 months  [facility] 11/13/2013 4:15 PM

## 2013-11-13 NOTE — Assessment & Plan Note (Signed)
Diet controlled.  

## 2013-11-22 ENCOUNTER — Ambulatory Visit: Payer: Medicare Other | Admitting: Cardiovascular Disease

## 2014-03-13 ENCOUNTER — Encounter (HOSPITAL_COMMUNITY): Payer: Self-pay | Admitting: Cardiovascular Disease

## 2014-03-18 ENCOUNTER — Encounter: Payer: Medicare Other | Admitting: Cardiovascular Disease

## 2014-05-26 ENCOUNTER — Encounter: Payer: Self-pay | Admitting: *Deleted

## 2014-06-03 ENCOUNTER — Encounter: Payer: Medicare Other | Admitting: Cardiovascular Disease

## 2014-08-25 ENCOUNTER — Encounter (HOSPITAL_COMMUNITY): Payer: Self-pay | Admitting: Emergency Medicine

## 2014-08-25 ENCOUNTER — Emergency Department (HOSPITAL_COMMUNITY): Payer: Medicare Other

## 2014-08-25 ENCOUNTER — Inpatient Hospital Stay (HOSPITAL_COMMUNITY)
Admission: EM | Admit: 2014-08-25 | Discharge: 2014-08-27 | DRG: 811 | Disposition: A | Discharge status: Home / Self Care | Payer: Medicare Other | Attending: Family Medicine | Admitting: Family Medicine

## 2014-08-25 DIAGNOSIS — J9601 Acute respiratory failure with hypoxia: Secondary | ICD-10-CM | POA: Diagnosis present

## 2014-08-25 DIAGNOSIS — I5032 Chronic diastolic (congestive) heart failure: Secondary | ICD-10-CM | POA: Diagnosis present

## 2014-08-25 DIAGNOSIS — J438 Other emphysema: Secondary | ICD-10-CM | POA: Diagnosis not present

## 2014-08-25 DIAGNOSIS — G7100 Muscular dystrophy, unspecified: Secondary | ICD-10-CM | POA: Diagnosis present

## 2014-08-25 DIAGNOSIS — G4733 Obstructive sleep apnea (adult) (pediatric): Secondary | ICD-10-CM | POA: Diagnosis present

## 2014-08-25 DIAGNOSIS — R531 Weakness: Secondary | ICD-10-CM

## 2014-08-25 DIAGNOSIS — D509 Iron deficiency anemia, unspecified: Secondary | ICD-10-CM | POA: Diagnosis present

## 2014-08-25 DIAGNOSIS — Z950 Presence of cardiac pacemaker: Secondary | ICD-10-CM | POA: Diagnosis present

## 2014-08-25 DIAGNOSIS — D649 Anemia, unspecified: Secondary | ICD-10-CM | POA: Diagnosis not present

## 2014-08-25 DIAGNOSIS — N390 Urinary tract infection, site not specified: Secondary | ICD-10-CM | POA: Diagnosis present

## 2014-08-25 DIAGNOSIS — J449 Chronic obstructive pulmonary disease, unspecified: Secondary | ICD-10-CM | POA: Diagnosis present

## 2014-08-25 DIAGNOSIS — R778 Other specified abnormalities of plasma proteins: Secondary | ICD-10-CM

## 2014-08-25 DIAGNOSIS — I739 Peripheral vascular disease, unspecified: Secondary | ICD-10-CM | POA: Diagnosis present

## 2014-08-25 DIAGNOSIS — F17210 Nicotine dependence, cigarettes, uncomplicated: Secondary | ICD-10-CM | POA: Diagnosis present

## 2014-08-25 DIAGNOSIS — Z7901 Long term (current) use of anticoagulants: Secondary | ICD-10-CM | POA: Diagnosis not present

## 2014-08-25 DIAGNOSIS — R0600 Dyspnea, unspecified: Secondary | ICD-10-CM | POA: Diagnosis not present

## 2014-08-25 DIAGNOSIS — I480 Paroxysmal atrial fibrillation: Secondary | ICD-10-CM

## 2014-08-25 DIAGNOSIS — R0602 Shortness of breath: Secondary | ICD-10-CM | POA: Diagnosis present

## 2014-08-25 DIAGNOSIS — I447 Left bundle-branch block, unspecified: Secondary | ICD-10-CM | POA: Diagnosis present

## 2014-08-25 DIAGNOSIS — E119 Type 2 diabetes mellitus without complications: Secondary | ICD-10-CM | POA: Diagnosis present

## 2014-08-25 DIAGNOSIS — E785 Hyperlipidemia, unspecified: Secondary | ICD-10-CM | POA: Diagnosis present

## 2014-08-25 DIAGNOSIS — R7989 Other specified abnormal findings of blood chemistry: Secondary | ICD-10-CM

## 2014-08-25 DIAGNOSIS — K219 Gastro-esophageal reflux disease without esophagitis: Secondary | ICD-10-CM | POA: Diagnosis present

## 2014-08-25 DIAGNOSIS — Z79899 Other long term (current) drug therapy: Secondary | ICD-10-CM | POA: Diagnosis not present

## 2014-08-25 DIAGNOSIS — B964 Proteus (mirabilis) (morganii) as the cause of diseases classified elsewhere: Secondary | ICD-10-CM | POA: Diagnosis present

## 2014-08-25 DIAGNOSIS — J9621 Acute and chronic respiratory failure with hypoxia: Secondary | ICD-10-CM | POA: Diagnosis present

## 2014-08-25 DIAGNOSIS — G4730 Sleep apnea, unspecified: Secondary | ICD-10-CM

## 2014-08-25 LAB — BASIC METABOLIC PANEL
ANION GAP: 5 (ref 5–15)
BUN: 17 mg/dL (ref 6–20)
CHLORIDE: 108 mmol/L (ref 101–111)
CO2: 27 mmol/L (ref 22–32)
Calcium: 9.5 mg/dL (ref 8.9–10.3)
Creatinine, Ser: 1.1 mg/dL — ABNORMAL HIGH (ref 0.44–1.00)
GFR, EST AFRICAN AMERICAN: 58 mL/min — AB (ref >60)
GFR, EST NON AFRICAN AMERICAN: 50 mL/min — AB (ref >60)
Glucose, Bld: 131 mg/dL — ABNORMAL HIGH (ref 65–99)
Potassium: 4.4 mmol/L (ref 3.5–5.1)
SODIUM: 140 mmol/L (ref 135–145)

## 2014-08-25 LAB — URINALYSIS, ROUTINE W REFLEX MICROSCOPIC
Bilirubin Urine: NEGATIVE
Glucose, UA: NEGATIVE mg/dL
Ketones, ur: NEGATIVE mg/dL
NITRITE: POSITIVE — AB
Specific Gravity, Urine: 1.02 (ref 1.005–1.030)
Urobilinogen, UA: 0.2 mg/dL (ref 0.0–1.0)
pH: 6 (ref 5.0–8.0)

## 2014-08-25 LAB — RETICULOCYTES
RBC.: 3.64 MIL/uL — ABNORMAL LOW (ref 3.87–5.11)
RETIC CT PCT: 2.3 % (ref 0.4–3.1)
Retic Count, Absolute: 83.7 10*3/uL (ref 19.0–186.0)

## 2014-08-25 LAB — GLUCOSE, CAPILLARY: Glucose-Capillary: 104 mg/dL — ABNORMAL HIGH (ref 65–99)

## 2014-08-25 LAB — HEPATIC FUNCTION PANEL
ALK PHOS: 70 U/L (ref 38–126)
ALT: 9 U/L — ABNORMAL LOW (ref 14–54)
AST: 17 U/L (ref 15–41)
Albumin: 3.7 g/dL (ref 3.5–5.0)
Bilirubin, Direct: 0.1 mg/dL (ref 0.1–0.5)
Indirect Bilirubin: 0.3 mg/dL (ref 0.3–0.9)
Total Bilirubin: 0.4 mg/dL (ref 0.3–1.2)
Total Protein: 7.1 g/dL (ref 6.5–8.1)

## 2014-08-25 LAB — TSH: TSH: 1.063 u[IU]/mL (ref 0.350–4.500)

## 2014-08-25 LAB — BRAIN NATRIURETIC PEPTIDE: B NATRIURETIC PEPTIDE 5: 886 pg/mL — AB (ref 0.0–100.0)

## 2014-08-25 LAB — ABO/RH: ABO/RH(D): A POS

## 2014-08-25 LAB — CBC WITH DIFFERENTIAL/PLATELET
BASOS ABS: 0.1 10*3/uL (ref 0.0–0.1)
Basophils Relative: 1 % (ref 0–1)
EOS ABS: 0.2 10*3/uL (ref 0.0–0.7)
EOS PCT: 4 % (ref 0–5)
HCT: 27.3 % — ABNORMAL LOW (ref 36.0–46.0)
Hemoglobin: 6.8 g/dL — CL (ref 12.0–15.0)
Lymphocytes Relative: 31 % (ref 12–46)
Lymphs Abs: 2 10*3/uL (ref 0.7–4.0)
MCH: 18.6 pg — AB (ref 26.0–34.0)
MCHC: 24.9 g/dL — AB (ref 30.0–36.0)
MCV: 74.6 fL — AB (ref 78.0–100.0)
Monocytes Absolute: 0.5 10*3/uL (ref 0.1–1.0)
Monocytes Relative: 7 % (ref 3–12)
NEUTROS PCT: 58 % (ref 43–77)
Neutro Abs: 3.9 10*3/uL (ref 1.7–7.7)
PLATELETS: 175 10*3/uL (ref 150–400)
RBC: 3.66 MIL/uL — ABNORMAL LOW (ref 3.87–5.11)
RDW: 21.1 % — AB (ref 11.5–15.5)
WBC: 6.7 10*3/uL (ref 4.0–10.5)

## 2014-08-25 LAB — TROPONIN I
Troponin I: 0.03 ng/mL (ref <0.031)
Troponin I: 0.03 ng/mL (ref <0.031)

## 2014-08-25 LAB — URINE MICROSCOPIC-ADD ON

## 2014-08-25 LAB — I-STAT CG4 LACTIC ACID, ED: Lactic Acid, Venous: 1.29 mmol/L (ref 0.5–2.0)

## 2014-08-25 LAB — LACTATE DEHYDROGENASE: LDH: 152 U/L (ref 98–192)

## 2014-08-25 LAB — PREPARE RBC (CROSSMATCH)

## 2014-08-25 MED ORDER — SODIUM CHLORIDE 0.9 % IJ SOLN
3.0000 mL | Freq: Two times a day (BID) | INTRAMUSCULAR | Status: DC
  Administered 2014-08-26 (×2): 3 mL via INTRAVENOUS

## 2014-08-25 MED ORDER — SODIUM CHLORIDE 0.9 % IJ SOLN
3.0000 mL | Freq: Two times a day (BID) | INTRAMUSCULAR | Status: DC
  Administered 2014-08-25 – 2014-08-27 (×2): 3 mL via INTRAVENOUS

## 2014-08-25 MED ORDER — ONDANSETRON HCL 4 MG PO TABS: 4.0000 mg | ORAL_TABLET | Freq: Four times a day (QID) | ORAL | Status: DC | PRN

## 2014-08-25 MED ORDER — CEFTRIAXONE SODIUM IN DEXTROSE 20 MG/ML IV SOLN
1.0000 g | INTRAVENOUS | Status: DC
  Filled 2014-08-25: qty 50

## 2014-08-25 MED ORDER — SODIUM CHLORIDE 0.9 % IV SOLN
Freq: Once | INTRAVENOUS | Status: AC
  Administered 2014-08-25: 23:00:00 via INTRAVENOUS

## 2014-08-25 MED ORDER — DEXTROSE 5 % IV SOLN
1.0000 g | Freq: Once | INTRAVENOUS | Status: AC
  Administered 2014-08-25: 1 g via INTRAVENOUS
  Filled 2014-08-25: qty 10

## 2014-08-25 MED ORDER — SODIUM CHLORIDE 0.9 % IV SOLN: 250.0000 mL | INTRAVENOUS | Status: DC | PRN

## 2014-08-25 MED ORDER — OXYCODONE-ACETAMINOPHEN 10-325 MG PO TABS: 1.0000 | ORAL_TABLET | Freq: Four times a day (QID) | ORAL | Status: DC | PRN

## 2014-08-25 MED ORDER — OXYCODONE-ACETAMINOPHEN 5-325 MG PO TABS
1.0000 | ORAL_TABLET | Freq: Four times a day (QID) | ORAL | Status: DC | PRN
  Administered 2014-08-25 – 2014-08-27 (×3): 1 via ORAL
  Filled 2014-08-25 (×3): qty 1

## 2014-08-25 MED ORDER — FUROSEMIDE 10 MG/ML IJ SOLN
40.0000 mg | Freq: Once | INTRAMUSCULAR | Status: AC
  Administered 2014-08-26: 40 mg via INTRAVENOUS
  Filled 2014-08-25: qty 4

## 2014-08-25 MED ORDER — ALUM & MAG HYDROXIDE-SIMETH 200-200-20 MG/5ML PO SUSP: 30.0000 mL | Freq: Four times a day (QID) | ORAL | Status: DC | PRN

## 2014-08-25 MED ORDER — SODIUM CHLORIDE 0.9 % IV SOLN: 10.0000 mL/h | Freq: Once | INTRAVENOUS | Status: DC

## 2014-08-25 MED ORDER — PANTOPRAZOLE SODIUM 40 MG PO TBEC
40.0000 mg | DELAYED_RELEASE_TABLET | Freq: Every day | ORAL | Status: DC
Start: 2014-08-26 — End: 2014-08-27
  Administered 2014-08-26 – 2014-08-27 (×2): 40 mg via ORAL
  Filled 2014-08-25 (×3): qty 1

## 2014-08-25 MED ORDER — ALBUTEROL SULFATE (2.5 MG/3ML) 0.083% IN NEBU: 2.5000 mg | INHALATION_SOLUTION | Freq: Four times a day (QID) | RESPIRATORY_TRACT | Status: DC | PRN

## 2014-08-25 MED ORDER — ONDANSETRON HCL 4 MG/2ML IJ SOLN: 4.0000 mg | Freq: Four times a day (QID) | INTRAMUSCULAR | Status: DC | PRN

## 2014-08-25 MED ORDER — INSULIN ASPART 100 UNIT/ML ~~LOC~~ SOLN
0.0000 [IU] | Freq: Three times a day (TID) | SUBCUTANEOUS | Status: DC
  Administered 2014-08-26: 1 [IU] via SUBCUTANEOUS

## 2014-08-25 MED ORDER — OXYCODONE HCL 5 MG PO TABS
5.0000 mg | ORAL_TABLET | Freq: Four times a day (QID) | ORAL | Status: DC | PRN
  Administered 2014-08-25 – 2014-08-26 (×2): 5 mg via ORAL
  Filled 2014-08-25 (×2): qty 1

## 2014-08-25 MED ORDER — GUAIFENESIN ER 600 MG PO TB12
600.0000 mg | ORAL_TABLET | Freq: Two times a day (BID) | ORAL | Status: DC
  Administered 2014-08-25 – 2014-08-27 (×4): 600 mg via ORAL
  Filled 2014-08-25 (×5): qty 1

## 2014-08-25 MED ORDER — CETYLPYRIDINIUM CHLORIDE 0.05 % MT LIQD
7.0000 mL | Freq: Two times a day (BID) | OROMUCOSAL | Status: DC
  Administered 2014-08-25 – 2014-08-27 (×4): 7 mL via OROMUCOSAL

## 2014-08-25 MED ORDER — SODIUM CHLORIDE 0.9 % IJ SOLN: 3.0000 mL | INTRAMUSCULAR | Status: DC | PRN

## 2014-08-25 NOTE — ED Notes (Signed)
Patient sent from Dr Vickey Sages office for complaints of weakness worsening over 2 weeks. States "I think it's because of these blood thinners. I've been on them about three months and I just keep getting weaker and weaker."

## 2014-08-25 NOTE — ED Notes (Signed)
CRITICAL VALUE ALERT  Critical value received: hgb 6.8  Date of notification: 08/25/2014  Time of notification:  1800  Critical value read back:Yes.    Nurse who received alert:  Iona Coach   MD notified (1st page):  Alianza  Time of first page:  1800     Time MD responded:  1800

## 2014-08-25 NOTE — H&P (Signed)
PCP:   Robert Bellow, MD   Chief Complaint:  [name], sob  HPI: 69 yo female h/o muscular dystrophy, osa cpap dependent, SSS s/p pacemaker, pafib on eloquis comes in with over 2 weeks of progressive worsening sob and generalized weakness.  At her baseline she walks well with a walker, however she has gotten to the point where she can barely get out of bed. She denies any melena, brbpr, or abdominal pain.  No swelling or edema and no cough or fevers.  No n/v/d.  No dysuria.  No hematuria.  She has not history of chf, but is on lasix prn last echo in 1/15 with normal EF and grade 1 diastolic dysfunction.  She went to see her PCP today, who noted a significant change in her overall health so sent her to the ED for further evaluation.  On arrival to ED found to by hypoxic, with otherwise normal vitals.  She is referred for admission for worsening anemia, and uti.  Review of Systems:  Positive and negative as per HPI otherwise all other systems are negative  Past Medical History: Past Medical History  Diagnosis Date  . Sleep apnea     uses cpap  . Peripheral vascular disease   . GERD (gastroesophageal reflux disease)   . Neuromuscular disorder     HX of MD  . Diabetes   . Sinus node dysfunction   . H/O cardiac pacemaker   . Tobacco abuse   . Hyperlipidemia   . SSS (sick sinus syndrome)   . DDD (degenerative disc disease), lumbar    Past Surgical History  Procedure Laterality Date  . Insert / replace / remove pacemaker    . Cholecystectomy    . Abdominal hysterectomy    . Tonsillectomy    . Tubal ligation    . Femoral artery stent  02/18/2011  . Esophagogastroduodenoscopy  04/13/2002    A short segment of salmon-colored epithelium in the distal esophagus  consistent with Barrett's esophagus, biopsied/ The remainder of the esophageal mucosa, stomach, and duodenum through the second portion appeared normal  . Colonoscopy  04/13/2002    Diminutive polyps in the rectum cold  biopsied/removed/ The remainder of the rectal mucosa and colon appeared normal.  . Lower extremity angiogram N/A 02/18/2011    Procedure: LOWER EXTREMITY ANGIOGRAM;  Surgeon: Lorretta Harp, MD;  Location: [facility] CATH LAB;  Service: Cardiovascular;  Laterality: N/A;  . Lower extremity angiogram N/A 10/29/2012    Procedure: LOWER EXTREMITY ANGIOGRAM;  Surgeon: Lorretta Harp, MD;  Location: [facility] CATH LAB;  Service: Cardiovascular;  Laterality: N/A;  . Abdominal angiogram  10/29/2012    Procedure: ABDOMINAL ANGIOGRAM;  Surgeon: Lorretta Harp, MD;  Location: [facility] CATH LAB;  Service: Cardiovascular;;  . Implantable cardioverter defibrillator generator change Left 10/29/2013    Procedure: IMPLANTABLE CARDIOVERTER DEFIBRILLATOR GENERATOR CHANGE;  Surgeon: Sanda Klein, MD;  Location: [facility] CATH LAB;  Service: Cardiovascular;  Laterality: Left;  . Transthoracic echocardiogram  10/05/2011    EF 50-55% Pacemaker present,   . Transthoracic echocardiogram  05/20/2010    EF=>55%, pacer enduced LBBB, conduction abnormality  . Nm myocar perf wall motion  10/05/2011    Protocol:Lexiscan, mild perfusion defect due to pacing induced artifact  . Nm myocar perf wall motion  05/20/2010    Protocol:Persantine, EF78%, normal perfusion in all regions,     Medications: Prior to Admission medications   Medication Sig Start Date End Date Taking? Authorizing Provider  albuterol (PROVENTIL) (2.5 MG/3ML) 0.083%  nebulizer solution Take 2.5 mg by nebulization every 6 (six) hours as needed for wheezing or shortness of breath.    Yes Historical Provider, MD  apixaban (ELIQUIS) 5 MG TABS tablet Take 1 tablet (5 mg total) by mouth 2 (two) times daily. 11/01/13  Yes Mihai Croitoru, MD  docusate sodium (COLACE) 100 MG capsule Take 100 mg by mouth daily.   Yes Historical Provider, MD  furosemide (LASIX) 40 MG tablet Take 40 mg by mouth daily as needed for fluid.    Yes Historical Provider, MD  guaiFENesin (MUCINEX) 600 MG 12 hr  tablet Take 1 tablet (600 mg total) by mouth 2 (two) times daily. 04/15/13  Yes Nimish Luther Parody, MD  oxyCODONE-acetaminophen (PERCOCET) 10-325 MG per tablet Take 1 tablet by mouth 4 (four) times daily as needed for pain.    Yes Historical Provider, MD  pantoprazole (PROTONIX) 40 MG tablet Take 40 mg by mouth daily.   Yes Historical Provider, MD  UNKNOWN TO PATIENT Take 1 tablet by mouth daily. CHOLESTEROL MEDICATION-NAME IS UNKNOWN   Yes Historical Provider, MD    Allergies:   Allergies  Allergen Reactions  . Prednisone Other (See Comments)    Chest pain    Social History:  reports that she has been smoking Cigarettes.  She has a 25 pack-year smoking history. She has never used smokeless tobacco. She reports that she does not drink alcohol or use illicit drugs.  Family History: Family History  Problem Relation Age of Onset  . Cancer Mother   . Cancer Father     Physical Exam: Filed Vitals:   08/25/14 1900 08/25/14 1930 08/25/14 2000 08/25/14 2105  BP: 127/63 122/74 138/75 162/75  Pulse: 70 69 72 70  Temp:    98.2 F (36.8 C)  TempSrc:    Oral  Resp: 12 12 22 22   Height:      Weight:    67.495 kg (148 lb 12.8 oz)  SpO2: 96% 93% 96% 96%   General appearance: alert, cooperative and no distress Head: Normocephalic, without obvious abnormality, atraumatic Eyes: negative Nose: Nares normal. Septum midline. Mucosa normal. No drainage or sinus tenderness. Neck: no JVD and supple, symmetrical, trachea midline Lungs: rales bibasilar no wheezes, no rhonchi, no crackles  Good air movement Heart: regular rate and rhythm, S1, S2 normal, no murmur, click, rub or gallop Abdomen: soft, non-tender; bowel sounds normal; no masses,  no organomegaly Extremities: extremities normal, atraumatic, no cyanosis or edema Pulses: 2+ and symmetric Skin: Skin color, texture, turgor normal. No rashes or lesions Neurologic: Grossly normal   Labs on Admission:   Recent Labs  08/25/14 1710  NA  140  K 4.4  CL 108  CO2 27  GLUCOSE 131*  BUN 17  CREATININE 1.10*  CALCIUM 9.5    Recent Labs  08/25/14 1710  AST 17  ALT 9*  ALKPHOS 70  BILITOT 0.4  PROT 7.1  ALBUMIN 3.7    Recent Labs  08/25/14 1710  WBC 6.7  NEUTROABS 3.9  HGB 6.8*  HCT 27.3*  MCV 74.6*  PLT 175    Recent Labs  08/25/14 1710  TROPONINI 0.03    Recent Labs  08/25/14 1710  RETICCTPCT 2.3    Radiological Exams on Admission: Dg Chest 1 View  08/25/2014   CLINICAL DATA:  Patient sent from Dr Vickey Sages office for complaints of weakness worsening over 2 weeks. States "I think it's because of these blood thinners. I've been on them about three months and  I just keep getting weaker and weaker." Pt unable to tolerate any repeats  EXAM: CHEST  1 VIEW  COMPARISON:  08/02/2013.  FINDINGS: Mild enlargement of the cardiopericardial silhouette. No mediastinal or hilar masses or evidence of adenopathy.  Clear lungs.  No pleural effusion or pneumothorax.  Left anterior chest wall sequential pacemaker is stable.  Bony thorax is grossly intact.  IMPRESSION: No acute cardiopulmonary disease   Electronically Signed   By: Lajean Manes M.D.   On: 08/25/2014 17:41   cxr and 12 lead ekg reviewed by myself Old records reviewed by myself  Assessment/Plan  69 yo female with worsening generalized weakness and dyspnea with worsening anemia and uti  Principal Problem:   Symptomatic anemia-  hgb has trended down to 6.8 from 10 about a year ago.  No overt bleeding.  Anemia panel is pending.  Microcytosis present, will need GI work up at some point , her heme occult is negative in the ED today but could be having chronic gib.  Pt denies any melena or brbpr however.  Transfuse 2 units prbc and order a dose of iv lasix 40mg  in between units.  Check orthostatics.  Hold eloquis and aspirin products.  Monitor closely for any overt bleeding.  Active Problems:   Acute respiratory failure with hypoxia-  This may be  multifactorial, and have a chronic component.  Provide oxygen 2 liters [place].  Lung exam with rales, will also check 2 D echo.   UTI (urinary tract infection)-  Iv rocephin q 24 hours ordered.  Urine culture ordered.   Type 2 diabetes mellitus-  ssi   Cardiac pacemaker in situ-  noted   COPD (chronic obstructive pulmonary disease)-  Compensated at this time.  Continue her home albuterol treatments.   Paroxysmal atrial fibrillation-  Noted, LBBB nsr now, anticoagulants held due to above issue until figure out cause of her anemia, reevaluate need to place back on for stroke prevention once underlying gib ruled out.   Chronic anticoagulation-  As above   LBBB (left bundle branch block)-  Old, noted   Weakness generalized-  As above   SOB (shortness of breath)-  As above, bnp is also elevated, check 2 D echo, no chf on chart, but on prn lasix.  Query ?chf component   Muscular dystrophy- noted   Microcytic anemia-  As above   Sleep apnea-  Continue her cpap qhs  Admit to telemetry bed.  FULL CODE.  pcp is dr Karie Kirks.  Carson Meche A 08/25/2014, 9:27 PM

## 2014-08-25 NOTE — ED Notes (Signed)
Report given to [facility] on Dept 300, all questions answered.

## 2014-08-25 NOTE — ED Notes (Signed)
Room air sat 83%.  Placed on 2 liters [place].

## 2014-08-25 NOTE — ED Provider Notes (Signed)
CSN: [id number]     Arrival date & time 08/25/14  1612 History   First MD Initiated Contact with Patient 08/25/14 1648     Chief Complaint  Patient presents with  . Weakness     (Consider location/radiation/quality/duration/timing/severity/associated sxs/prior Treatment) HPI Comments: Patient referred to the emergency department by her primary care physician. She was seen today for routine follow-up and was found to be very weak and short of breath. Patient reports that over the last 2 or 3 weeks he has had a progressive decline. She normally has significant weakness secondary to her muscular dystrophy, but in the last 2 weeks she has had even more weakness. She has noticed progressively worsening difficulty breathing. She has not had fever, chest pain, cough. Patient reports that her weakness has worsened ever since she was started on blood thinners prior cardiologist. She has not noticed any rectal bleeding. She does not know why she was placed on blood thinners.  Patient is a 69 y.o. female presenting with weakness.  Weakness Associated symptoms include shortness of breath.    Past Medical History  Diagnosis Date  . Sleep apnea     uses cpap  . Peripheral vascular disease   . GERD (gastroesophageal reflux disease)   . Neuromuscular disorder     HX of MD  . Diabetes   . Sinus node dysfunction   . H/O cardiac pacemaker   . Tobacco abuse   . Hyperlipidemia   . SSS (sick sinus syndrome)   . DDD (degenerative disc disease), lumbar    Past Surgical History  Procedure Laterality Date  . Insert / replace / remove pacemaker    . Cholecystectomy    . Abdominal hysterectomy    . Tonsillectomy    . Tubal ligation    . Femoral artery stent  02/18/2011  . Esophagogastroduodenoscopy  04/13/2002    A short segment of salmon-colored epithelium in the distal esophagus  consistent with Barrett's esophagus, biopsied/ The remainder of the esophageal mucosa, stomach, and duodenum through the  second portion appeared normal  . Colonoscopy  04/13/2002    Diminutive polyps in the rectum cold biopsied/removed/ The remainder of the rectal mucosa and colon appeared normal.  . Lower extremity angiogram N/A 02/18/2011    Procedure: LOWER EXTREMITY ANGIOGRAM;  Surgeon: Lorretta Harp, MD;  Location: [facility] CATH LAB;  Service: Cardiovascular;  Laterality: N/A;  . Lower extremity angiogram N/A 10/29/2012    Procedure: LOWER EXTREMITY ANGIOGRAM;  Surgeon: Lorretta Harp, MD;  Location: [facility] CATH LAB;  Service: Cardiovascular;  Laterality: N/A;  . Abdominal angiogram  10/29/2012    Procedure: ABDOMINAL ANGIOGRAM;  Surgeon: Lorretta Harp, MD;  Location: [facility] CATH LAB;  Service: Cardiovascular;;  . Implantable cardioverter defibrillator generator change Left 10/29/2013    Procedure: IMPLANTABLE CARDIOVERTER DEFIBRILLATOR GENERATOR CHANGE;  Surgeon: Sanda Klein, MD;  Location: [facility] CATH LAB;  Service: Cardiovascular;  Laterality: Left;  . Transthoracic echocardiogram  10/05/2011    EF 50-55% Pacemaker present,   . Transthoracic echocardiogram  05/20/2010    EF=>55%, pacer enduced LBBB, conduction abnormality  . Nm myocar perf wall motion  10/05/2011    Protocol:Lexiscan, mild perfusion defect due to pacing induced artifact  . Nm myocar perf wall motion  05/20/2010    Protocol:Persantine, EF78%, normal perfusion in all regions,    Family History  Problem Relation Age of Onset  . Cancer Mother   . Cancer Father    History  Substance Use Topics  . Smoking  status: Current Every Day Smoker -- 0.50 packs/day for 50 years    Types: Cigarettes    Last Attempt to Quit: 02/02/2013  . Smokeless tobacco: Never Used     Comment: "LIGHT THEM AND LET THEM BURN UP"  . Alcohol Use: No   OB History    No data available     Review of Systems  Respiratory: Positive for shortness of breath.   Neurological: Positive for weakness.  All other systems reviewed and are negative.     Allergies   Prednisone  Home Medications   Prior to Admission medications   Medication Sig Start Date End Date Taking? Authorizing Provider  albuterol (PROVENTIL) (2.5 MG/3ML) 0.083% nebulizer solution Take 2.5 mg by nebulization every 6 (six) hours as needed for wheezing or shortness of breath.     Historical Provider, MD  apixaban (ELIQUIS) 5 MG TABS tablet Take 1 tablet (5 mg total) by mouth 2 (two) times daily. 11/01/13   Mihai Croitoru, MD  furosemide (LASIX) 40 MG tablet Take 40 mg by mouth daily.     Historical Provider, MD  guaiFENesin (MUCINEX) 600 MG 12 hr tablet Take 1 tablet (600 mg total) by mouth 2 (two) times daily. 04/15/13   Nimish Luther Parody, MD  OVER THE COUNTER MEDICATION Take 2 tablets by mouth daily. [employer] brand stool softener    Historical Provider, MD  oxyCODONE-acetaminophen (PERCOCET) 10-325 MG per tablet Take 1 tablet by mouth every 6 (six) hours as needed for pain.     Historical Provider, MD  simvastatin (ZOCOR) 40 MG tablet Take 40 mg by mouth every evening.      Historical Provider, MD   BP 126/60 mmHg  Pulse 64  Temp(Src) 98.3 F (36.8 C) (Oral)  Resp 20  Ht 5\' 1"  (1.549 m)  Wt 140 lb (63.504 kg)  BMI 26.47 kg/m2  SpO2 91% Physical Exam  Constitutional: She is oriented to person, place, and time. She appears well-developed and well-nourished. No distress.  HENT:  Head: Normocephalic and atraumatic.  Right Ear: Hearing normal.  Left Ear: Hearing normal.  Nose: Nose normal.  Mouth/Throat: Oropharynx is clear and moist and mucous membranes are normal.  Eyes: Conjunctivae and EOM are normal. Pupils are equal, round, and reactive to light.  Neck: Normal range of motion. Neck supple.  Cardiovascular: Regular rhythm, S1 normal and S2 normal.  Exam reveals no gallop and no friction rub.   No murmur heard. Pulmonary/Chest: Effort normal. No respiratory distress. She has decreased breath sounds. She has rales. She exhibits no tenderness.  Abdominal: Soft. Normal  appearance and bowel sounds are normal. There is no hepatosplenomegaly. There is no tenderness. There is no rebound, no guarding, no tenderness at McBurney's point and negative Murphy's sign. No hernia.  Musculoskeletal: Normal range of motion.  Neurological: She is alert and oriented to person, place, and time. She has normal strength. No cranial nerve deficit or sensory deficit. Coordination normal. GCS eye subscore is 4. GCS verbal subscore is 5. GCS motor subscore is 6.  Skin: Skin is warm, dry and intact. No rash noted. No cyanosis.  Psychiatric: She has a normal mood and affect. Her speech is normal and behavior is normal. Thought content normal.  Nursing note and vitals reviewed.   ED Course  Procedures (including critical care time) Labs Review Labs Reviewed  CBC WITH DIFFERENTIAL/PLATELET  BASIC METABOLIC PANEL  URINALYSIS, ROUTINE W REFLEX MICROSCOPIC  HEPATIC FUNCTION PANEL  BRAIN NATRIURETIC PEPTIDE  TROPONIN I  I-STAT  CG4 LACTIC ACID, ED    Imaging Review No results found.   EKG Interpretation None      MDM   Final diagnoses:  Shortness of breath   anemia  UTI  Patient presented to the ER for evaluation of generalized weakness. She was referred here by her primary care doctor who saw her today. Patient has a history of muscular dystrophy. She is reporting shortness of breath and is noted to be somewhat hypoxic on room air. She has improved on supplemental oxygen. She did have diffuse rales on examination, but chest x-ray did not show any acute pathology. Suspect early mild congestive heart failure.  Patient also found to have urinary tract infection. Culture pending. Initiated on Rocephin. Patient's blood counts show significant anemia. Etiology of the anemia is unclear. Iron studies are pending. Patient had heme-negative stools performed by me here in the ER. Will need to be monitored, as she is on Eliquis.    Orpah Greek, MD 08/25/14 2322

## 2014-08-26 ENCOUNTER — Inpatient Hospital Stay (HOSPITAL_BASED_OUTPATIENT_CLINIC_OR_DEPARTMENT_OTHER): Payer: Medicare Other

## 2014-08-26 DIAGNOSIS — R0600 Dyspnea, unspecified: Secondary | ICD-10-CM

## 2014-08-26 DIAGNOSIS — R778 Other specified abnormalities of plasma proteins: Secondary | ICD-10-CM

## 2014-08-26 DIAGNOSIS — R7989 Other specified abnormal findings of blood chemistry: Secondary | ICD-10-CM

## 2014-08-26 LAB — GLUCOSE, CAPILLARY
GLUCOSE-CAPILLARY: 110 mg/dL — AB (ref 65–99)
GLUCOSE-CAPILLARY: 132 mg/dL — AB (ref 65–99)
GLUCOSE-CAPILLARY: 125 mg/dL — AB (ref 65–99)
Glucose-Capillary: 107 mg/dL — ABNORMAL HIGH (ref 65–99)

## 2014-08-26 LAB — BASIC METABOLIC PANEL
ANION GAP: 8 (ref 5–15)
BUN: 17 mg/dL (ref 6–20)
CO2: 31 mmol/L (ref 22–32)
Calcium: 9.7 mg/dL (ref 8.9–10.3)
Chloride: 104 mmol/L (ref 101–111)
Creatinine, Ser: 1.03 mg/dL — ABNORMAL HIGH (ref 0.44–1.00)
GFR calc Af Amer: >60 mL/min (ref >60)
GFR calc non Af Amer: 54 mL/min — ABNORMAL LOW (ref >60)
GLUCOSE: 107 mg/dL — AB (ref 65–99)
Potassium: 4.5 mmol/L (ref 3.5–5.1)
Sodium: 143 mmol/L (ref 135–145)

## 2014-08-26 LAB — TROPONIN I
TROPONIN I: 0.04 ng/mL — AB (ref <0.031)
Troponin I: 0.06 ng/mL — ABNORMAL HIGH (ref <0.031)
Troponin I: 0.04 ng/mL — ABNORMAL HIGH (ref <0.031)
Troponin I: 0.04 ng/mL — ABNORMAL HIGH (ref <0.031)

## 2014-08-26 LAB — CBC
HCT: 37.7 % (ref 36.0–46.0)
HEMOGLOBIN: 10.5 g/dL — AB (ref 12.0–15.0)
MCH: 21.7 pg — ABNORMAL LOW (ref 26.0–34.0)
MCHC: 27.9 g/dL — ABNORMAL LOW (ref 30.0–36.0)
MCV: 77.9 fL — AB (ref 78.0–100.0)
PLATELETS: 155 10*3/uL (ref 150–400)
RBC: 4.84 MIL/uL (ref 3.87–5.11)
RDW: 20.3 % — AB (ref 11.5–15.5)
WBC: 8 10*3/uL (ref 4.0–10.5)

## 2014-08-26 LAB — IRON AND TIBC
Iron: 13 ug/dL — ABNORMAL LOW (ref 28–170)
Saturation Ratios: 2 % — ABNORMAL LOW (ref 10.4–31.8)
TIBC: 525 ug/dL — AB (ref 250–450)
UIBC: 512 ug/dL

## 2014-08-26 LAB — FOLATE: Folate: 38.5 ng/mL (ref >5.9)

## 2014-08-26 LAB — FERRITIN: Ferritin: 3 ng/mL — ABNORMAL LOW (ref 11–307)

## 2014-08-26 LAB — OCCULT BLOOD, POC DEVICE: FECAL OCCULT BLD: NEGATIVE

## 2014-08-26 LAB — VITAMIN B12: Vitamin B-12: 224 pg/mL (ref 180–914)

## 2014-08-26 MED ORDER — DEXTROSE 5 % IV SOLN
1.0000 g | INTRAVENOUS | Status: DC
  Administered 2014-08-26: 1 g via INTRAVENOUS
  Filled 2014-08-26 (×2): qty 10

## 2014-08-26 NOTE — Progress Notes (Signed)
[facility] PROGRESS NOTE  [name] EHO:[id number] DOB: 02/18/1946 DOA: 08/25/2014 PCP: Robert Bellow, MD  Assessment/Plan: Symptomatic anemia -Hemoglobin on admission was 6.8. -No signs of bleeding. -Was transfused 2 units of PRBCs with hemoglobin increasing to 10.5. -Recommend GI workup as an outpatient as directed by her PCP.  Acute respiratory failure with hypoxemia -Resolved, oxygen requiring at present. -Likely secondary to severe symptomatic anemia.  UTI -Continue Rocephin pending culture data.  Paroxysmal atrial fibrillation -Anticoagulation is on hold due to symptomatic anemia. -Patient is extremely reluctant to restart anticoagulation at this point.  Chronic COPD -Compensated.  Type 2 diabetes mellitus -Well-controlled, continue current regimen.  Elevated troponin -Likely due to demand ischemia from severe, symptomatic anemia. -Minimal elevation up to 0.06. -2-D echo with ejection fraction of 50-55% not technically sufficient to evaluate diastolic function. -Denies chest pain. -We'll continue to cycle troponins, recheck EKG in the morning.  Code Status: Full code Family Communication: Patient only  Disposition Plan: Home likely in a.m. Please note that patient has refused PT evaluation   Consultants:  None   Antibiotics:  Ceftriaxone   Subjective: Denies current chest pain or shortness of breath, states that she continues to feel weak.  Objective: Filed Vitals:   08/26/14 0323 08/26/14 0343 08/26/14 0623 08/26/14 0700  BP: 151/66 120/98 148/78 128/70  Pulse: 70 70 67 69  Temp: 98.5 F (36.9 C) 97.6 F (36.4 C) 98.7 F (37.1 C) 97.9 F (36.6 C)  TempSrc: Oral Oral Oral Oral  Resp: 17 17 16 16   Height:      Weight:    66.633 kg (146 lb 14.4 oz)  SpO2:    100%    Intake/Output Summary (Last 24 hours) at 08/26/14 1553 Last data filed at 08/26/14 1234  Gross per 24 hour  Intake    910 ml  Output      0 ml  Net     910 ml   Filed Weights   08/25/14 1634 08/25/14 2105 08/26/14 0700  Weight: 63.504 kg (140 lb) 67.495 kg (148 lb 12.8 oz) 66.633 kg (146 lb 14.4 oz)    Exam:   General:  Alert, awake, oriented 3, no current distress  Cardiovascular: Regular rate and rhythm, no murmurs, rubs or gallops  Respiratory: Clear to auscultation bilaterally  Abdomen: Soft, nontender, nondistended, positive bowel sounds, no masses or organomegaly noted  Extremities: Trace bilateral pitting edema   Neurologic:  Grossly intact and nonfocal  Data Reviewed: Basic Metabolic Panel:  Recent Labs Lab 08/25/14 1710 08/26/14 0712  NA 140 143  K 4.4 4.5  CL 108 104  CO2 27 31  GLUCOSE 131* 107*  BUN 17 17  CREATININE 1.10* 1.03*  CALCIUM 9.5 9.7   Liver Function Tests:  Recent Labs Lab 08/25/14 1710  AST 17  ALT 9*  ALKPHOS 70  BILITOT 0.4  PROT 7.1  ALBUMIN 3.7   No results for input(s): LIPASE, AMYLASE in the last 168 hours. No results for input(s): AMMONIA in the last 168 hours. CBC:  Recent Labs Lab 08/25/14 1710 08/26/14 0712  WBC 6.7 8.0  NEUTROABS 3.9  --   HGB 6.8* 10.5*  HCT 27.3* 37.7  MCV 74.6* 77.9*  PLT 175 155   Cardiac Enzymes:  Recent Labs Lab 08/25/14 1710 08/25/14 2100 08/26/14 0712 08/26/14 1002  TROPONINI 0.03 0.03 0.04* 0.06*   BNP (last 3 results)  Recent Labs  08/25/14 1710  BNP 886.0*    ProBNP (last 3 results)  No results for input(s): PROBNP in the last 8760 hours.  CBG:  Recent Labs Lab 08/25/14 2154 08/26/14 0743 08/26/14 1159  GLUCAP 104* 110* 132*    No results found for this or any previous visit (from the past 240 hour(s)).   Studies: Dg Chest 1 View  08/25/2014   CLINICAL DATA:  Patient sent from Dr Vickey Sages office for complaints of weakness worsening over 2 weeks. States "I think it's because of these blood thinners. I've been on them about three months and I just keep getting weaker and weaker." Pt unable to tolerate  any repeats  EXAM: CHEST  1 VIEW  COMPARISON:  08/02/2013.  FINDINGS: Mild enlargement of the cardiopericardial silhouette. No mediastinal or hilar masses or evidence of adenopathy.  Clear lungs.  No pleural effusion or pneumothorax.  Left anterior chest wall sequential pacemaker is stable.  Bony thorax is grossly intact.  IMPRESSION: No acute cardiopulmonary disease   Electronically Signed   By: Lajean Manes M.D.   On: 08/25/2014 17:41    Scheduled Meds: . antiseptic oral rinse  7 mL Mouth Rinse BID  . cefTRIAXone (ROCEPHIN)  IV  1 g Intravenous Q24H  . guaiFENesin  600 mg Oral BID  . insulin aspart  0-9 Units Subcutaneous TID WC  . pantoprazole  40 mg Oral Daily  . sodium chloride  3 mL Intravenous Q12H  . sodium chloride  3 mL Intravenous Q12H   Continuous Infusions:   Principal Problem:   Symptomatic anemia Active Problems:   Type 2 diabetes mellitus   Cardiac pacemaker in situ   Acute respiratory failure with hypoxia   COPD (chronic obstructive pulmonary disease)   UTI (urinary tract infection)   Paroxysmal atrial fibrillation   Chronic anticoagulation   LBBB (left bundle branch block)   Weakness generalized   SOB (shortness of breath)   Muscular dystrophy   Microcytic anemia   Sleep apnea    Time spent: 30 minutes. Greater than 50% of this time was spent in direct contact with the patient coordinating care.    Lelon Frohlich  [facility] Pager [phone]  If 7PM-7AM, please contact night-coverage at [email or web address], password [facility] 08/26/2014, 3:53 PM  LOS: 1 day

## 2014-08-26 NOTE — Care Management Note (Signed)
Case Management Note  Patient Details  Name: VALORIA TAMBURRI MRN: [id number] Date of Birth: Mar 29, 1946  Subjective/Objective:                  Pt admitted from home with anemia and UTI. Pt lives with her sons and will return home at discharge. Pt stated that she has a w/c, walker, and cpap at home. She stated that she has someone to assist her with her bathing and dressing ADL's.  Action/Plan: Pt refuses any HH services at discharge. No furthur CM needs noted.  Expected Discharge Date:                  Expected Discharge Plan:  Home/Self Care  In-House Referral:  NA  Discharge planning Services  CM Consult  Post Acute Care Choice:  NA Choice offered to:  NA  DME Arranged:    DME Agency:     HH Arranged:    HH Agency:     Status of Service:  Completed, signed off  Medicare Important Message Given:    Date Medicare IM Given:    Medicare IM give by:    Date Additional Medicare IM Given:    Additional Medicare Important Message give by:     If discussed at [place] of Stay Meetings, dates discussed:    Additional Comments:  Joylene Draft, RN 08/26/2014, 1:32 PM

## 2014-08-26 NOTE — Progress Notes (Signed)
Patient troponin within normal limits at admission. Troponin elevated at 0.04 at 0712 and 0.06 at 1002. Dr. Jerilee Hoh notified.

## 2014-08-26 NOTE — Progress Notes (Signed)
UR chart review completed.  

## 2014-08-26 NOTE — Progress Notes (Signed)
Patient has a CPAP at home and wears it sometimes. She said that she needs a new hose and mask and has been wear less lately. Tonight she refused to wear the CPAP since she was getting blood but would be willing to wear the next night. Patient has been on 2lpm [place] and has done well with receiving blood and wearing her oxygen.

## 2014-08-26 NOTE — Progress Notes (Signed)
PT Cancellation Note  Patient Details Name: [name] MRN: [id number] DOB: 1945-05-17   Cancelled Treatment:    Reason Eval/Treat Not Completed: Patient declined, no reason specified.  Pt flat out refuses to even speak to me...she is very upset about a bill she received for HHPT in the past.  She just wants to go home!   Demetrios Isaacs L 08/26/2014, 1:21 PM

## 2014-08-27 DIAGNOSIS — N3000 Acute cystitis without hematuria: Secondary | ICD-10-CM

## 2014-08-27 DIAGNOSIS — J9601 Acute respiratory failure with hypoxia: Secondary | ICD-10-CM

## 2014-08-27 DIAGNOSIS — Z950 Presence of cardiac pacemaker: Secondary | ICD-10-CM

## 2014-08-27 DIAGNOSIS — Z7901 Long term (current) use of anticoagulants: Secondary | ICD-10-CM

## 2014-08-27 DIAGNOSIS — D649 Anemia, unspecified: Secondary | ICD-10-CM

## 2014-08-27 DIAGNOSIS — R7989 Other specified abnormal findings of blood chemistry: Secondary | ICD-10-CM

## 2014-08-27 LAB — GLUCOSE, CAPILLARY
GLUCOSE-CAPILLARY: 95 mg/dL (ref 65–99)
Glucose-Capillary: 104 mg/dL — ABNORMAL HIGH (ref 65–99)

## 2014-08-27 LAB — TYPE AND SCREEN
ABO/RH(D): A POS
Antibody Screen: NEGATIVE
UNIT DIVISION: 0
Unit division: 0

## 2014-08-27 LAB — TROPONIN I: TROPONIN I: 0.04 ng/mL — AB (ref <0.031)

## 2014-08-27 LAB — BASIC METABOLIC PANEL
Anion gap: 4 — ABNORMAL LOW (ref 5–15)
BUN: 20 mg/dL (ref 6–20)
CHLORIDE: 109 mmol/L (ref 101–111)
CO2: 30 mmol/L (ref 22–32)
Calcium: 9.4 mg/dL (ref 8.9–10.3)
Creatinine, Ser: 1.12 mg/dL — ABNORMAL HIGH (ref 0.44–1.00)
GFR calc Af Amer: 57 mL/min — ABNORMAL LOW (ref >60)
GFR calc non Af Amer: 49 mL/min — ABNORMAL LOW (ref >60)
Glucose, Bld: 96 mg/dL (ref 65–99)
Potassium: 4.3 mmol/L (ref 3.5–5.1)
SODIUM: 143 mmol/L (ref 135–145)

## 2014-08-27 LAB — CBC
HCT: 34 % — ABNORMAL LOW (ref 36.0–46.0)
Hemoglobin: 9.6 g/dL — ABNORMAL LOW (ref 12.0–15.0)
MCH: 21.9 pg — AB (ref 26.0–34.0)
MCHC: 28.2 g/dL — AB (ref 30.0–36.0)
MCV: 77.6 fL — ABNORMAL LOW (ref 78.0–100.0)
Platelets: 142 10*3/uL — ABNORMAL LOW (ref 150–400)
RBC: 4.38 MIL/uL (ref 3.87–5.11)
RDW: 21.2 % — ABNORMAL HIGH (ref 11.5–15.5)
WBC: 6.8 10*3/uL (ref 4.0–10.5)

## 2014-08-27 MED ORDER — CEFUROXIME AXETIL 500 MG PO TABS: 500.0000 mg | ORAL_TABLET | Freq: Two times a day (BID) | ORAL | Status: DC

## 2014-08-27 MED ORDER — FERROUS SULFATE 325 (65 FE) MG PO TBEC: 325.0000 mg | DELAYED_RELEASE_TABLET | Freq: Two times a day (BID) | ORAL | Status: DC

## 2014-08-27 NOTE — Discharge Summary (Signed)
Physician Discharge Summary  [name]:[id number] DOB: 1945/10/18 DOA: 08/25/2014  PCP: Robert Bellow, MD  Dr. Sallyanne Kuster.  Admit date: 08/25/2014 Discharge date: 08/27/2014  Recommendations for Outpatient Follow-up:  1. Symptomatic anemia, iron deficient. Suspect occult GI bleed, likely secondary to anticoagulation. Patient declined pursuing outpatient GI evaluation at this point and wants to speak to her physician about this. Consider outpatient GI evaluation. No evidence of ongoing bleeding. Responded well to blood. 2. Consider repeat CBC on follow-up. 3. Patient refuses anticoagulation at this point. 4. Urine culture pending. 5. Paced rhythm. See discussion below. Discussed with Sunday Spillers at Dr. Victorino December office who will call the patient for close follow-up.     Follow-up Information    Follow up with Robert Bellow, MD. Schedule an appointment as soon as possible for a visit in 1 week.   Specialty:  Family Medicine   Contact information:   [place] [address] [phone]      Discharge Diagnoses:  1. Symptomatic iron deficiency anemia 2. Acute hypoxic respiratory failure 3. Chronic diastolic congestive heart failure. No evidence of decompensation. 4. UTI. 5. Elevated troponin. 6. Generalized weakness. 7. Paroxysmal atrial fibrillation. 8. Dual-chamber pacemaker in situ  Discharge Condition: improved Disposition: home  Diet recommendation: heart healthy  Filed Weights   08/25/14 2105 08/26/14 0700 08/27/14 0700  Weight: 67.495 kg (148 lb 12.8 oz) 66.633 kg (146 lb 14.4 oz) 66.679 kg (147 lb)    History of present illness:  69 year old woman on eloquence for paroxysmal atrial fibrillation presented with 2 week history of progressive shortness of breath, generalized weakness. No history of bleeding. Evaluation revealed acute hypoxic respiratory failure, symptomatic anemia.   Hospital Course:  Ms. Coriz received 2 units packed  red blood cells with resolution of symptoms. She had no evidence of bleeding during this hospitalization and her hospitalization was uncomplicated. Given lack of ongoing bleeding, GI evaluation deferred to the outpatient setting. Individual issues as below.  1. Symptomatic anemia. Heme-negative in the emergency department. Status post transfusion 2 units packed red blood cells. Suspect occult GI bleed secondary to Eliquis. 2. Acute hypoxic respiratory failure, dyspnea. Likely secondary to anemia. Resolved. 3. Grade 1 diastolic dysfunction, chronic diastolic dysfunction, doesn't take Lasix, refuses it at home. Appears well compensated. 4. UTI. Treated with ceftriaxone. 5. Diabetes type 2, stable. 6. Generalized weakness, refuses PT. 7. Elevated troponin. Likely secondary to symptomatic anemia, demand ischemia. Flat. 2-D echocardiogram reassuring. No chest pain. No further evaluation suggested. 8. Paroxysmal atrial fibrillation, status post pacemaker placement. On Eliquis which was held secondary symptomatic anemia. Patient reluctant to restart anticoagulation. Possible abnormal pacemaker function. Completely asymptomatic. Appears to be predominantly dual-chamber paced. Discussed with Dr. Domenic Polite and Dr. Sallyanne Kuster who reviewed EKG. This is normal pacer function. He does recommend she have a device check at she has not had one in approximately 69 months. 9. COPD. Stable. 10. Muscular dystrophy 11. Obstructive sleep apnea on CPAP   F/u culture results  Start iron on discharge.  Procedures:  Transfusion 2 units packed red blood cells.  2-D echocardiogram Impressions:  - Mild LVH with LVEF approximately 50-55%. Septal dyssynergy noted - suggests IVCD or pacing. Indeterminate diastolic function. Mild to moderate left atrial enlargement. Mild tricuspid regurgitation with evidence of moderate pulmonary hypertension, PASP 46 mmHg.  Antibiotics:  Ceftriaxone 5/23 >> 5/24  Ceftin 5/25  >> 5/26  Discharge Instructions  Discharge Instructions    Diet - low sodium heart healthy    Complete by:  As directed      Diet Carb Modified    Complete by:  As directed      Discharge instructions    Complete by:  As directed   Call your physician or seek immediate medical attention for pain, shortness of breath, bleeding or worsening of condition.     Increase activity slowly    Complete by:  As directed           Current Discharge Medication List    START taking these medications   Details  cefUROXime (CEFTIN) 500 MG tablet Take 1 tablet (500 mg total) by mouth 2 (two) times daily with a meal. Qty: 2 tablet, Refills: 0    ferrous sulfate 325 (65 FE) MG EC tablet Take 1 tablet (325 mg total) by mouth 2 (two) times daily with a meal. This medication will turn stool dark and may cause constipation. You may need a stool softener. Qty: 60 tablet, Refills: 0      CONTINUE these medications which have NOT CHANGED   Details  albuterol (PROVENTIL) (2.5 MG/3ML) 0.083% nebulizer solution Take 2.5 mg by nebulization every 6 (six) hours as needed for wheezing or shortness of breath.     docusate sodium (COLACE) 100 MG capsule Take 100 mg by mouth daily.    guaiFENesin (MUCINEX) 600 MG 12 hr tablet Take 1 tablet (600 mg total) by mouth 2 (two) times daily. Qty: 30 tablet, Refills: 0    oxyCODONE-acetaminophen (PERCOCET) 10-325 MG per tablet Take 1 tablet by mouth 4 (four) times daily as needed for pain.     pantoprazole (PROTONIX) 40 MG tablet Take 40 mg by mouth daily.      STOP taking these medications     apixaban (ELIQUIS) 5 MG TABS tablet      furosemide (LASIX) 40 MG tablet      simvastatin (ZOCOR) 40 MG tablet        Allergies  Allergen Reactions  . Prednisone Other (See Comments)    Chest pain    The results of significant diagnostics from this hospitalization (including imaging, microbiology, ancillary and laboratory) are listed below for reference.     Significant Diagnostic Studies: Dg Chest 1 View  08/25/2014   CLINICAL DATA:  Patient sent from Dr Vickey Sages office for complaints of weakness worsening over 2 weeks. States "I think it's because of these blood thinners. I've been on them about three months and I just keep getting weaker and weaker." Pt unable to tolerate any repeats  EXAM: CHEST  1 VIEW  COMPARISON:  08/02/2013.  FINDINGS: Mild enlargement of the cardiopericardial silhouette. No mediastinal or hilar masses or evidence of adenopathy.  Clear lungs.  No pleural effusion or pneumothorax.  Left anterior chest wall sequential pacemaker is stable.  Bony thorax is grossly intact.  IMPRESSION: No acute cardiopulmonary disease   Electronically Signed   By: Lajean Manes M.D.   On: 08/25/2014 17:41    Microbiology: Recent Results (from the past 240 hour(s))  Urine culture     Status: None (Preliminary result)   Collection Time: 08/25/14  7:10 PM  Result Value Ref Range Status   Specimen Description URINE, CLEAN CATCH  Final   Special Requests NONE  Final   Colony Count   Final    >=100,000 COLONIES/ML Performed at [facility]    Culture   Final    PROTEUS MIRABILIS Performed at [facility]    Report Status PENDING  Incomplete  Labs: Basic Metabolic Panel:  Recent Labs Lab 08/25/14 1710 08/26/14 0712 08/27/14 0400  NA 140 143 143  K 4.4 4.5 4.3  CL 108 104 109  CO2 27 31 30   GLUCOSE 131* 107* 96  BUN 17 17 20   CREATININE 1.10* 1.03* 1.12*  CALCIUM 9.5 9.7 9.4   Liver Function Tests:  Recent Labs Lab 08/25/14 1710  AST 17  ALT 9*  ALKPHOS 70  BILITOT 0.4  PROT 7.1  ALBUMIN 3.7   CBC:  Recent Labs Lab 08/25/14 1710 08/26/14 0712 08/27/14 0400  WBC 6.7 8.0 6.8  NEUTROABS 3.9  --   --   HGB 6.8* 10.5* 9.6*  HCT 27.3* 37.7 34.0*  MCV 74.6* 77.9* 77.6*  PLT 175 155 142*   Cardiac Enzymes:  Recent Labs Lab 08/26/14 0712 08/26/14 1002 08/26/14 1600 08/26/14 2125  08/27/14 0400  TROPONINI 0.04* 0.06* 0.04* 0.04* 0.04*       Recent Labs  08/25/14 1710  BNP 886.0*    CBG:  Recent Labs Lab 08/26/14 1159 08/26/14 1639 08/26/14 2149 08/27/14 0729 08/27/14 1157  GLUCAP 132* 107* 125* 104* 95    Principal Problem:   Symptomatic anemia Active Problems:   Type 2 diabetes mellitus   Cardiac pacemaker in situ   Acute respiratory failure with hypoxia   COPD (chronic obstructive pulmonary disease)   UTI (urinary tract infection)   Paroxysmal atrial fibrillation   Chronic anticoagulation   LBBB (left bundle branch block)   Weakness generalized   SOB (shortness of breath)   Muscular dystrophy   Microcytic anemia   Sleep apnea   Elevated troponin   Time coordinating discharge: 35 minutes  Signed:  Murray Hodgkins, MD [facility] 08/27/2014, 3:20 PM

## 2014-08-27 NOTE — Progress Notes (Signed)
Patient states understanding of discharge instructions, prescriptions given 

## 2014-08-27 NOTE — Care Management Note (Signed)
Case Management Note  Patient Details  Name: [name] MRN: [id number] Date of Birth: Sep 23, 1945  Subjective/Objective:                    Action/Plan:   Expected Discharge Date:                  Expected Discharge Plan:  Home/Self Care  In-House Referral:  NA  Discharge planning Services  CM Consult  Post Acute Care Choice:  NA Choice offered to:  NA  DME Arranged:    DME Agency:     HH Arranged:    [place] Agency:     Status of Service:  Completed, signed off  Medicare Important Message Given:  N/A - LOS <3 / Initial given by admissions Date Medicare IM Given:    Medicare IM give by:    Date Additional Medicare IM Given:    Additional Medicare Important Message give by:     If discussed at [place] of Stay Meetings, dates discussed:    Additional Comments: Pt discharged home today. No CM needs noted. Pt does not qualify for home O2. Christinia Gully Jerome, RN 08/27/2014, 3:36 PM

## 2014-08-27 NOTE — Progress Notes (Signed)
PROGRESS NOTE  [name] KKX:[id number] DOB: June 15, 1945 DOA: 08/25/2014 PCP: Robert Bellow, MD  Summary: 69 year old woman on eloquence for paroxysmal atrial fibrillation presented with 2 week history of progressive shortness of breath, generalized weakness. No history of bleeding. Evaluation revealed acute hypoxic respiratory failure, symptomatic anemia.   Assessment/Plan: 1. Symptomatic anemia. Heme-negative in the emergency department. Status post transfusion 2 units packed red blood cells. Suspect occult GI bleed secondary to Eliquis. Much improved. 2. Acute hypoxic respiratory failure, dyspnea. Likely secondary to anemia. Resolved. 3. Grade 1 diastolic dysfunction, chronic diastolic dysfunction, doesn't take Lasix, refuses it at home. Appears well compensated. 4. UTI. Treated with ceftriaxone. 5. Diabetes type 2, stable. 6. Generalized weakness, refuses PT. 7. Elevated troponin. Likely secondary to symptomatic anemia, demand ischemia. Flat. 2-D echocardiogram reassuring. No chest pain. No further evaluation suggested. 8. Paroxysmal atrial fibrillation, status post pacemaker placement. On Eliquis which was held secondary symptomatic anemia. Patient reluctant to restart anticoagulation. 9. COPD. Stable. 10. Muscular dystrophy 11. Obstructive sleep apnea on CPAP   Doing well, Hgb stable. No bleeding.  Patient refuses anticoagulants, regardless would hold for now.  We discussed rec for GI f/u but she prefers to speak with Dr. Karie Kirks.  F/u culture results  Start iron on discharge.  Disposition Plan: Patient plans to return home, refuses even physical therapy evaluation  Murray Hodgkins, MD  [facility]  Pager [phone] If 7PM-7AM, please contact night-coverage at [email or web address], password [facility] 08/27/2014, 2:20 PM  LOS: 2 days   Consultants:    Procedures:  Transfusion 2 units packed red blood cells.  2-D echocardiogram Impressions:  - Mild LVH with  LVEF approximately 50-55%. Septal dyssynergy noted - suggests IVCD or pacing. Indeterminate diastolic function. Mild to moderate left atrial enlargement. Mild tricuspid regurgitation with evidence of moderate pulmonary hypertension, PASP 46 mmHg.  Antibiotics:  Ceftriaxone 5/23 >> 5/24  Ceftin 5/25 >> 5/26  HPI/Subjective: Breathing well, no chest pain. No complaints, eating well. Wants to go home.  Objective: Filed Vitals:   08/26/14 0623 08/26/14 0700 08/26/14 2300 08/27/14 0700  BP: 148/78 128/70 142/70 123/66  Pulse: 67 69 80 70  Temp: 98.7 F (37.1 C) 97.9 F (36.6 C) 98.8 F (37.1 C) 99.4 F (37.4 C)  TempSrc: Oral Oral Oral Oral  Resp: 16 16 17 16   Height:      Weight:  66.633 kg (146 lb 14.4 oz)  66.679 kg (147 lb)  SpO2:  100% 94% 96%    Intake/Output Summary (Last 24 hours) at 08/27/14 1420 Last data filed at 08/27/14 0543  Gross per 24 hour  Intake     50 ml  Output      0 ml  Net     50 ml     Filed Weights   08/25/14 2105 08/26/14 0700 08/27/14 0700  Weight: 67.495 kg (148 lb 12.8 oz) 66.633 kg (146 lb 14.4 oz) 66.679 kg (147 lb)    Exam:     Afebrile, vital signs stable. No hypoxia. General:  Appears calm and comfortable Eyes: PERRL, normal lids, irises & conjunctiva ENT: grossly normal hearing, lips & tongue Neck: no LAD, masses or thyromegaly Cardiovascular: RRR, no m/r/g. No LE edema. Telemetry: normal pacing atrial and ventricular, no arrhythmias or dysfunction noted. Respiratory: CTA bilaterally, no w/r/r. Normal respiratory effort. Abdomen: soft, ntnd Skin: no rash or induration seen on limited exam Musculoskeletal: grossly normal tone BUE/BLE Psychiatric: grossly normal mood and affect, speech fluent and appropriate Neurologic: grossly non-focal.  New data  reviewed:  Capillary blood sugars stable  Troponin 0.03 >> 0.03 >> 0.04 >> 0.06 >> 0.04 >> 0.04 >> 0.04  Hemoglobin 6.8 >> 10.5 >> 9.6  Anemia panel consistent with  iron deficiency.  Basic metabolic panel today unremarkable.  EKG paced rhythm, left bundle branch block.  Pertinent data since admission:  Chest x-ray no acute disease  Pending data:  Urine culture greater than 100,000 Proteus mirabilis  Scheduled Meds: . antiseptic oral rinse  7 mL Mouth Rinse BID  . cefTRIAXone (ROCEPHIN)  IV  1 g Intravenous Q24H  . guaiFENesin  600 mg Oral BID  . insulin aspart  0-9 Units Subcutaneous TID WC  . pantoprazole  40 mg Oral Daily  . sodium chloride  3 mL Intravenous Q12H  . sodium chloride  3 mL Intravenous Q12H   Continuous Infusions:   Principal Problem:   Symptomatic anemia Active Problems:   Type 2 diabetes mellitus   Cardiac pacemaker in situ   Acute respiratory failure with hypoxia   COPD (chronic obstructive pulmonary disease)   UTI (urinary tract infection)   Paroxysmal atrial fibrillation   Chronic anticoagulation   LBBB (left bundle branch block)   Weakness generalized   SOB (shortness of breath)   Muscular dystrophy   Microcytic anemia   Sleep apnea   Elevated troponin

## 2014-08-28 LAB — URINE CULTURE

## 2014-09-15 ENCOUNTER — Encounter: Payer: Self-pay | Admitting: Cardiovascular Disease

## 2014-09-15 ENCOUNTER — Ambulatory Visit (INDEPENDENT_AMBULATORY_CARE_PROVIDER_SITE_OTHER): Payer: Medicare Other | Admitting: Cardiovascular Disease

## 2014-09-15 VITALS — BP 129/80 | HR 70 | Ht 59.0 in | Wt 147.0 lb

## 2014-09-15 DIAGNOSIS — D509 Iron deficiency anemia, unspecified: Secondary | ICD-10-CM | POA: Diagnosis not present

## 2014-09-15 DIAGNOSIS — J438 Other emphysema: Secondary | ICD-10-CM

## 2014-09-15 DIAGNOSIS — I495 Sick sinus syndrome: Secondary | ICD-10-CM | POA: Diagnosis not present

## 2014-09-15 DIAGNOSIS — I480 Paroxysmal atrial fibrillation: Secondary | ICD-10-CM | POA: Diagnosis not present

## 2014-09-15 DIAGNOSIS — Z950 Presence of cardiac pacemaker: Secondary | ICD-10-CM

## 2014-09-15 NOTE — Patient Instructions (Signed)
You have been referred to Dr. Laural Golden for evaluation of Iron Deficiency Anemia.  Continue to not take Eliquis at this time.  Dr. Sallyanne Kuster recommends that you schedule a follow-up appointment in: 3 months.

## 2014-09-15 NOTE — Progress Notes (Signed)
Patient ID: [name], female   DOB: 01/11/1946, 69 y.o.   MRN: [id number]     Cardiology Office Note   Date:  09/15/2014   ID:  [name], DOB [birth year], MRN [id number]  PCP:  Robert Bellow, MD  Cardiologist:   Sanda Klein, MD   Chief Complaint  Patient presents with  . Hospitalization Follow-up    no chest discomfort, no pain or swelling of legs      History of Present Illness: [name] is a 69 y.o. female who presents for follow-up after recent hospitalization with symptomatic anemia. She presented on May 23 with a hemoglobin of 6.8 and received 2 units of packed red blood cell transfusion. Subsequently her hemoglobin has increased to approximately 10. She has microcytic erythrocyte indices labs were clearly consistent with iron deficiency. She is not on iron supplements. Stool Hemoccult was negative. GI workup has not yet been performed.  She started anticoagulation with apixaban roughly one year ago when her pacemaker recorded asymptomatic episodes of atrial fibrillation. In the last 11 months the overall burden of atrial fibrillation has been a roughly 0.1%. The last episode of atrial fibrillation occurred on 02/11/2014, lasted for roughly 2 hours in the average ventricular rate was well controlled at 86 bpm. She does not have a history of stroke or transient ischemic attack or other embolic events.  Interrogation of her pacemaker also shows roughly 98% atrial pacing and 16% ventricular pacing. Her current pacemaker generator was implanted in 2015 and has roughly 11.5 years expected longevity. Heart rate histogram is severely blunted but she is extremely sedentary and is actually in a wheelchair today.  She started smoking again, but is trying to "cut back again".. Has type 2 diabetes mellitus that does not require medications. She has treated hyperlipidemia on statin. Had a normal nuclear stress test in 2004 and has normal left ventricular systolic function by  echo in January 2015. 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 She received a dual-chamber Med[employer]onic permanent pacemaker in December 2006 for symptomatic sinus bradycardia, generator change out 2015. Bears a diagnosis of muscular dystrophy.   Past Medical History  Diagnosis Date  . Sleep apnea     uses cpap  . Peripheral vascular disease   . GERD (gastroesophageal reflux disease)   . Neuromuscular disorder     HX of MD  . Diabetes   . Sinus node dysfunction   . H/O cardiac pacemaker   . Tobacco abuse   . Hyperlipidemia   . SSS (sick sinus syndrome)   . DDD (degenerative disc disease), lumbar     Past Surgical History  Procedure Laterality Date  . Insert / replace / remove pacemaker    . Cholecystectomy    . Abdominal hysterectomy    . Tonsillectomy    . Tubal ligation    . Femoral artery stent  02/18/2011  . Esophagogastroduodenoscopy  04/13/2002    A short segment of salmon-colored epithelium in the distal esophagus  consistent with Barrett's esophagus, biopsied/ The remainder of the esophageal mucosa, stomach, and duodenum through the second portion appeared normal  . Colonoscopy  04/13/2002    Diminutive polyps in the rectum cold biopsied/removed/ The remainder of the rectal mucosa and colon appeared normal.  . Lower extremity angiogram N/A  02/18/2011    Procedure: LOWER EXTREMITY ANGIOGRAM;  Surgeon: Lorretta Harp, MD;  Location: [facility] CATH LAB;  Service: Cardiovascular;  Laterality: N/A;  . Lower extremity angiogram N/A 10/29/2012    Procedure: LOWER EXTREMITY ANGIOGRAM;  Surgeon: Lorretta Harp, MD;  Location: [facility] CATH LAB;  Service: Cardiovascular;  Laterality: N/A;  . Abdominal angiogram  10/29/2012    Procedure: ABDOMINAL  ANGIOGRAM;  Surgeon: Lorretta Harp, MD;  Location: [facility] CATH LAB;  Service: Cardiovascular;;  . Implantable cardioverter defibrillator generator change Left 10/29/2013    Procedure: IMPLANTABLE CARDIOVERTER DEFIBRILLATOR GENERATOR CHANGE;  Surgeon: Sanda Klein, MD;  Location: [facility] CATH LAB;  Service: Cardiovascular;  Laterality: Left;  . Transthoracic echocardiogram  10/05/2011    EF 50-55% Pacemaker present,   . Transthoracic echocardiogram  05/20/2010    EF=>55%, pacer enduced LBBB, conduction abnormality  . Nm myocar perf wall motion  10/05/2011    Protocol:Lexiscan, mild perfusion defect due to pacing induced artifact  . Nm myocar perf wall motion  05/20/2010    Protocol:Persantine, EF78%, normal perfusion in all regions,      Current Outpatient Prescriptions  Medication Sig Dispense Refill  . albuterol (PROVENTIL) (2.5 MG/3ML) 0.083% nebulizer solution Take 2.5 mg by nebulization every 6 (six) hours as needed for wheezing or shortness of breath.     . docusate sodium (COLACE) 100 MG capsule Take 100 mg by mouth daily.    . ferrous sulfate 325 (65 FE) MG EC tablet Take 1 tablet (325 mg total) by mouth 2 (two) times daily with a meal. This medication will turn stool dark and may cause constipation. You may need a stool softener. 60 tablet 0  . guaiFENesin (MUCINEX) 600 MG 12 hr tablet Take 1 tablet (600 mg total) by mouth 2 (two) times daily. 30 tablet 0  . oxyCODONE-acetaminophen (PERCOCET) 10-325 MG per tablet Take 1 tablet by mouth 4 (four) times daily as needed for pain.     . pantoprazole (PROTONIX) 40 MG tablet Take 40 mg by mouth daily.     No current facility-administered medications for this visit.    Allergies:   Prednisone    Social History:  The patient  reports that she has been smoking Cigarettes.  She has a 25 pack-year smoking history. She has never used smokeless tobacco. She reports that she does not drink alcohol or use illicit drugs.   Family History:  The  patient's family history includes Cancer in her father and mother.    ROS:  Please see the history of present illness.    Otherwise, review of systems positive for none.   All other systems are reviewed and negative.    PHYSICAL EXAM: VS:  BP 129/80 mmHg  Pulse 70  Ht 4\' 11"  (1.499 m)  Wt 66.679 kg (147 lb)  BMI 29.67 kg/m2 , BMI Body mass index is 29.67 kg/(m^2).  General: Alert, oriented x3, no distress Head: no evidence of trauma, PERRL, EOMI, no exophtalmos or lid lag, no myxedema, no xanthelasma; normal ears, nose and oropharynx Neck: normal jugular venous pulsations and no hepatojugular reflux; brisk carotid pulses without delay and no carotid bruits Chest: clear to auscultation, no signs of consolidation by percussion or palpation, normal fremitus, symmetrical and full respiratory excursions Cardiovascular: normal position and quality of the apical impulse, regular rhythm, normal first and second heart sounds, no murmurs, rubs or gallops Abdomen: no tenderness or distention, no masses by palpation, no abnormal pulsatility or arterial bruits, normal bowel sounds, no  hepatosplenomegaly Extremities: no clubbing, cyanosis or edema; 2+ radial, ulnar and brachial pulses bilaterally; 2+ right femoral, posterior tibial and dorsalis pedis pulses; 2+ left femoral, posterior tibial and dorsalis pedis pulses; no subclavian or femoral bruits Neurological: grossly nonfocal Psych: euthymic mood, full affect   EKG:  EKG is not ordered today.    Recent Labs: 08/25/2014: ALT 9*; B Natriuretic Peptide 886.0*; TSH 1.063 08/27/2014: BUN 20; Creatinine, Ser 1.12*; Hemoglobin 9.6*; Platelets 142*; Potassium 4.3; Sodium 143    Lipid Panel No results found for: CHOL, TRIG, HDL, CHOLHDL, VLDL, LDLCALC, LDLDIRECT    Wt Readings from Last 3 Encounters:  09/15/14 66.679 kg (147 lb)  08/27/14 66.679 kg (147 lb)  11/13/13 64.003 kg (141 lb 1.6 oz)       ASSESSMENT AND PLAN:  1. Iron deficiency  anemia, likely secondary to oral anticoagulation for paroxysmal atrial fibrillation. I have recommended that she temporarily interrupt the anticoagulate (last episode of atrial fibrillation was in November) while she undergoes GI workup with probable need for both upper and lower endoscopy. She would like to see Dr. Laural Golden in [place].  2. Recurrent paroxysmal atrial fibrillation, without any episodes in the last 7 months and without a history of stroke or TIA. Will temporarily interrupt anticoagulation, but in the long run should restart it once bleeding has been evaluated. CHADSVasc at least 68 (age, gender, vascular disease), 4 if one counts diet controlled DM.  3. HTN - well controlled  4. PAD - currently asymptomatic  5. Tobacco abuse - discussed quitting again.  6. Pacemaker, dual chamber - normal device function  Current medicines are reviewed at length with the patient today.  The patient does not have concerns regarding medicines.  The following changes have been made:  Stop Eliquis until GI workup is completed  Labs/ tests ordered today include:  Orders Placed This Encounter  Procedures  . Ambulatory referral to Gastroenterology    Patient Instructions  You have been referred to Dr. Laural Golden for evaluation of Iron Deficiency Anemia.  Continue to not take Eliquis at this time.  Dr. Sallyanne Kuster recommends that you schedule a follow-up appointment in: 3 months.        Mikael Spray, MD  09/15/2014 1:08 PM    Sanda Klein, MD, [facility] [phone] office [phone] pager

## 2014-09-16 LAB — CUP PACEART REMOTE DEVICE CHECK
Battery Impedance: 109 Ohm
Battery Remaining Longevity: 138 mo
Battery Voltage: 2.79 V
Brady Statistic AP VP Percent: 16 %
Brady Statistic AS VP Percent: 0 %
Lead Channel Impedance Value: 366 Ohm
Lead Channel Impedance Value: 788 Ohm
Lead Channel Pacing Threshold Amplitude: 0.625 V
Lead Channel Pacing Threshold Pulse Width: 0.4 ms
Lead Channel Setting Pacing Amplitude: 2 V
Lead Channel Setting Pacing Amplitude: 2.25 V
Lead Channel Setting Pacing Pulse Width: 0.4 ms
Lead Channel Setting Sensing Sensitivity: 4 mV
MDC IDC MSMT LEADCHNL RA PACING THRESHOLD AMPLITUDE: 1 V
MDC IDC MSMT LEADCHNL RA PACING THRESHOLD PULSEWIDTH: 0.4 ms
MDC IDC SESS DTM: 20160613150204
MDC IDC STAT BRADY AP VS PERCENT: 82 %
MDC IDC STAT BRADY AS VS PERCENT: 1 %

## 2014-09-29 ENCOUNTER — Ambulatory Visit (INDEPENDENT_AMBULATORY_CARE_PROVIDER_SITE_OTHER): Payer: Medicare Other | Admitting: Internal Medicine

## 2014-10-01 ENCOUNTER — Encounter: Payer: Self-pay | Admitting: Cardiovascular Disease

## 2014-10-31 LAB — CUP PACEART INCLINIC DEVICE CHECK
Battery Remaining Longevity: 138 mo
Brady Statistic AP VP Percent: 16 %
Brady Statistic AS VP Percent: 0 %
Brady Statistic AS VS Percent: 1 %
Lead Channel Impedance Value: 366 Ohm
Lead Channel Pacing Threshold Pulse Width: 0.4 ms
Lead Channel Pacing Threshold Pulse Width: 0.4 ms
Lead Channel Setting Pacing Amplitude: 2 V
Lead Channel Setting Pacing Amplitude: 2.25 V
Lead Channel Setting Pacing Pulse Width: 0.4 ms
MDC IDC MSMT BATTERY IMPEDANCE: 109 Ohm
MDC IDC MSMT BATTERY VOLTAGE: 2.79 V
MDC IDC MSMT LEADCHNL RA PACING THRESHOLD AMPLITUDE: 1 V
MDC IDC MSMT LEADCHNL RV IMPEDANCE VALUE: 788 Ohm
MDC IDC MSMT LEADCHNL RV PACING THRESHOLD AMPLITUDE: 0.625 V
MDC IDC SESS DTM: 20160613150204
MDC IDC SET LEADCHNL RV SENSING SENSITIVITY: 4 mV
MDC IDC STAT BRADY AP VS PERCENT: 82 %

## 2014-12-03 ENCOUNTER — Encounter (INDEPENDENT_AMBULATORY_CARE_PROVIDER_SITE_OTHER): Payer: Self-pay | Admitting: *Deleted

## 2014-12-17 ENCOUNTER — Encounter: Payer: Medicare Other | Admitting: Cardiovascular Disease

## 2015-01-05 ENCOUNTER — Ambulatory Visit (INDEPENDENT_AMBULATORY_CARE_PROVIDER_SITE_OTHER): Payer: Medicare Other | Admitting: Internal Medicine

## 2015-01-05 ENCOUNTER — Other Ambulatory Visit (INDEPENDENT_AMBULATORY_CARE_PROVIDER_SITE_OTHER): Payer: Self-pay | Admitting: *Deleted

## 2015-01-05 ENCOUNTER — Telehealth (INDEPENDENT_AMBULATORY_CARE_PROVIDER_SITE_OTHER): Payer: Self-pay | Admitting: *Deleted

## 2015-01-05 ENCOUNTER — Encounter (INDEPENDENT_AMBULATORY_CARE_PROVIDER_SITE_OTHER): Payer: Self-pay | Admitting: Internal Medicine

## 2015-01-05 VITALS — BP 96/68 | HR 65 | Temp 97.9°F | Resp 16 | Ht 61.0 in | Wt 150.0 lb

## 2015-01-05 DIAGNOSIS — Z1211 Encounter for screening for malignant neoplasm of colon: Secondary | ICD-10-CM

## 2015-01-05 DIAGNOSIS — R1314 Dysphagia, pharyngoesophageal phase: Secondary | ICD-10-CM

## 2015-01-05 DIAGNOSIS — D509 Iron deficiency anemia, unspecified: Secondary | ICD-10-CM

## 2015-01-05 DIAGNOSIS — R1319 Other dysphagia: Secondary | ICD-10-CM

## 2015-01-05 DIAGNOSIS — R1310 Dysphagia, unspecified: Secondary | ICD-10-CM

## 2015-01-05 NOTE — Progress Notes (Signed)
Reason for consultation:  Iron deficiency anemia. Patient also complains of solid food dysphagia.  History of present illness:  Patient is 69 year old Caucasian female who is referred through courtesy of Dr. Karie Kirks for evaluation of iron deficiency anemia. Patient is accompanied by her son Clare Gandy today. Patient was admitted to [facility] back in May this year for progressive weakness and noted to have hemoglobin of 6.8 g and MCV of 74.6. Iron studies confirmed iron deficiency anemia. B12 and folate levels were normal. She also had one Hemoccults and was negative. Predischarge hemoglobin was 9.6. Patient was begun on iron which she took for several weeks and then stopped it. She denies melena or rectal bleeding chronic diarrhea vaginal bleeding or hematuria. She complains of urinary incontinence but no dysuria. She states she has had heartburn for several years. She was given prescription for pantoprazole but she is not taking it. She says she has changed her eating habits and not having heartburn like she used to. She does complain of intermittent solid food dysphagia. She states she is able to push food bolus down by drinking sips of water. She denies abdominal pain. She also denies history of peptic ulcer disease. She states she had colonoscopy by Dr. Gala Romney in 2004 with removal of single polyp. She also had EGD at the time and there was question of short segment Barrett's esophagus and biopsies were performed. I op see results are not available.  Colonoscopy was attempted by Dr. Rowe Pavy of [facility] 10 years ago and was in complete exam and was followed by barium enema. Patient says she has good appetite. She has gained a few pounds this year. He has history of myotonic muscular dystrophy and complains of generalized weakness. She is able to walk with help of walker. Her son states she has had 2 falling episodes recently. She also has chronic low back pain.   Current Medications: Outpatient  Encounter Prescriptions as of 01/05/2015  Medication Sig  . albuterol (PROVENTIL) (2.5 MG/3ML) 0.083% nebulizer solution Take 2.5 mg by nebulization every 6 (six) hours as needed for wheezing or shortness of breath.   . docusate sodium (COLACE) 100 MG capsule Take 100 mg by mouth daily.  Marland Kitchen guaiFENesin (MUCINEX) 600 MG 12 hr tablet Take 1 tablet (600 mg total) by mouth 2 (two) times daily.  . Multiple Vitamins-Minerals (CENTRUM SILVER ULTRA WOMENS) TABS Take by mouth daily.  Marland Kitchen oxyCODONE-acetaminophen (PERCOCET) 10-325 MG per tablet Take 1 tablet by mouth 4 (four) times daily as needed for pain.   . simvastatin (ZOCOR) 40 MG tablet Take 40 mg by mouth daily.  . [DISCONTINUED] ferrous sulfate 325 (65 FE) MG EC tablet Take 1 tablet (325 mg total) by mouth 2 (two) times daily with a meal. This medication will turn stool dark and may cause constipation. You may need a stool softener. (Patient not taking: Reported on 01/05/2015)  . [DISCONTINUED] pantoprazole (PROTONIX) 40 MG tablet Take 40 mg by mouth daily.   No facility-administered encounter medications on file as of 01/05/2015.   Past Medical History  Diagnosis Date  . Sleep apnea     uses cpap  . Peripheral vascular disease ([place])   . GERD (gastroesophageal reflux disease)   . Neuromuscular disorder ([place])     HX of MD  . Diabetes ([place])   . Sinus node dysfunction ([facility])   . H/O cardiac pacemaker   . Tobacco abuse   . Hyperlipidemia   . SSS (sick sinus syndrome) ([place])   .  DDD (degenerative disc disease), lumbar    Past Surgical History  Procedure Laterality Date  . Insert / replace / remove pacemaker    . Cholecystectomy     . Abdominal hysterectomy    . Tonsillectomy    . Tubal ligation    . Femoral artery stent  02/18/2011  . Esophagogastroduodenoscopy  04/13/2002    A short segment of salmon-colored epithelium in the distal esophagus  consistent with Barrett's esophagus, biopsied/ The remainder of the esophageal mucosa, stomach, and  duodenum through the second portion appeared normal  . Colonoscopy  04/13/2002    Diminutive polyps in the rectum cold biopsied/removed/ The remainder of the rectal mucosa and colon appeared normal.  . Lower extremity angiogram N/A 02/18/2011    Procedure: LOWER EXTREMITY ANGIOGRAM;  Surgeon: Lorretta Harp, MD;  Location: [facility] CATH LAB;  Service: Cardiovascular;  Laterality: N/A;  . Lower extremity angiogram N/A 10/29/2012    Procedure: LOWER EXTREMITY ANGIOGRAM;  Surgeon: Lorretta Harp, MD;  Location: [facility] CATH LAB;  Service: Cardiovascular;  Laterality: N/A;  . Abdominal angiogram  10/29/2012    Procedure: ABDOMINAL ANGIOGRAM;  Surgeon: Lorretta Harp, MD;  Location: [facility] CATH LAB;  Service: Cardiovascular;;  . Implantable cardioverter defibrillator generator change Left 10/29/2013    Procedure: IMPLANTABLE CARDIOVERTER DEFIBRILLATOR GENERATOR CHANGE;  Surgeon: Sanda Klein, MD;  Location: [facility] CATH LAB;  Service: Cardiovascular;  Laterality: Left;  . Transthoracic echocardiogram  10/05/2011    EF 50-55% Pacemaker present,   . Transthoracic echocardiogram  05/20/2010    EF=>55%, pacer enduced LBBB, conduction abnormality  . Nm myocar perf wall motion  10/05/2011    Protocol:Lexiscan, mild perfusion defect due to pacing induced artifact  . Nm myocar perf wall motion  05/20/2010    Protocol:Persantine, EF78%, normal perfusion in all regions,    Allergies  Allergen Reactions  . Prednisone Other (See Comments)    Chest pain   Family history; Father died of brain cancer within 7 months of diagnosis at age 85. Mother was diagnosed with non-Hodgkin's lymphoma at age 32 and died years later. One brother died at age 20 cause unknown. She has one half-brother and half-sister and they're doing fine.  Social history: She is widowed. Her husband died in 07/19/12. She has 3 children living. One daughter died at age 60 because of complications of myoclonic muscular dystrophy. She has 2 sons living  and they both have muscular dystrophy. Her daughter is in good health. She worked in [employer] for 25 years and has been on disability since Jul 20, 1991. She has not had any alcohol in 30 years but she smokes about a pack a day which she has done for 43 years.   Physical examination: Blood pressure 96/68, pulse 65, temperature 97.9 F (36.6 C), temperature source Oral, resp. rate 16, height 5\' 1"  (1.549 m), weight 150 lb (68.04 kg). Patient is alert and appears to be in no acute distress at appears chronically ill. She is sitting in a wheelchair. Conjunctiva is pink. Sclera is nonicteric Oropharyngeal mucosa is normal. No neck masses or thyromegaly noted. Cardiac exam with regular rhythm normal S1 and S2. No murmur or gallop noted. Lungs are clear to auscultation. Abdomen;  No LE edema or clubbing noted.  Labs/studies Results: lab data from 09/03/2014 WBC 7.2, H&H 10.1 and 35.8. MCV 79.4 and platelet count 201K.  Assessment:  #1. Iron deficiency anemia. No history of melena or rectal bleeding or diarrhea. One Hemoccults was negative. It appears she  has been on chronic PPI therapy and may have impaired iron absorption. She also needs to be checked for celiac disease. #2. Solid food dysphagia. Differential diagnosis includes esophageal motility disorder, esophageal stricture or ring. There was question of short segment Barrett's esophagus on her last EGD of January 2004 but these biopsy results not available.   Recommendations:  Hemoccult 1 EGD with ED and colonoscopy under monitored anesthesia care. She will have CBC and metabolic 7 prior to the procedure. Patient advised not to take by mouth iron until endoscopic evaluation completed.

## 2015-01-05 NOTE — Patient Instructions (Signed)
Hemoccults 1 Esophagogastroduodenoscopy with esophageal dilation and colonoscopy be scheduled. CBC and metabolic 7 to be done at the time of preop evaluation

## 2015-01-05 NOTE — Telephone Encounter (Signed)
Patient needs trilyte 

## 2015-01-06 MED ORDER — PEG 3350-KCL-NA BICARB-NACL 420 G PO SOLR: 4000.0000 mL | Freq: Once | ORAL | Status: DC

## 2015-01-08 ENCOUNTER — Telehealth (INDEPENDENT_AMBULATORY_CARE_PROVIDER_SITE_OTHER): Payer: Self-pay | Admitting: *Deleted

## 2015-01-08 NOTE — Telephone Encounter (Signed)
   Diagnosis:    Result(s)   Card 1:  Negative:          Completed by: Francisca Langenderfer,LPN   HEMOCCULT SENSA DEVELOPER: LOT#:  [id number] EXPIRATION DATE: 9-17   HEMOCCULT SENSA CARD:  LOT#:  [id number] EXPIRATION DATE: 07/18   CARD CONTROL RESULTS:  POSITIVE: Positive NEGATIVE: Negative    ADDITIONAL COMMENTS: Patient was called and made aware of results.

## 2015-01-09 NOTE — Telephone Encounter (Signed)
Stool is guaiac negative. 

## 2015-01-16 ENCOUNTER — Encounter (INDEPENDENT_AMBULATORY_CARE_PROVIDER_SITE_OTHER): Payer: Self-pay

## 2015-01-16 NOTE — Patient Instructions (Signed)
[name]  01/16/2015     @PREFPERIOPPHARMACY @   Your procedure is scheduled on  01/23/2015   Report to [facility] at  615  A.M.  Call this number if you have problems the morning of surgery:  [phone]   Remember:  Do not eat food or drink liquids after midnight.  Take these medicines the morning of surgery with A SIP OF WATER  percocet   Do not wear jewelry, make-up or nail polish.  Do not wear lotions, powders, or perfumes.  You may wear deodorant.  Do not shave 48 hours prior to surgery.  Men may shave face and neck.  Do not bring valuables to the hospital.  [facility] is not responsible for any belongings or valuables.  Contacts, dentures or bridgework may not be worn into surgery.  Leave your suitcase in the car.  After surgery it may be brought to your room.  For patients admitted to the hospital, discharge time will be determined by your treatment team.  Patients discharged the day of surgery will not be allowed to drive home.   Name and phone number of your driver:   family Special instructions:  Follow the diet and prep instructions given to you by Dr Olevia Perches office.  Please read over the following fact sheets that you were given. Pain Booklet, Coughing and Deep Breathing, Surgical Site Infection Prevention, Anesthesia Post-op Instructions and Care and Recovery After Surgery      Esophagogastrectomy Esophagogastrectomy is a surgery to remove a diseased portion of your esophagus. The upper portion of your stomach and some lymph nodes in the area are removed at the same time. In most cases, your stomach is then attached directly to the remaining portion of the esophagus. Esophagogastrectomy is often done as a treatment for cancer or precancerous conditions. The surgery may also be done to treat severe injuries to the esophagus. LET YOUR CAREGIVER KNOW ABOUT:   Allergies to food or medicine.  Medicines taken, including vitamins, herbs,  eyedrops, over-the-counter medicines, and creams.  Use of steroids (by mouth or creams).  Previous problems with anesthetics or numbing medicines.  History of bleeding problems or blood clots.  Previous surgery.  Other health problems, including diabetes and kidney problems.  Possibility of pregnancy, if this applies. RISKS AND COMPLICATIONS   Allergies to medicines used during the procedure.  Problems with breathing.  Bleeding.  Infection.  Damage to other structures near the esophagus and stomach.  Problems with swallowing. BEFORE THE PROCEDURE   Stop smoking if you smoke. Stopping will improve your health after surgery.  You may have blood tests to make sure your blood clots normally. Ask your caregiver about changing or stopping your regular medicines. You may be asked to stop taking blood thinners (anticoagulants) or nonsteroidal anti-inflammatory drugs (NSAIDs).  Do not eat or drink anything at least 8 hours before the surgery.  You and your caregiver will talk about the different surgical approaches. Together, you will agree on the surgical approach that is right for you. PROCEDURE   An intravenous (IV) access tube is put into one of your veins to give you fluids and medicines.  You will receive medicines to relax you (sedatives) and medicines that make you sleep (general anesthetic).  You may have a flexible tube (catheter) put into your bladder to drain urine.  You may have a tube put through your nose or mouth into your stomach (NG tube)  to remove digestive juices and prevent you from vomiting and feeling nauseous.  Surgical cuts (incisions) may be made in the throat, chest, or abdomen. Multiple smaller incisions may be made if the surgery can be done using a scope.  The part of the stomach to be removed will be stapled off and taken out.  The diseased length of esophagus will be removed, as well as lymph nodes in the area.  The remaining portion of the  stomach will be attached to the remaining portion of the esophagus.  All incisions will be closed with stitches (sutures), staples, or surgical glue. AFTER THE PROCEDURE   You will wake up in a recovery room, resting in bed until you have fully returned to consciousness.  You will have some pain. Pain medicines will be available to help you.  Your temperature, breathing rate, heart rate, blood pressure, and oxygen level (vital signs) will be monitored regularly.  You may be admitted to an intensive care unit (ICU) in the hospital for 1-2 days. There, you can be closely monitored.  The head of your bed will be kept at an upright angle.  You will likely have an NG tube that goes through your nose and into your stomach.  You may have a feeding tube. This tube will give you the proper nutrition until you can take food by mouth again.  You will likely have multiple tubes that are draining fluid from the incision.  You will have a catheter in your bladder.  You will continue to receive IV fluids.  You may have compression stockings on your legs to prevent blood clots.  You will be taught some breathing exercises. These will help your lungs recover from the anesthesia.  When you are more stable, you will be transferred to a regular hospital room.  You will probably be in the hospital for about 10-14 days. During this time, various drains, tubes, and monitors will slowly be discontinued when they are no longer needed. You will also be slowly eased into doing more activity and drinking and eating by mouth again.   This information is not intended to replace advice given to you by your health care provider. Make sure you discuss any questions you have with your health care provider.   Document Released: 09/20/2011 Document Reviewed: 09/20/2011 Elsevier Interactive Patient Education 2016 [employer] Esophageal Dilatation Esophageal dilatation is a procedure to open a blocked or narrowed  part of the esophagus. The esophagus is the long tube in your throat that carries food and liquid from your mouth to your stomach. The procedure is also called esophageal dilation.  You may need this procedure if you have a buildup of scar tissue in your esophagus that makes it difficult, painful, or even impossible to swallow. This can be caused by gastroesophageal reflux disease (GERD). In rare cases, people need this procedure because they have cancer of the esophagus or a problem with the way food moves through the esophagus. Sometimes you may need to have another dilatation to enlarge the opening of the esophagus gradually. LET [facility] CARE PROVIDER KNOW ABOUT:   Any allergies you have.  All medicines you are taking, including vitamins, herbs, eye drops, creams, and over-the-counter medicines.  Previous problems you or members of your family have had with the use of anesthetics.  Any blood disorders you have.  Previous surgeries you have had.  Medical conditions you have.  Any antibiotic medicines you are required to take before dental procedures. RISKS  AND COMPLICATIONS Generally, this is a safe procedure. However, problems can occur and include:  Bleeding from a tear in the lining of the esophagus.  A hole (perforation) in the esophagus. BEFORE THE PROCEDURE  Do not eat or drink anything after midnight on the night before the procedure or as directed by your health care provider.  Ask your health care provider about changing or stopping your regular medicines. This is especially important if you are taking diabetes medicines or blood thinners.  Plan to have someone take you home after the procedure. PROCEDURE   You will be given a medicine that makes you relaxed and sleepy (sedative).  A medicine may be sprayed or gargled to numb the back of the throat.  Your health care provider can use various instruments to do an esophageal dilatation. During the procedure, the  instrument used will be placed in your mouth and passed down into your esophagus. Options include:  Simple dilators. This instrument is carefully placed in the esophagus to stretch it.  Guided wire bougies. In this method, a flexible tube (endoscope) is used to insert a wire into the esophagus. The dilator is passed over this wire to enlarge the esophagus. Then the wire is removed.  Balloon dilators. An endoscope with a small balloon at the end is passed down into the esophagus. Inflating the balloon gently stretches the esophagus and opens it up. AFTER THE PROCEDURE  Your blood pressure, heart rate, breathing rate, and blood oxygen level will be monitored often until the medicines you were given have worn off.  Your throat may feel slightly sore and will probably still feel numb. This will improve slowly over time.  You will not be allowed to eat or drink until the throat numbness has resolved.  If this is a same-day procedure, you may be allowed to go home once you have been able to drink, urinate, and sit on the edge of the bed without nausea or dizziness.  If this is a same-day procedure, you should have a friend or family member with you for the next 24 hours after the procedure.   This information is not intended to replace advice given to you by your health care provider. Make sure you discuss any questions you have with your health care provider.   Document Released: 05/12/2005 Document Revised: 04/11/2014 Document Reviewed: 07/31/2013 Elsevier Interactive Patient Education 2016 [employer]. Colonoscopy A colonoscopy is an exam to look at the entire large intestine (colon). This exam can help find problems such as tumors, polyps, inflammation, and areas of bleeding. The exam takes about 1 hour.  LET [facility] CARE PROVIDER KNOW ABOUT:   Any allergies you have.  All medicines you are taking, including vitamins, herbs, eye drops, creams, and over-the-counter  medicines.  Previous problems you or members of your family have had with the use of anesthetics.  Any blood disorders you have.  Previous surgeries you have had.  Medical conditions you have. RISKS AND COMPLICATIONS  Generally, this is a safe procedure. However, as with any procedure, complications can occur. Possible complications include:  Bleeding.  Tearing or rupture of the colon wall.  Reaction to medicines given during the exam.  Infection (rare). BEFORE THE PROCEDURE   Ask your health care provider about changing or stopping your regular medicines.  You may be prescribed an oral bowel prep. This involves drinking a large amount of medicated liquid, starting the day before your procedure. The liquid will cause you to have multiple  loose stools until your stool is almost clear or light green. This cleans out your colon in preparation for the procedure.  Do not eat or drink anything else once you have started the bowel prep, unless your health care provider tells you it is safe to do so.  Arrange for someone to drive you home after the procedure. PROCEDURE   You will be given medicine to help you relax (sedative).  You will lie on your side with your knees bent.  A long, flexible tube with a light and camera on the end (colonoscope) will be inserted through the rectum and into the colon. The camera sends video back to a computer screen as it moves through the colon. The colonoscope also releases carbon dioxide gas to inflate the colon. This helps your health care provider see the area better.  During the exam, your health care provider may take a small tissue sample (biopsy) to be examined under a microscope if any abnormalities are found.  The exam is finished when the entire colon has been viewed. AFTER THE PROCEDURE   Do not drive for 24 hours after the exam.  You may have a small amount of blood in your stool.  You may pass moderate amounts of gas and have mild  abdominal cramping or bloating. This is caused by the gas used to inflate your colon during the exam.  Ask when your test results will be ready and how you will get your results. Make sure you get your test results.   This information is not intended to replace advice given to you by your health care provider. Make sure you discuss any questions you have with your health care provider.   Document Released: 03/18/2000 Document Revised: 01/09/2013 Document Reviewed: 11/26/2012 Elsevier Interactive Patient Education 2016 Elsevier Inc. PATIENT INSTRUCTIONS POST-ANESTHESIA  IMMEDIATELY FOLLOWING SURGERY:  Do not drive or operate machinery for the first twenty four hours after surgery.  Do not make any important decisions for twenty four hours after surgery or while taking narcotic pain medications or sedatives.  If you develop intractable nausea and vomiting or a severe headache please notify your doctor immediately.  FOLLOW-UP:  Please make an appointment with your surgeon as instructed. You do not need to follow up with anesthesia unless specifically instructed to do so.  WOUND CARE INSTRUCTIONS (if applicable):  Keep a dry clean dressing on the anesthesia/puncture wound site if there is drainage.  Once the wound has quit draining you may leave it open to air.  Generally you should leave the bandage intact for twenty four hours unless there is drainage.  If the epidural site drains for more than 36-48 hours please call the anesthesia department.  QUESTIONS?:  Please feel free to call your physician or the hospital operator if you have any questions, and they will be happy to assist you.

## 2015-01-20 ENCOUNTER — Encounter (HOSPITAL_COMMUNITY): Payer: Self-pay

## 2015-01-20 ENCOUNTER — Encounter (HOSPITAL_COMMUNITY)
Admission: RE | Admit: 2015-01-20 | Discharge: 2015-01-20 | Disposition: A | Discharge status: Home / Self Care | Payer: Medicare Other | Source: Ambulatory Visit | Attending: Internal Medicine | Admitting: Internal Medicine

## 2015-01-20 VITALS — Wt 150.0 lb

## 2015-01-20 DIAGNOSIS — G4730 Sleep apnea, unspecified: Secondary | ICD-10-CM | POA: Diagnosis not present

## 2015-01-20 DIAGNOSIS — D124 Benign neoplasm of descending colon: Secondary | ICD-10-CM | POA: Diagnosis not present

## 2015-01-20 DIAGNOSIS — R1310 Dysphagia, unspecified: Secondary | ICD-10-CM | POA: Diagnosis not present

## 2015-01-20 DIAGNOSIS — D123 Benign neoplasm of transverse colon: Secondary | ICD-10-CM | POA: Diagnosis not present

## 2015-01-20 DIAGNOSIS — J449 Chronic obstructive pulmonary disease, unspecified: Secondary | ICD-10-CM | POA: Diagnosis not present

## 2015-01-20 DIAGNOSIS — M1990 Unspecified osteoarthritis, unspecified site: Secondary | ICD-10-CM | POA: Diagnosis not present

## 2015-01-20 DIAGNOSIS — K5520 Angiodysplasia of colon without hemorrhage: Secondary | ICD-10-CM | POA: Diagnosis not present

## 2015-01-20 DIAGNOSIS — E119 Type 2 diabetes mellitus without complications: Secondary | ICD-10-CM | POA: Diagnosis not present

## 2015-01-20 DIAGNOSIS — D120 Benign neoplasm of cecum: Secondary | ICD-10-CM | POA: Diagnosis not present

## 2015-01-20 DIAGNOSIS — K649 Unspecified hemorrhoids: Secondary | ICD-10-CM | POA: Diagnosis not present

## 2015-01-20 DIAGNOSIS — K219 Gastro-esophageal reflux disease without esophagitis: Secondary | ICD-10-CM | POA: Diagnosis not present

## 2015-01-20 DIAGNOSIS — D509 Iron deficiency anemia, unspecified: Secondary | ICD-10-CM

## 2015-01-20 DIAGNOSIS — I739 Peripheral vascular disease, unspecified: Secondary | ICD-10-CM | POA: Diagnosis not present

## 2015-01-20 DIAGNOSIS — E785 Hyperlipidemia, unspecified: Secondary | ICD-10-CM | POA: Diagnosis not present

## 2015-01-20 DIAGNOSIS — I495 Sick sinus syndrome: Secondary | ICD-10-CM | POA: Diagnosis not present

## 2015-01-20 DIAGNOSIS — Z950 Presence of cardiac pacemaker: Secondary | ICD-10-CM | POA: Diagnosis not present

## 2015-01-20 DIAGNOSIS — K5730 Diverticulosis of large intestine without perforation or abscess without bleeding: Secondary | ICD-10-CM | POA: Diagnosis not present

## 2015-01-20 DIAGNOSIS — I4891 Unspecified atrial fibrillation: Secondary | ICD-10-CM | POA: Diagnosis not present

## 2015-01-20 DIAGNOSIS — F17210 Nicotine dependence, cigarettes, uncomplicated: Secondary | ICD-10-CM | POA: Diagnosis not present

## 2015-01-20 DIAGNOSIS — K2270 Barrett's esophagus without dysplasia: Secondary | ICD-10-CM | POA: Diagnosis not present

## 2015-01-20 LAB — BASIC METABOLIC PANEL
ANION GAP: 7 (ref 5–15)
BUN: 24 mg/dL — ABNORMAL HIGH (ref 6–20)
CO2: 23 mmol/L (ref 22–32)
Calcium: 10.1 mg/dL (ref 8.9–10.3)
Chloride: 111 mmol/L (ref 101–111)
Creatinine, Ser: 1.15 mg/dL — ABNORMAL HIGH (ref 0.44–1.00)
GFR, EST AFRICAN AMERICAN: 55 mL/min — AB (ref >60)
GFR, EST NON AFRICAN AMERICAN: 47 mL/min — AB (ref >60)
GLUCOSE: 99 mg/dL (ref 65–99)
POTASSIUM: 4.9 mmol/L (ref 3.5–5.1)
Sodium: 141 mmol/L (ref 135–145)

## 2015-01-20 LAB — CBC WITH DIFFERENTIAL/PLATELET
BASOS ABS: 0 10*3/uL (ref 0.0–0.1)
Basophils Relative: 0 %
Eosinophils Absolute: 0.3 10*3/uL (ref 0.0–0.7)
Eosinophils Relative: 4 %
HEMATOCRIT: 47.1 % — AB (ref 36.0–46.0)
HEMOGLOBIN: 14.6 g/dL (ref 12.0–15.0)
LYMPHS PCT: 36 %
Lymphs Abs: 2.5 10*3/uL (ref 0.7–4.0)
MCH: 28.7 pg (ref 26.0–34.0)
MCHC: 31 g/dL (ref 30.0–36.0)
MCV: 92.7 fL (ref 78.0–100.0)
Monocytes Absolute: 0.6 10*3/uL (ref 0.1–1.0)
Monocytes Relative: 8 %
NEUTROS ABS: 3.7 10*3/uL (ref 1.7–7.7)
NEUTROS PCT: 52 %
Platelets: 127 10*3/uL — ABNORMAL LOW (ref 150–400)
RBC: 5.08 MIL/uL (ref 3.87–5.11)
RDW: 18.1 % — ABNORMAL HIGH (ref 11.5–15.5)
WBC: 7 10*3/uL (ref 4.0–10.5)

## 2015-01-22 ENCOUNTER — Encounter: Payer: Self-pay | Admitting: Cardiovascular Disease

## 2015-01-22 NOTE — Progress Notes (Addendum)
Reviewing patients chart, trying to determine if pt has just a pacemaker or combo pacemaker defibrilllator, chart history unclear. Dr. Sallyanne Kuster office called and spoke with nurse. Chart revealed that the patient only had the generator changed for her pacemaker, no ICD documented in her records. [employer] Adapta [id number].

## 2015-01-22 NOTE — Telephone Encounter (Signed)
Erroneous encounter

## 2015-01-23 ENCOUNTER — Encounter (HOSPITAL_COMMUNITY): Payer: Self-pay | Admitting: *Deleted

## 2015-01-23 ENCOUNTER — Ambulatory Visit (HOSPITAL_COMMUNITY)
Admission: RE | Admit: 2015-01-23 | Discharge: 2015-01-23 | Disposition: A | Discharge status: Home / Self Care | Payer: Medicare Other | Source: Ambulatory Visit | Attending: Internal Medicine | Admitting: Internal Medicine

## 2015-01-23 ENCOUNTER — Encounter (HOSPITAL_COMMUNITY)
Admission: RE | Disposition: A | Discharge status: Home / Self Care | Payer: Self-pay | Source: Ambulatory Visit | Attending: Internal Medicine

## 2015-01-23 ENCOUNTER — Ambulatory Visit (HOSPITAL_COMMUNITY): Payer: Medicare Other | Admitting: Anesthesiology

## 2015-01-23 DIAGNOSIS — Z950 Presence of cardiac pacemaker: Secondary | ICD-10-CM | POA: Insufficient documentation

## 2015-01-23 DIAGNOSIS — D120 Benign neoplasm of cecum: Secondary | ICD-10-CM | POA: Insufficient documentation

## 2015-01-23 DIAGNOSIS — K219 Gastro-esophageal reflux disease without esophagitis: Secondary | ICD-10-CM | POA: Diagnosis not present

## 2015-01-23 DIAGNOSIS — D123 Benign neoplasm of transverse colon: Secondary | ICD-10-CM | POA: Diagnosis not present

## 2015-01-23 DIAGNOSIS — I495 Sick sinus syndrome: Secondary | ICD-10-CM | POA: Insufficient documentation

## 2015-01-23 DIAGNOSIS — K649 Unspecified hemorrhoids: Secondary | ICD-10-CM | POA: Insufficient documentation

## 2015-01-23 DIAGNOSIS — D509 Iron deficiency anemia, unspecified: Secondary | ICD-10-CM

## 2015-01-23 DIAGNOSIS — J449 Chronic obstructive pulmonary disease, unspecified: Secondary | ICD-10-CM | POA: Insufficient documentation

## 2015-01-23 DIAGNOSIS — G4730 Sleep apnea, unspecified: Secondary | ICD-10-CM | POA: Insufficient documentation

## 2015-01-23 DIAGNOSIS — K5520 Angiodysplasia of colon without hemorrhage: Secondary | ICD-10-CM | POA: Insufficient documentation

## 2015-01-23 DIAGNOSIS — M1990 Unspecified osteoarthritis, unspecified site: Secondary | ICD-10-CM | POA: Insufficient documentation

## 2015-01-23 DIAGNOSIS — E119 Type 2 diabetes mellitus without complications: Secondary | ICD-10-CM | POA: Insufficient documentation

## 2015-01-23 DIAGNOSIS — I4891 Unspecified atrial fibrillation: Secondary | ICD-10-CM | POA: Insufficient documentation

## 2015-01-23 DIAGNOSIS — K2270 Barrett's esophagus without dysplasia: Secondary | ICD-10-CM | POA: Insufficient documentation

## 2015-01-23 DIAGNOSIS — R1310 Dysphagia, unspecified: Secondary | ICD-10-CM

## 2015-01-23 DIAGNOSIS — D124 Benign neoplasm of descending colon: Secondary | ICD-10-CM | POA: Diagnosis not present

## 2015-01-23 DIAGNOSIS — F17210 Nicotine dependence, cigarettes, uncomplicated: Secondary | ICD-10-CM | POA: Insufficient documentation

## 2015-01-23 DIAGNOSIS — K449 Diaphragmatic hernia without obstruction or gangrene: Secondary | ICD-10-CM | POA: Diagnosis not present

## 2015-01-23 DIAGNOSIS — E785 Hyperlipidemia, unspecified: Secondary | ICD-10-CM | POA: Insufficient documentation

## 2015-01-23 DIAGNOSIS — K5730 Diverticulosis of large intestine without perforation or abscess without bleeding: Secondary | ICD-10-CM | POA: Insufficient documentation

## 2015-01-23 DIAGNOSIS — I739 Peripheral vascular disease, unspecified: Secondary | ICD-10-CM | POA: Insufficient documentation

## 2015-01-23 HISTORY — PX: COLONOSCOPY WITH PROPOFOL: SHX5780

## 2015-01-23 HISTORY — PX: ESOPHAGEAL DILATION: SHX303

## 2015-01-23 HISTORY — PX: BIOPSY: SHX5522

## 2015-01-23 HISTORY — PX: ESOPHAGOGASTRODUODENOSCOPY (EGD) WITH PROPOFOL: SHX5813

## 2015-01-23 HISTORY — PX: POLYPECTOMY: SHX5525

## 2015-01-23 LAB — GLUCOSE, CAPILLARY
GLUCOSE-CAPILLARY: 112 mg/dL — AB (ref 65–99)
Glucose-Capillary: 100 mg/dL — ABNORMAL HIGH (ref 65–99)

## 2015-01-23 SURGERY — COLONOSCOPY WITH PROPOFOL
Anesthesia: Monitor Anesthesia Care

## 2015-01-23 MED ORDER — PROPOFOL 500 MG/50ML IV EMUL
INTRAVENOUS | Status: DC | PRN
  Administered 2015-01-23: 45 ug/kg/min via INTRAVENOUS
  Administered 2015-01-23: 100 ug/kg/min via INTRAVENOUS

## 2015-01-23 MED ORDER — PROPOFOL 10 MG/ML IV BOLUS
INTRAVENOUS | Status: AC
  Filled 2015-01-23: qty 20

## 2015-01-23 MED ORDER — BUTAMBEN-TETRACAINE-BENZOCAINE 2-2-14 % EX AERO
2.0000 | INHALATION_SPRAY | Freq: Once | CUTANEOUS | Status: AC
  Administered 2015-01-23: 2 via TOPICAL
  Filled 2015-01-23: qty 20

## 2015-01-23 MED ORDER — LIDOCAINE HCL (PF) 1 % IJ SOLN
INTRAMUSCULAR | Status: AC
  Filled 2015-01-23: qty 5

## 2015-01-23 MED ORDER — FENTANYL CITRATE (PF) 100 MCG/2ML IJ SOLN: 25.0000 ug | INTRAMUSCULAR | Status: DC | PRN

## 2015-01-23 MED ORDER — PROPOFOL 10 MG/ML IV BOLUS
INTRAVENOUS | Status: DC | PRN
  Administered 2015-01-23 (×2): 13.6 mg via INTRAVENOUS

## 2015-01-23 MED ORDER — HYDROCORTISONE 2.5 % RE CREA: 1.0000 "application " | TOPICAL_CREAM | Freq: Two times a day (BID) | RECTAL | Status: DC

## 2015-01-23 MED ORDER — ONDANSETRON HCL 4 MG/2ML IJ SOLN: 4.0000 mg | Freq: Once | INTRAMUSCULAR | Status: DC | PRN

## 2015-01-23 MED ORDER — STERILE WATER FOR IRRIGATION IR SOLN
Status: DC | PRN
  Administered 2015-01-23: 1000 mL

## 2015-01-23 MED ORDER — LACTATED RINGERS IV SOLN
INTRAVENOUS | Status: DC
  Administered 2015-01-23: 07:00:00 via INTRAVENOUS

## 2015-01-23 MED ORDER — LIDOCAINE HCL (CARDIAC) 10 MG/ML IV SOLN
INTRAVENOUS | Status: DC | PRN
  Administered 2015-01-23: 25 mg via INTRAVENOUS

## 2015-01-23 MED ORDER — FERROUS SULFATE 325 (65 FE) MG PO TABS: 325.0000 mg | ORAL_TABLET | Freq: Every day | ORAL | Status: DC

## 2015-01-23 SURGICAL SUPPLY — 38 items
BALLN CRE LF 10-12 240X5.5 (BALLOONS)
BALLN CRE LF 10-12MM 240X5.5 (BALLOONS)
BALLN DILATOR CRE 12-15 240 (BALLOONS)
BALLN DILATOR CRE 15-18 240 (BALLOONS) IMPLANT
BALLN DILATOR CRE 18-20 240 (BALLOONS) IMPLANT
BALLN DILATOR CRE WIREGUIDE (BALLOONS)
BALLOON CRE LF 10-12 240X5.5 (BALLOONS) IMPLANT
BALLOON DILATOR CRE 12-15 240 (BALLOONS) IMPLANT
BALLOON DILATOR CRE WIREGUIDE (BALLOONS) IMPLANT
BLOCK BITE 60FR ADLT L/F BLUE (MISCELLANEOUS) ×2 IMPLANT
CLIP HMST11XOPN 235X2.8X (MISCELLANEOUS) IMPLANT
CLIP RESOLUTION 360 11X235 (MISCELLANEOUS) ×3
DEVICE CLIP HEMOSTAT 235CM (CLIP) ×4 IMPLANT
ELECT REM PT RETURN 9FT ADLT (ELECTROSURGICAL) ×3
ELECTRODE REM PT RTRN 9FT ADLT (ELECTROSURGICAL) IMPLANT
FCP BXJMBJMB 240X2.8X (CUTTING FORCEPS)
FLOOR PAD 36X40 (MISCELLANEOUS) ×3
FORCEP COLD BIOPSY (CUTTING FORCEPS) IMPLANT
FORCEPS BIOP RAD 4 LRG CAP 4 (CUTTING FORCEPS) ×4 IMPLANT
FORCEPS BIOP RJ4 240 W/NDL (CUTTING FORCEPS)
FORCEPS BXJMBJMB 240X2.8X (CUTTING FORCEPS) IMPLANT
FORMALIN 10 PREFIL 20ML (MISCELLANEOUS) ×6 IMPLANT
INJECTOR/SNARE I SNARE (MISCELLANEOUS) IMPLANT
KIT ENDO PROCEDURE PEN (KITS) ×3 IMPLANT
MANIFOLD NEPTUNE II (INSTRUMENTS) ×3 IMPLANT
NDL SCLEROTHERAPY 25GX240 (NEEDLE) IMPLANT
NEEDLE SCLEROTHERAPY 25GX240 (NEEDLE) IMPLANT
PAD FLOOR 36X40 (MISCELLANEOUS) IMPLANT
PROBE APC STR FIRE (PROBE) IMPLANT
PROBE INJECTION GOLD (MISCELLANEOUS)
PROBE INJECTION GOLD 7FR (MISCELLANEOUS) IMPLANT
SNARE ROTATE MED OVAL 20MM (MISCELLANEOUS) ×2 IMPLANT
SNARE SHORT THROW 13M SML OVAL (MISCELLANEOUS) ×1 IMPLANT
SYR INFLATE BILIARY GAUGE (MISCELLANEOUS) IMPLANT
SYR INFLATION 60ML (SYRINGE) IMPLANT
TRAP SPECIMEN MUCOUS 40CC (MISCELLANEOUS) ×4 IMPLANT
TUBING IRRIGATION ENDOGATOR (MISCELLANEOUS) ×2 IMPLANT
WATER STERILE IRR 1000ML POUR (IV SOLUTION) ×2 IMPLANT

## 2015-01-23 NOTE — Discharge Instructions (Signed)
No aspirin or NSAIDs for 1 week. Resume usual medications and diet. ProctoCream-HC 2.5% applied to anal canal twice daily for 1-2 weeks. Status sulfate 325 mg by mouth daily with breakfast. No driving for 24 hours. Physician will call with biopsy results.

## 2015-01-23 NOTE — Anesthesia Postprocedure Evaluation (Signed)
  Anesthesia Post-op Note  Patient: [name]  Procedure(s) Performed: Procedure(s) with comments: COLONOSCOPY WITH PROPOFOL (N/A) - Cecum time in 0814  time out  0837  total time 23 minutes ESOPHAGOGASTRODUODENOSCOPY (EGD) WITH PROPOFOL (N/A) - procedure 1 ESOPHAGEAL DILATION (N/A) - Maloney 54 BIOPSY (N/A) - duodenal and esophageal POLYPECTOMY (N/A) - cecal, hepatic flexure, distal transverse colon  Patient Location: PACU  Anesthesia Type:MAC  Level of Consciousness: awake, alert  and patient cooperative  Airway and Oxygen Therapy: Patient Spontanous Breathing  Post-op Pain: none  Post-op Assessment: Post-op Vital signs reviewed, Patient's Cardiovascular Status Stable and Respiratory Function Stable              Post-op Vital Signs: Reviewed and stable  Last Vitals:  Filed Vitals:   01/23/15 0720  BP: 129/72  Temp:   Resp: 14    Complications: No apparent anesthesia complications

## 2015-01-23 NOTE — Anesthesia Procedure Notes (Signed)
Procedure Name: MAC Date/Time: 01/23/2015 7:26 AM Performed by: Vista Deck Pre-anesthesia Checklist: Patient identified, Emergency Drugs available, Suction available, Timeout performed and Patient being monitored Patient Re-evaluated:Patient Re-evaluated prior to inductionOxygen Delivery Method: Non-rebreather mask

## 2015-01-23 NOTE — H&P (Signed)
[name] is an 69 y.o. female.   Chief Complaint: Patient's here for EGD, ED and colonoscopy. HPI: Patient is 70 year old Caucasian female with multiple medical problems who was discovered to have iron deficiency anemia in May this year when she was hospitalized with hemoglobin of 6.8 g. Her anemia has corrected with iron deficiency. There is no history of melena or rectal bleeding hematuria or vaginal bleeding. Patient was seen in the office earlier this month and Hemoccult was negative. She has chronic GERD. Heartburns well controlled with therapy. She also complains of solid food dysphagia. Issues last colonoscopy was at [facility] in [facility] 10 years ago and was in complete followed by barium enema.  Past Medical History  Diagnosis Date  . Sleep apnea     uses cpap  . Peripheral vascular disease ([place])   . GERD (gastroesophageal reflux disease)   . Neuromuscular disorder ([place])     HX of MD  . Diabetes ([place])   . Sinus node dysfunction ([facility])   . H/O cardiac pacemaker   . Tobacco abuse   . Hyperlipidemia   . SSS (sick sinus syndrome) ([place])   . DDD (degenerative disc disease), lumbar     Past Surgical History  Procedure Laterality Date  . Insert / replace / remove pacemaker    . Cholecystectomy    . Abdominal hysterectomy    . Tonsillectomy    . Tubal ligation    . Femoral artery stent  02/18/2011  . Esophagogastroduodenoscopy  04/13/2002    A short segment of salmon-colored epithelium in the distal esophagus  consistent with Barrett's esophagus, biopsied/ The remainder of the esophageal mucosa, stomach, and duodenum through the second portion appeared normal  . Colonoscopy  04/13/2002    Diminutive polyps in the rectum cold biopsied/removed/ The remainder of the rectal mucosa and colon appeared normal.  . Lower extremity angiogram N/A 02/18/2011    Procedure: LOWER EXTREMITY ANGIOGRAM;  Surgeon: Lorretta Harp, MD;  Location: [facility] C[facility] LAB;  Service: Cardiovascular;   Laterality: N/A;  . Lower extremity angiogram N/A 10/29/2012    Procedure: LOWER EXTREMITY ANGIOGRAM;  Surgeon: Lorretta Harp, MD;  Location: [facility] CATH LAB;  Service: Cardiovascular;  Laterality: N/A;  . Abdominal angiogram  10/29/2012    Procedure: ABDOMINAL ANGIOGRAM;  Surgeon: Lorretta Harp, MD;  Location: [facility] CATH LAB;  Service: Cardiovascular;;  . Implantable cardioverter defibrillator generator change Left 10/29/2013    Procedure: IMPLANTABLE CARDIOVERTER DEFIBRILLATOR GENERATOR CHANGE;  Surgeon: Sanda Klein, MD;  Location: [facility] CATH LAB;  Service: Cardiovascular;  Laterality: Left;  . Transthoracic echocardiogram  10/05/2011    EF 50-55% Pacemaker present,   . Transthoracic echocardiogram  05/20/2010    EF=>55%, pacer enduced LBBB, conduction abnormality  . Nm myocar perf wall motion  10/05/2011    Protocol:Lexiscan, mild perfusion defect due to pacing induced artifact  . Nm myocar perf wall motion  05/20/2010    Protocol:Persantine, EF78%, normal perfusion in all regions,     Family History  Problem Relation Age of Onset  . Cancer Mother   . Cancer Father    Social History:  reports that she has been smoking Cigarettes.  She has a 25 pack-year smoking history. She has never used smokeless tobacco. She reports that she does not drink alcohol or use illicit drugs.  Allergies:  Allergies  Allergen Reactions  . Prednisone Other (See Comments)    Chest pain    Medications Prior to Admission  Medication Sig Dispense  Refill  . albuterol (PROVENTIL) (2.5 MG/3ML) 0.083% nebulizer solution Take 2.5 mg by nebulization every 6 (six) hours as needed for wheezing or shortness of breath.     . docusate sodium (COLACE) 100 MG capsule Take 100 mg by mouth daily.    Marland Kitchen guaiFENesin (MUCINEX) 600 MG 12 hr tablet Take 1 tablet (600 mg total) by mouth 2 (two) times daily. 30 tablet 0  . Multiple Vitamins-Minerals (CENTRUM SILVER ULTRA WOMENS) TABS Take by mouth daily.    Marland Kitchen  oxyCODONE-acetaminophen (PERCOCET) 10-325 MG per tablet Take 1 tablet by mouth 4 (four) times daily as needed for pain.     . polyethylene glycol-electrolytes (NULYTELY/GOLYTELY) 420 G solution Take 4,000 mLs by mouth once. 4000 mL 0  . simvastatin (ZOCOR) 40 MG tablet Take 40 mg by mouth daily.      Results for orders placed or performed during the hospital encounter of 01/23/15 (from the past 48 hour(s))  Glucose, capillary     Status: Abnormal   Collection Time: 01/23/15  6:38 AM  Result Value Ref Range   Glucose-Capillary 112 (H) 65 - 99 mg/dL   No results found.  ROS  Temperature 98.4 F (36.9 C). Physical Exam  Constitutional: She appears well-developed and well-nourished.  HENT:  Mouth/Throat: Oropharynx is clear and moist.  Eyes: Conjunctivae are normal. No scleral icterus.  Neck: No thyromegaly present.  Cardiovascular: Normal rate and regular rhythm.   Murmur (grade 2/6 systolic murmur noted at left sternal border.) heard. Respiratory: Effort normal and breath sounds normal.  GI: Soft. She exhibits no distension and no mass. There is no tenderness.  Musculoskeletal: She exhibits no edema.  Lymphadenopathy:    She has no cervical adenopathy.  Neurological: She is alert.  Skin: Skin is warm and dry.     Assessment/Plan Iron deficiency anemia. Solid food dysphagia in patient with chronic GERD. EGD with ED and diagnostic colonoscopy.  Phyliss Hulick U 01/23/2015, 7:17 AM

## 2015-01-23 NOTE — Transfer of Care (Signed)
Immediate Anesthesia Transfer of Care Note  Patient: [name]  Procedure(s) Performed: Procedure(s) with comments: COLONOSCOPY WITH PROPOFOL (N/A) - Cecum time in 0814  time out  0837  total time 23 minutes ESOPHAGOGASTRODUODENOSCOPY (EGD) WITH PROPOFOL (N/A) - procedure 1 ESOPHAGEAL DILATION (N/A) - Maloney 54 BIOPSY (N/A) - duodenal and esophageal POLYPECTOMY (N/A) - cecal, hepatic flexure, distal transverse colon  Patient Location: PACU  Anesthesia Type:MAC  Level of Consciousness: awake and patient cooperative  Airway & Oxygen Therapy: Patient Spontanous Breathing and Patient connected to nasal cannula oxygen  Post-op Assessment: Report given to RN, Post -op Vital signs reviewed and stable and Patient moving all extremities  Post vital signs: Reviewed and stable    Complications: No apparent anesthesia complications

## 2015-01-23 NOTE — Op Note (Signed)
EGD PROCEDURE REPORT  PATIENT:  [name]  MR#:  [id number] Birthdate:  [birth year], 69 y.o., female Endoscopist:  Dr. Rogene Houston, MD Referred By:  Dr. Robert Bellow, MD  Procedure Date: 01/23/2015  Procedure:   EGD ED & Colonoscopy  Indications:  Patient is 69 year old Caucasian female with multiple medical problems who was found to have iron deficiency anemia in May this year when she was admitted with progressive weakness. Anemia has corrected with iron supplement. She has chronic GERD and is on PPI. She complains of solid food dysphagia. She denies melena or rectal bleeding in her stool was guaiac negative. Last colonoscopy was in 2006 at [facility] in [facility] and was incomplete.            Informed Consent:  The risks, benefits, alternatives & imponderables which include, but are not limited to, bleeding, infection, perforation, drug reaction and potential missed lesion have been reviewed.  The potential for biopsy, lesion removal, esophageal dilation, etc. have also been discussed.  Questions have been answered.  All parties agreeable.  Please see history & physical in medical record for more information.  Medications:  Monitored anesthesia care. Cetacaine spray topically for oropharyngeal anesthesia  EGD  Description of procedure:  The endoscope was introduced through the mouth and advanced to the second portion of the duodenum without difficulty or limitations. The mucosal surfaces were surveyed very carefully during advancement of the scope and upon withdrawal.  Findings:  Esophagus:  Mucosa of the proximal and middle segment was normal. Distally there was single patch of salmon colored mucosa about 15 mm tall along with thin circumferential rim of salmon colored mucosa. No ring or stricture noted. GEJ:  38 cm Hiatus:  41 cm Stomach:  Stomach was empty and distended very well with insufflation. Folds in the proximal stomach were normal. Examination of mucosa at  gastric body, antrum, pyloric channel, annulus fundus and cardia was normal. Duodenum:  Normal bulbar mucosa. Coarse appearance to post bulbar mucosa without erosions or ulcers.  Therapeutic/Diagnostic Maneuvers Performed:   Esophagus was dilated by passing 54 French Maloney dilator but no mucosal disruption induced. Multiple biopsies were taken from salmon colored mucosa at distal esophagus along with duodenal biopsies.  COLONOSCOPY Description of procedure:  After a digital rectal exam was performed, that colonoscope was advanced from the anus through the rectum and colon to the area of the cecum, ileocecal valve and appendiceal orifice. The cecum was deeply intubated. These structures were well-seen and photographed for the record. From the level of the cecum and ileocecal valve, the scope was slowly and cautiously withdrawn. The mucosal surfaces were carefully surveyed utilizing scope tip to flexion to facilitate fold flattening as needed. The scope was pulled down into the rectum where a thorough exam including retroflexion was performed.  Findings:   Prep excellent. 20 x 5 mm polyp snared piecemeal from cecum and two 360 clips applied to polypectomy site. 2 small polyps are cold snared and submitted together. These are located at hepatic flexure and transverse colon. 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 Few small diverticula at proximal sigmoid colon. Normal rectal mucosa. Small hemorrhoids below the dentate line.  Therapeutic/Diagnostic Maneuvers Performed:  See above  Complications:  None  EBL: Minimal  Cecal Withdrawal Time:  22  minutes  Impression:  EGD findings: Short circumferential patch of salmon colored mucosa with single tongue about 15 mm in length. Endoscopic appearance consistent with short segment Barrett's. Biopsies taken. Small sliding  hiatal hernia without ring or stricture formation. No evidence of peptic ulcer disease. Multiple biopsies  taken from salmon colored mucosa at distal esophagus. Multiple biopsies taken from post bulbar duodenal mucosa.  Colonoscopy findings: Examination performed to cecum. 20 x 6 mm cecal polyp snared piecemeal. Polypectomy complete. Two 360 clips applied to polypectomy site. Two small polyps are cold snared and submitted together. These are located at hepatic flexure and transverse colon. 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 Mild sigmoid colon diverticulosis. External hemorrhoids.   Recommendations:  Standard instructions given. No aspirin or NSAIDs for 1 week. ProctoCream-HC 2.5% to be applied to anal canal twice a day for 1-2 weeks. Ferrous sulfate 325 mg by mouth daily with breakfast. I will be contacting patient's son [name] with biopsy results and further recommendations.  Constantina Laseter U  01/23/2015 8:46 AM  CC: Dr. Robert Bellow, MD & Dr. Rayne Du ref. provider found

## 2015-01-23 NOTE — Anesthesia Preprocedure Evaluation (Addendum)
Anesthesia Evaluation  Patient identified by MRN, date of birth, ID band Patient awake    Reviewed: Allergy & Precautions, NPO status , Patient's Chart, lab work & pertinent test results  Airway Mallampati: II       Dental  (+) Edentulous Upper, Edentulous Lower, Upper Dentures, Lower Dentures   Pulmonary shortness of breath and with exertion, sleep apnea and Continuous Positive Airway Pressure Ventilation , pneumonia, resolved, COPD,  COPD inhaler, Current Smoker,     + decreased breath sounds      Cardiovascular Exercise Tolerance: Poor + Peripheral Vascular Disease  + dysrhythmias Atrial Fibrillation + pacemaker  Rhythm:Irregular     Neuro/Psych  Neuromuscular disease    GI/Hepatic GERD  ,  Endo/Other  diabetes, Poorly Controlled, Type 2, Oral Hypoglycemic Agents  Renal/GU      Musculoskeletal  (+) Arthritis , Osteoarthritis,    Abdominal Normal abdominal exam  (+)   Peds  Hematology  (+) anemia ,   Anesthesia Other Findings   Reproductive/Obstetrics                            Anesthesia Physical Anesthesia Plan  ASA: IV  Anesthesia Plan: MAC   Post-op Pain Management:    Induction: Intravenous  Airway Management Planned: Nasal Cannula  Additional Equipment:   Intra-op Plan:   Post-operative Plan:   Informed Consent: I have reviewed the patients History and Physical, chart, labs and discussed the procedure including the risks, benefits and alternatives for the proposed anesthesia with the patient or authorized representative who has indicated his/her understanding and acceptance.     Plan Discussed with: CRNA  Anesthesia Plan Comments:         Anesthesia Quick Evaluation

## 2015-01-26 ENCOUNTER — Encounter (HOSPITAL_COMMUNITY): Payer: Self-pay | Admitting: Internal Medicine

## 2015-01-27 ENCOUNTER — Telehealth (INDEPENDENT_AMBULATORY_CARE_PROVIDER_SITE_OTHER): Payer: Self-pay | Admitting: *Deleted

## 2015-01-27 NOTE — Telephone Encounter (Signed)
[name] is needing to talk with [name]. She is having problems bleeding. The return phone number is [phone].

## 2015-01-27 NOTE — Telephone Encounter (Signed)
Patient's call returned. Her bowels have moved twice since her colonoscopy 4 days ago and she noted fresh blood. She is not having abdominal pain. She is using ProctoCream that she was given for hemorrhoids. Suspect bleeding from second hemorrhoids. Will monitor course over the next 24 hours

## 2015-01-27 NOTE — Telephone Encounter (Signed)
I have talked with the patient. Since Sunday , when she has a BM she is passing bright red blood. She is not sure if it is a hemorrhoid or not. Call back number is [phone]. Patient uses [employer].

## 2015-01-27 NOTE — Telephone Encounter (Signed)
Forwarded to Dr.Rehman to address. 

## 2015-02-17 ENCOUNTER — Encounter (INDEPENDENT_AMBULATORY_CARE_PROVIDER_SITE_OTHER): Payer: Self-pay | Admitting: *Deleted

## 2015-03-03 ENCOUNTER — Ambulatory Visit (INDEPENDENT_AMBULATORY_CARE_PROVIDER_SITE_OTHER): Payer: Medicare Other | Admitting: Cardiovascular Disease

## 2015-03-03 ENCOUNTER — Encounter: Payer: Self-pay | Admitting: Cardiovascular Disease

## 2015-03-03 VITALS — BP 135/77 | HR 69 | Ht 61.0 in | Wt 160.0 lb

## 2015-03-03 DIAGNOSIS — Z950 Presence of cardiac pacemaker: Secondary | ICD-10-CM | POA: Diagnosis not present

## 2015-03-03 DIAGNOSIS — I495 Sick sinus syndrome: Secondary | ICD-10-CM | POA: Diagnosis not present

## 2015-03-03 DIAGNOSIS — I480 Paroxysmal atrial fibrillation: Secondary | ICD-10-CM

## 2015-03-03 DIAGNOSIS — Z7901 Long term (current) use of anticoagulants: Secondary | ICD-10-CM | POA: Diagnosis not present

## 2015-03-03 MED ORDER — ASPIRIN 81 MG PO TABS: 81.0000 mg | ORAL_TABLET | Freq: Every day | ORAL | Status: DC

## 2015-03-03 NOTE — Progress Notes (Signed)
Patient ID: [name], female   DOB: 1946-03-29, 69 y.o.   MRN: [id number]     Cardiology Office Note   Date:  03/03/2015   ID:  [name], DOB June 18, 1945, MRN [id number]  PCP:  Robert Bellow, MD  Cardiologist:   Sanda Klein, MD   Chief Complaint  Patient presents with  . Follow-up    no chest pain,no shortness of breath, has edema in right foot and ankle, no pain in legs, no cramping in legs, no lightheadedness, no dizziness      History of Present Illness: [name] is a 69 y.o. female who presents for  Sinus node dysfunction and pacemaker check, paroxysmal trial fibrillation, PAD.   she had problems with severe anemia over the last couple of years including need for transfusions. She has been off anticoagulation now for several months. She underwent endoscopy in October with Dr. Laural Golden. She had one large and 2 small colonic polyps and a small nonbleeding colonic AVM and rare sigmoid diverticuli. On EGD: short segment of Barrett's esophagus without evidence of ulcers or bleeding stigmata. Her hemoglobin is now up to 14.6. Hemoccult has been negative.   She still smokes although she claims that she has cut back to half a pack a day. She has a chronic cough which she blames on sinus problems.   Interrogation of her pacemaker shows normal device function. Her current generator was change out placed in July 2015 and is still at beginning of life. There is 99% atrial pacing and only 37% ventricular pacing. The burden of atrial fibrillation has been 0.1%, entirely made up by a three-hour episode of atrial fibrillation that occurred on June 25. Ventricular rate control was excellent during the episode, average 73 bpm  She has a history of paroxysmal atrial fibrillation. She has an extensive history of peripheral arterial disease: previous left superficial femoral artery angioplasty and stenting and chronic total occlusion of a previously placed stent in the right  superficial femoral artery, left for medical therapy because of the anticipated inability to recanalize with percutaneous means. She had a normal left ventricular ejection fraction by echo in January of 2015 and a normal nuclear stress test in 2004, but has not had recent assessment for ischemic heart disease. 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  She walks in her home with a walker, when she leaves the house she is in a wheelchair. She continues to smoke.   Past Medical History  Diagnosis Date  . Sleep apnea     uses cpap  . Peripheral vascular disease ([place])   . GERD (gastroesophageal reflux disease)   . Neuromuscular disorder ([place])     HX of MD  . Diabetes ([place])   . Sinus node dysfunction ([facility])   . H/O cardiac pacemaker   . Tobacco abuse   . Hyperlipidemia   . SSS (sick sinus syndrome) ([place])   . DDD (degenerative disc disease), lumbar     Past Surgical History  Procedure Laterality Date  . Insert / replace / remove pacemaker    . Cholecystectomy    . Abdominal hysterectomy    . Tonsillectomy    . Tubal ligation    . Femoral artery stent  02/18/2011  . Esophagogastroduodenoscopy  04/13/2002    A short segment of salmon-colored epithelium in the distal esophagus  consistent with Barrett's esophagus, biopsied/ The remainder of the esophageal mucosa, stomach, and duodenum through the second portion appeared normal  . Colonoscopy  04/13/2002  Diminutive polyps in the rectum cold biopsied/removed/ The remainder of the rectal mucosa and colon appeared normal.  . Lower extremity angiogram N/A 02/18/2011    Procedure: LOWER EXTREMITY ANGIOGRAM;  Surgeon: Lorretta Harp, MD;  Location: [facility] CATH LAB;  Service: Cardiovascular;  Laterality: N/A;  . Lower extremity angiogram N/A 10/29/2012    Procedure: LOWER EXTREMITY ANGIOGRAM;  Surgeon: Lorretta Harp, MD;  Location: [facility] CATH LAB;  Service: Cardiovascular;  Laterality: N/A;  . Abdominal angiogram  10/29/2012    Procedure:  ABDOMINAL ANGIOGRAM;  Surgeon: Lorretta Harp, MD;  Location: [facility] CATH LAB;  Service: Cardiovascular;;  . Implantable cardioverter defibrillator generator change Left 10/29/2013    Procedure: IMPLANTABLE CARDIOVERTER DEFIBRILLATOR GENERATOR CHANGE;  Surgeon: Sanda Klein, MD;  Location: [facility] CATH LAB;  Service: Cardiovascular;  Laterality: Left;  . Transthoracic echocardiogram  10/05/2011    EF 50-55% Pacemaker present,   . Transthoracic echocardiogram  05/20/2010    EF=>55%, pacer enduced LBBB, conduction abnormality  . Nm myocar perf wall motion  10/05/2011    Protocol:Lexiscan, mild perfusion defect due to pacing induced artifact  . Nm myocar perf wall motion  05/20/2010    Protocol:Persantine, EF78%, normal perfusion in all regions,   . Colonoscopy with propofol N/A 01/23/2015    Procedure: COLONOSCOPY WITH PROPOFOL;  Surgeon: Rogene Houston, MD;  Location: [facility] ORS;  Service: Endoscopy;  Laterality: N/A;  Cecum time in 0814  time out  0837  total time 23 minutes  . Esophagogastroduodenoscopy (egd) with propofol N/A 01/23/2015    Procedure: ESOPHAGOGASTRODUODENOSCOPY (EGD) WITH PROPOFOL;  Surgeon: Rogene Houston, MD;  Location: [facility] ORS;  Service: Endoscopy;  Laterality: N/A;  procedure 1  . Esophageal dilation N/A 01/23/2015    Procedure: ESOPHAGEAL DILATION;  Surgeon: Rogene Houston, MD;  Location: [facility] ORS;  Service: Endoscopy;  Laterality: N/A;  Maloney 54  . Esophageal biopsy N/A 01/23/2015    Procedure: BIOPSY;  Surgeon: Rogene Houston, MD;  Location: [facility] ORS;  Service: Endoscopy;  Laterality: N/A;  duodenal and esophageal  . Polypectomy N/A 01/23/2015    Procedure: POLYPECTOMY;  Surgeon: Rogene Houston, MD;  Location: [facility] ORS;  Service: Endoscopy;  Laterality: N/A;  cecal, hepatic flexure, distal transverse colon     Current Outpatient Prescriptions  Medication Sig Dispense Refill  . albuterol (PROVENTIL) (2.5 MG/3ML) 0.083% nebulizer solution Take 2.5 mg by nebulization every 6  (six) hours as needed for wheezing or shortness of breath.     . docusate sodium (COLACE) 100 MG capsule Take 100 mg by mouth daily.    Marland Kitchen guaiFENesin (MUCINEX) 600 MG 12 hr tablet Take 1 tablet (600 mg total) by mouth 2 (two) times daily. 30 tablet 0  . hydrocortisone (ANUSOL-HC) 2.5 % rectal cream Place 1 application rectally 2 (two) times daily. 30 g 0  . Multiple Vitamins-Minerals (CENTRUM SILVER ULTRA WOMENS) TABS Take by mouth daily.    Marland Kitchen oxyCODONE-acetaminophen (PERCOCET) 10-325 MG per tablet Take 1 tablet by mouth 4 (four) times daily as needed for pain.     Marland Kitchen aspirin 81 MG tablet Take 1 tablet (81 mg total) by mouth daily. 30 tablet    No current facility-administered medications for this visit.    Allergies:   Prednisone    Social History:  The patient  reports that she has been smoking Cigarettes.  She has a 25 pack-year smoking history. She has never used smokeless tobacco. She reports that she does not drink alcohol or use illicit  drugs.   Family History:  The patient's family history includes Cancer in her father and mother.    ROS:  Please see the history of present illness.    Otherwise, review of systems positive for  Sinus congestion and drainage.   All other systems are reviewed and negative.    PHYSICAL EXAM: VS:  BP 135/77 mmHg  Pulse 69  Ht 5\' 1"  (1.549 m)  Wt 160 lb (72.576 kg)  BMI 30.25 kg/m2 , BMI Body mass index is 30.25 kg/(m^2).  General: Alert, oriented x3, no distress Head: no evidence of trauma, PERRL, EOMI, no exophtalmos or lid lag, no myxedema, no xanthelasma; normal ears, nose and oropharynx Neck: normal jugular venous pulsations and no hepatojugular reflux; brisk carotid pulses without delay and no carotid bruits Chest: clear to auscultation, no signs of consolidation by percussion or palpation, normal fremitus, symmetrical and full respiratory excursions,  Healthy subclavian pacemaker site Cardiovascular: normal position and quality of the  apical impulse, regular rhythm, normal first and second heart sounds, no murmurs, rubs or gallops Abdomen: no tenderness or distention, no masses by palpation, no abnormal pulsatility or arterial bruits, normal bowel sounds, no hepatosplenomegaly Extremities: no clubbing, cyanosis or edema; 2+ radial, ulnar and brachial pulses bilaterally; 2+ right femoral, posterior tibial and dorsalis pedis pulses; 2+ left femoral, posterior tibial and dorsalis pedis pulses; no subclavian or femoral bruits Neurological: grossly nonfocal Psych: euthymic mood, full affect   EKG:  EKG is not ordered today.   Recent Labs: 08/25/2014: ALT 9*; B Natriuretic Peptide 886.0*; TSH 1.063 01/20/2015: BUN 24*; Creatinine, Ser 1.15*; Hemoglobin 14.6; Platelets 127*; Potassium 4.9; Sodium 141    Lipid Panel No results found for: CHOL, TRIG, HDL, CHOLHDL, VLDL, LDLCALC, LDLDIRECT    Wt Readings from Last 3 Encounters:  03/03/15 160 lb (72.576 kg)  01/20/15 150 lb (68.04 kg)  01/05/15 150 lb (68.04 kg)      Other studies Reviewed: Additional studies/ records that were reviewed today include:  Endoscopy reports.   ASSESSMENT AND PLAN:  1.  Recurrent paroxysmal atrial fibrillation. She is at least at moderate risk for cardioembolic stroke. CHADSVasc 4 ( age, gender, PAD, hypertension),  But she firmly and repeatedly rfuses to restart anticoagulation , since she is afraid of the risk of bleeding.  I discussed again the risk of embolic stroke and the fact that her recent endoscopic procedures have not shown findings that place her at high risk for bleeding. She would not change her mind. She will agree to take aspirin 81 mg daily.  2.  Sinus node dysfunction with normally functioning dual-chamber permanent pacemaker   3.  PAD currently asymptomatic, likely due to inactivity  4. Ongoing tobacco abuse. Advised her to quit smoking again. She has successfully quit smoking for lengthy periods of time in the  past.    Current medicines are reviewed at length with the patient today.  The patient does not have concerns regarding medicines.  The following changes have been made:   Start aspirin 81 mg daily  Labs/ tests ordered today include:  No orders of the defined types were placed in this encounter.    Patient Instructions  Your physician has recommended you make the following change in your medication: START ASPIRIN 81 MG DAILY  Remote monitoring is used to monitor your Pacemaker from home. This monitoring reduces the number of office visits required to check your device to one time per year. It allows [place] to monitor the functioning of your  device to ensure it is working properly. You are scheduled for a device check from home on June 03, 2014. You may send your transmission at any time that day. If you have a wireless device, the transmission will be sent automatically. After your physician reviews your transmission, you will receive a postcard with your next transmission date.  Dr. Sallyanne Kuster recommends that you schedule a follow-up appointment in: [place] ([place]).     Mikael Spray, MD  03/03/2015 3:35 PM    Sanda Klein, MD, [facility] [phone] office [phone] pager

## 2015-03-03 NOTE — Patient Instructions (Signed)
Your physician has recommended you make the following change in your medication: START ASPIRIN 81 MG DAILY  Remote monitoring is used to monitor your Pacemaker from home. This monitoring reduces the number of office visits required to check your device to one time per year. It allows [place] to monitor the functioning of your device to ensure it is working properly. You are scheduled for a device check from home on June 03, 2014. You may send your transmission at any time that day. If you have a wireless device, the transmission will be sent automatically. After your physician reviews your transmission, you will receive a postcard with your next transmission date.  Dr. Sallyanne Kuster recommends that you schedule a follow-up appointment in: [place] ([place]).

## 2015-03-12 LAB — CUP PACEART INCLINIC DEVICE CHECK
Battery Remaining Longevity: 140 mo
Brady Statistic AP VS Percent: 62 %
Brady Statistic AS VP Percent: 0 %
Brady Statistic AS VS Percent: 1 %
Implantable Lead Implant Date: 20061226
Implantable Lead Location: 753860
Lead Channel Impedance Value: 433 Ohm
Lead Channel Impedance Value: 791 Ohm
Lead Channel Setting Pacing Amplitude: 2 V
Lead Channel Setting Pacing Amplitude: 2.25 V
MDC IDC LEAD IMPLANT DT: 20061226
MDC IDC LEAD LOCATION: 753859
MDC IDC MSMT BATTERY IMPEDANCE: 109 Ohm
MDC IDC MSMT BATTERY VOLTAGE: 2.8 V
MDC IDC SESS DTM: 20161129153613
MDC IDC SET LEADCHNL RV PACING PULSEWIDTH: 0.4 ms
MDC IDC SET LEADCHNL RV SENSING SENSITIVITY: 4 mV
MDC IDC STAT BRADY AP VP PERCENT: 37 %

## 2015-04-09 ENCOUNTER — Other Ambulatory Visit: Payer: Self-pay | Admitting: Urology

## 2015-04-10 ENCOUNTER — Ambulatory Visit: Payer: Medicare Other | Admitting: Urology

## 2015-06-03 ENCOUNTER — Telehealth: Payer: Self-pay | Admitting: Cardiology

## 2015-06-03 ENCOUNTER — Encounter: Payer: Medicare Other | Admitting: *Deleted

## 2015-06-03 NOTE — Telephone Encounter (Signed)
LMOVM reminding pt to send remote transmission.   

## 2015-06-05 ENCOUNTER — Encounter: Payer: Self-pay | Admitting: Cardiology

## 2015-06-24 ENCOUNTER — Ambulatory Visit (INDEPENDENT_AMBULATORY_CARE_PROVIDER_SITE_OTHER): Payer: Medicare Other | Admitting: Urology

## 2015-06-24 DIAGNOSIS — N390 Urinary tract infection, site not specified: Secondary | ICD-10-CM

## 2015-06-24 DIAGNOSIS — R32 Unspecified urinary incontinence: Secondary | ICD-10-CM | POA: Diagnosis not present

## 2015-06-24 DIAGNOSIS — N3281 Overactive bladder: Secondary | ICD-10-CM

## 2015-07-09 ENCOUNTER — Encounter (HOSPITAL_COMMUNITY): Payer: Self-pay | Admitting: Emergency Medicine

## 2015-07-09 ENCOUNTER — Inpatient Hospital Stay (HOSPITAL_COMMUNITY): Payer: Medicare Other

## 2015-07-09 ENCOUNTER — Inpatient Hospital Stay (HOSPITAL_COMMUNITY)
Admission: EM | Admit: 2015-07-09 | Discharge: 2015-07-11 | DRG: 190 | Disposition: A | Discharge status: Home Health | Payer: Medicare Other | Attending: Internal Medicine | Admitting: Internal Medicine

## 2015-07-09 ENCOUNTER — Emergency Department (HOSPITAL_COMMUNITY): Payer: Medicare Other

## 2015-07-09 DIAGNOSIS — F17210 Nicotine dependence, cigarettes, uncomplicated: Secondary | ICD-10-CM | POA: Diagnosis present

## 2015-07-09 DIAGNOSIS — G7111 Myotonic muscular dystrophy: Secondary | ICD-10-CM | POA: Diagnosis present

## 2015-07-09 DIAGNOSIS — J9601 Acute respiratory failure with hypoxia: Secondary | ICD-10-CM | POA: Diagnosis present

## 2015-07-09 DIAGNOSIS — I480 Paroxysmal atrial fibrillation: Secondary | ICD-10-CM | POA: Diagnosis present

## 2015-07-09 DIAGNOSIS — J189 Pneumonia, unspecified organism: Secondary | ICD-10-CM | POA: Diagnosis present

## 2015-07-09 DIAGNOSIS — K5900 Constipation, unspecified: Secondary | ICD-10-CM | POA: Diagnosis present

## 2015-07-09 DIAGNOSIS — R059 Cough, unspecified: Secondary | ICD-10-CM

## 2015-07-09 DIAGNOSIS — R7989 Other specified abnormal findings of blood chemistry: Secondary | ICD-10-CM | POA: Diagnosis present

## 2015-07-09 DIAGNOSIS — G4730 Sleep apnea, unspecified: Secondary | ICD-10-CM | POA: Diagnosis present

## 2015-07-09 DIAGNOSIS — J440 Chronic obstructive pulmonary disease with acute lower respiratory infection: Secondary | ICD-10-CM | POA: Diagnosis not present

## 2015-07-09 DIAGNOSIS — J441 Chronic obstructive pulmonary disease with (acute) exacerbation: Secondary | ICD-10-CM | POA: Diagnosis present

## 2015-07-09 DIAGNOSIS — R778 Other specified abnormalities of plasma proteins: Secondary | ICD-10-CM | POA: Diagnosis present

## 2015-07-09 DIAGNOSIS — J438 Other emphysema: Secondary | ICD-10-CM

## 2015-07-09 DIAGNOSIS — T370X5A Adverse effect of sulfonamides, initial encounter: Secondary | ICD-10-CM | POA: Diagnosis present

## 2015-07-09 DIAGNOSIS — J9602 Acute respiratory failure with hypercapnia: Secondary | ICD-10-CM | POA: Diagnosis present

## 2015-07-09 DIAGNOSIS — E119 Type 2 diabetes mellitus without complications: Secondary | ICD-10-CM | POA: Diagnosis present

## 2015-07-09 DIAGNOSIS — E785 Hyperlipidemia, unspecified: Secondary | ICD-10-CM | POA: Diagnosis present

## 2015-07-09 DIAGNOSIS — J449 Chronic obstructive pulmonary disease, unspecified: Secondary | ICD-10-CM | POA: Diagnosis present

## 2015-07-09 DIAGNOSIS — E875 Hyperkalemia: Secondary | ICD-10-CM | POA: Diagnosis present

## 2015-07-09 DIAGNOSIS — Z79891 Long term (current) use of opiate analgesic: Secondary | ICD-10-CM

## 2015-07-09 DIAGNOSIS — Z950 Presence of cardiac pacemaker: Secondary | ICD-10-CM | POA: Diagnosis present

## 2015-07-09 DIAGNOSIS — K219 Gastro-esophageal reflux disease without esophagitis: Secondary | ICD-10-CM | POA: Diagnosis present

## 2015-07-09 DIAGNOSIS — Z7982 Long term (current) use of aspirin: Secondary | ICD-10-CM

## 2015-07-09 DIAGNOSIS — E860 Dehydration: Secondary | ICD-10-CM | POA: Diagnosis present

## 2015-07-09 DIAGNOSIS — I10 Essential (primary) hypertension: Secondary | ICD-10-CM | POA: Diagnosis present

## 2015-07-09 DIAGNOSIS — N179 Acute kidney failure, unspecified: Secondary | ICD-10-CM | POA: Diagnosis present

## 2015-07-09 DIAGNOSIS — I739 Peripheral vascular disease, unspecified: Secondary | ICD-10-CM | POA: Diagnosis present

## 2015-07-09 DIAGNOSIS — R05 Cough: Secondary | ICD-10-CM

## 2015-07-09 DIAGNOSIS — R0600 Dyspnea, unspecified: Secondary | ICD-10-CM | POA: Diagnosis present

## 2015-07-09 DIAGNOSIS — Z809 Family history of malignant neoplasm, unspecified: Secondary | ICD-10-CM

## 2015-07-09 DIAGNOSIS — R0602 Shortness of breath: Secondary | ICD-10-CM

## 2015-07-09 DIAGNOSIS — R0902 Hypoxemia: Secondary | ICD-10-CM

## 2015-07-09 LAB — COMPREHENSIVE METABOLIC PANEL
ALBUMIN: 3.6 g/dL (ref 3.5–5.0)
ALK PHOS: 80 U/L (ref 38–126)
ALT: 14 U/L (ref 14–54)
ANION GAP: 7 (ref 5–15)
AST: 19 U/L (ref 15–41)
BUN: 36 mg/dL — ABNORMAL HIGH (ref 6–20)
CALCIUM: 10 mg/dL (ref 8.9–10.3)
CO2: 25 mmol/L (ref 22–32)
Chloride: 106 mmol/L (ref 101–111)
Creatinine, Ser: 1.94 mg/dL — ABNORMAL HIGH (ref 0.44–1.00)
GFR calc Af Amer: 29 mL/min — ABNORMAL LOW (ref >60)
GFR calc non Af Amer: 25 mL/min — ABNORMAL LOW (ref >60)
GLUCOSE: 131 mg/dL — AB (ref 65–99)
SODIUM: 138 mmol/L (ref 135–145)
Total Bilirubin: 0.3 mg/dL (ref 0.3–1.2)
Total Protein: 7.1 g/dL (ref 6.5–8.1)

## 2015-07-09 LAB — BLOOD GAS, VENOUS
Acid-Base Excess: 2.3 mmol/L — ABNORMAL HIGH (ref 0.0–2.0)
Bicarbonate: 20.7 mEq/L (ref 20.0–24.0)
Drawn by: 338401
O2 Content: 4 L/min
O2 SAT: 62 %
PATIENT TEMPERATURE: 37
PO2 VEN: 40 mmHg (ref 31.0–45.0)
TCO2: 11.5 mmol/L (ref 0–100)
pCO2, Ven: 54.6 mmHg — ABNORMAL HIGH (ref 45.0–50.0)
pH, Ven: 7.261 (ref 7.250–7.300)

## 2015-07-09 LAB — BRAIN NATRIURETIC PEPTIDE: B Natriuretic Peptide: 636 pg/mL — ABNORMAL HIGH (ref 0.0–100.0)

## 2015-07-09 LAB — CBC WITH DIFFERENTIAL/PLATELET
BASOS PCT: 1 %
Basophils Absolute: 0.1 10*3/uL (ref 0.0–0.1)
EOS ABS: 1.2 10*3/uL — AB (ref 0.0–0.7)
EOS PCT: 11 %
HCT: 46 % (ref 36.0–46.0)
HEMOGLOBIN: 13.8 g/dL (ref 12.0–15.0)
LYMPHS ABS: 1.9 10*3/uL (ref 0.7–4.0)
Lymphocytes Relative: 16 %
MCH: 25.5 pg — AB (ref 26.0–34.0)
MCHC: 30 g/dL (ref 30.0–36.0)
MCV: 84.9 fL (ref 78.0–100.0)
MONO ABS: 1.1 10*3/uL — AB (ref 0.1–1.0)
MONOS PCT: 9 %
NEUTROS ABS: 7.4 10*3/uL (ref 1.7–7.7)
Neutrophils Relative %: 63 %
Platelets: 194 10*3/uL (ref 150–400)
RBC: 5.42 MIL/uL — ABNORMAL HIGH (ref 3.87–5.11)
RDW: 20.8 % — AB (ref 11.5–15.5)
WBC: 11.7 10*3/uL — ABNORMAL HIGH (ref 4.0–10.5)

## 2015-07-09 LAB — BASIC METABOLIC PANEL
Anion gap: 7 (ref 5–15)
BUN: 34 mg/dL — AB (ref 6–20)
CHLORIDE: 109 mmol/L (ref 101–111)
CO2: 25 mmol/L (ref 22–32)
Calcium: 9.7 mg/dL (ref 8.9–10.3)
Creatinine, Ser: 1.84 mg/dL — ABNORMAL HIGH (ref 0.44–1.00)
GFR calc Af Amer: 31 mL/min — ABNORMAL LOW (ref >60)
GFR, EST NON AFRICAN AMERICAN: 27 mL/min — AB (ref >60)
GLUCOSE: 237 mg/dL — AB (ref 65–99)
POTASSIUM: 5 mmol/L (ref 3.5–5.1)
Sodium: 141 mmol/L (ref 135–145)

## 2015-07-09 LAB — CBC
HEMATOCRIT: 45.2 % (ref 36.0–46.0)
Hemoglobin: 13.2 g/dL (ref 12.0–15.0)
MCH: 25.1 pg — AB (ref 26.0–34.0)
MCHC: 29.2 g/dL — AB (ref 30.0–36.0)
MCV: 85.9 fL (ref 78.0–100.0)
Platelets: 149 10*3/uL — ABNORMAL LOW (ref 150–400)
RBC: 5.26 MIL/uL — ABNORMAL HIGH (ref 3.87–5.11)
RDW: 20.9 % — AB (ref 11.5–15.5)
WBC: 9.3 10*3/uL (ref 4.0–10.5)

## 2015-07-09 LAB — TROPONIN I
TROPONIN I: 0.05 ng/mL — AB (ref <0.031)
Troponin I: 0.05 ng/mL — ABNORMAL HIGH (ref <0.031)
Troponin I: 0.04 ng/mL — ABNORMAL HIGH (ref <0.031)
Troponin I: 0.04 ng/mL — ABNORMAL HIGH (ref <0.031)

## 2015-07-09 LAB — PROCALCITONIN: PROCALCITONIN: 0.12 ng/mL

## 2015-07-09 LAB — GLUCOSE, CAPILLARY
GLUCOSE-CAPILLARY: 251 mg/dL — AB (ref 65–99)
Glucose-Capillary: 209 mg/dL — ABNORMAL HIGH (ref 65–99)

## 2015-07-09 LAB — LACTIC ACID, PLASMA: LACTIC ACID, VENOUS: 1.5 mmol/L (ref 0.5–2.0)

## 2015-07-09 LAB — MRSA PCR SCREENING: MRSA by PCR: NEGATIVE

## 2015-07-09 LAB — D-DIMER, QUANTITATIVE (NOT AT ARMC): D DIMER QUANT: 1.39 ug{FEU}/mL — AB (ref 0.00–0.50)

## 2015-07-09 MED ORDER — DEXTROSE 5 % IV SOLN
1.0000 g | INTRAVENOUS | Status: DC
  Administered 2015-07-10 (×2): 1 g via INTRAVENOUS
  Filled 2015-07-09 (×4): qty 10

## 2015-07-09 MED ORDER — ALBUTEROL SULFATE (2.5 MG/3ML) 0.083% IN NEBU: 5.0000 mg | INHALATION_SOLUTION | Freq: Once | RESPIRATORY_TRACT | Status: DC

## 2015-07-09 MED ORDER — INSULIN ASPART 100 UNIT/ML ~~LOC~~ SOLN
10.0000 [IU] | Freq: Once | SUBCUTANEOUS | Status: AC
  Administered 2015-07-09: 10 [IU] via INTRAVENOUS
  Filled 2015-07-09: qty 1

## 2015-07-09 MED ORDER — OXYCODONE HCL 5 MG PO TABS
5.0000 mg | ORAL_TABLET | ORAL | Status: DC | PRN
  Administered 2015-07-09 – 2015-07-11 (×4): 5 mg via ORAL
  Filled 2015-07-09 (×4): qty 1

## 2015-07-09 MED ORDER — ENOXAPARIN SODIUM 30 MG/0.3ML ~~LOC~~ SOLN
30.0000 mg | SUBCUTANEOUS | Status: DC
  Administered 2015-07-09 – 2015-07-10 (×2): 30 mg via SUBCUTANEOUS
  Filled 2015-07-09 (×2): qty 0.3

## 2015-07-09 MED ORDER — METHYLPREDNISOLONE SODIUM SUCC 125 MG IJ SOLR
125.0000 mg | Freq: Once | INTRAMUSCULAR | Status: AC
  Administered 2015-07-09: 125 mg via INTRAVENOUS
  Filled 2015-07-09: qty 2

## 2015-07-09 MED ORDER — TECHNETIUM TC 99M DIETHYLENETRIAME-PENTAACETIC ACID
30.0000 | Freq: Once | INTRAVENOUS | Status: AC | PRN
  Administered 2015-07-09: 30 via RESPIRATORY_TRACT

## 2015-07-09 MED ORDER — ONDANSETRON HCL 4 MG/2ML IJ SOLN
4.0000 mg | Freq: Four times a day (QID) | INTRAMUSCULAR | Status: DC | PRN
  Administered 2015-07-10 – 2015-07-11 (×2): 4 mg via INTRAVENOUS
  Filled 2015-07-09 (×2): qty 2

## 2015-07-09 MED ORDER — SODIUM CHLORIDE 0.9% FLUSH
3.0000 mL | Freq: Two times a day (BID) | INTRAVENOUS | Status: DC
  Administered 2015-07-09 – 2015-07-11 (×4): 3 mL via INTRAVENOUS

## 2015-07-09 MED ORDER — ALBUTEROL SULFATE (2.5 MG/3ML) 0.083% IN NEBU
2.5000 mg | INHALATION_SOLUTION | Freq: Once | RESPIRATORY_TRACT | Status: AC
Start: 2015-07-09 — End: 2015-07-09
  Administered 2015-07-09: 2.5 mg via RESPIRATORY_TRACT
  Filled 2015-07-09: qty 3

## 2015-07-09 MED ORDER — DEXTROSE 5 % IV SOLN
500.0000 mg | INTRAVENOUS | Status: DC
  Administered 2015-07-09: 500 mg via INTRAVENOUS
  Filled 2015-07-09: qty 500

## 2015-07-09 MED ORDER — DEXTROSE 50 % IV SOLN
1.0000 | Freq: Once | INTRAVENOUS | Status: AC
  Administered 2015-07-09: 50 mL via INTRAVENOUS

## 2015-07-09 MED ORDER — DEXTROSE 5 % IV SOLN
1.0000 g | Freq: Once | INTRAVENOUS | Status: AC
  Administered 2015-07-09: 1 g via INTRAVENOUS
  Filled 2015-07-09: qty 10

## 2015-07-09 MED ORDER — ACETAMINOPHEN 325 MG PO TABS
650.0000 mg | ORAL_TABLET | Freq: Four times a day (QID) | ORAL | Status: DC | PRN
  Administered 2015-07-11: 650 mg via ORAL
  Filled 2015-07-09: qty 2

## 2015-07-09 MED ORDER — SENNOSIDES-DOCUSATE SODIUM 8.6-50 MG PO TABS: 1.0000 | ORAL_TABLET | Freq: Every evening | ORAL | Status: DC | PRN

## 2015-07-09 MED ORDER — ONDANSETRON HCL 4 MG PO TABS: 4.0000 mg | ORAL_TABLET | Freq: Four times a day (QID) | ORAL | Status: DC | PRN

## 2015-07-09 MED ORDER — BUDESONIDE 0.25 MG/2ML IN SUSP
0.2500 mg | Freq: Two times a day (BID) | RESPIRATORY_TRACT | Status: DC
  Administered 2015-07-09 – 2015-07-11 (×4): 0.25 mg via RESPIRATORY_TRACT
  Filled 2015-07-09 (×4): qty 2

## 2015-07-09 MED ORDER — DOXYCYCLINE HYCLATE 100 MG PO TABS
100.0000 mg | ORAL_TABLET | Freq: Two times a day (BID) | ORAL | Status: DC
  Administered 2015-07-09 – 2015-07-11 (×5): 100 mg via ORAL
  Filled 2015-07-09 (×5): qty 1

## 2015-07-09 MED ORDER — STERILE WATER FOR INJECTION IJ SOLN
INTRAMUSCULAR | Status: AC
  Filled 2015-07-09: qty 10

## 2015-07-09 MED ORDER — SODIUM POLYSTYRENE SULFONATE 15 GM/60ML PO SUSP
30.0000 g | Freq: Once | ORAL | Status: AC
  Administered 2015-07-09: 30 g via ORAL
  Filled 2015-07-09: qty 120

## 2015-07-09 MED ORDER — OXYCODONE-ACETAMINOPHEN 5-325 MG PO TABS
ORAL_TABLET | ORAL | Status: DC | PRN
  Administered 2015-07-10 – 2015-07-11 (×3): 1 via ORAL
  Filled 2015-07-09 (×4): qty 1

## 2015-07-09 MED ORDER — METHYLPREDNISOLONE SODIUM SUCC 40 MG IJ SOLR
40.0000 mg | Freq: Every day | INTRAMUSCULAR | Status: DC
Start: 2015-07-09 — End: 2015-07-11
  Administered 2015-07-09 – 2015-07-11 (×3): 40 mg via INTRAVENOUS
  Filled 2015-07-09 (×3): qty 1

## 2015-07-09 MED ORDER — SODIUM BICARBONATE 8.4 % IV SOLN
50.0000 meq | Freq: Once | INTRAVENOUS | Status: AC
  Administered 2015-07-09: 50 meq via INTRAVENOUS
  Filled 2015-07-09: qty 50

## 2015-07-09 MED ORDER — SODIUM CHLORIDE 0.9 % IV BOLUS (SEPSIS)
1000.0000 mL | Freq: Once | INTRAVENOUS | Status: AC
  Administered 2015-07-09: 1000 mL via INTRAVENOUS

## 2015-07-09 MED ORDER — TECHNETIUM TO 99M ALBUMIN AGGREGATED
6.0000 | Freq: Once | INTRAVENOUS | Status: AC | PRN
  Administered 2015-07-09: 6 via INTRAVENOUS

## 2015-07-09 MED ORDER — IPRATROPIUM-ALBUTEROL 0.5-2.5 (3) MG/3ML IN SOLN
3.0000 mL | Freq: Once | RESPIRATORY_TRACT | Status: AC
  Administered 2015-07-09: 3 mL via RESPIRATORY_TRACT
  Filled 2015-07-09: qty 3

## 2015-07-09 MED ORDER — ASPIRIN EC 81 MG PO TBEC
81.0000 mg | DELAYED_RELEASE_TABLET | Freq: Every day | ORAL | Status: DC
  Administered 2015-07-09 – 2015-07-11 (×3): 81 mg via ORAL
  Filled 2015-07-09 (×3): qty 1

## 2015-07-09 MED ORDER — DEXTROSE 50 % IV SOLN
INTRAVENOUS | Status: AC
  Filled 2015-07-09: qty 50

## 2015-07-09 MED ORDER — SODIUM CHLORIDE 0.9 % IV SOLN
1.0000 g | Freq: Once | INTRAVENOUS | Status: AC
  Administered 2015-07-09: 1 g via INTRAVENOUS
  Filled 2015-07-09: qty 10

## 2015-07-09 MED ORDER — ALBUTEROL SULFATE (2.5 MG/3ML) 0.083% IN NEBU
2.5000 mg | INHALATION_SOLUTION | RESPIRATORY_TRACT | Status: DC | PRN
  Filled 2015-07-09: qty 3

## 2015-07-09 MED ORDER — IPRATROPIUM BROMIDE 0.02 % IN SOLN: 0.5000 mg | Freq: Once | RESPIRATORY_TRACT | Status: DC

## 2015-07-09 MED ORDER — DOCUSATE SODIUM 100 MG PO CAPS
100.0000 mg | ORAL_CAPSULE | Freq: Every day | ORAL | Status: DC
  Administered 2015-07-09 – 2015-07-11 (×2): 100 mg via ORAL
  Filled 2015-07-09 (×3): qty 1

## 2015-07-09 MED ORDER — SODIUM CHLORIDE 0.9 % IV SOLN
INTRAVENOUS | Status: AC
  Administered 2015-07-09: 04:00:00 via INTRAVENOUS

## 2015-07-09 MED ORDER — ACETAMINOPHEN 650 MG RE SUPP: 650.0000 mg | Freq: Four times a day (QID) | RECTAL | Status: DC | PRN

## 2015-07-09 MED ORDER — DARIFENACIN HYDROBROMIDE ER 7.5 MG PO TB24
7.5000 mg | ORAL_TABLET | Freq: Every day | ORAL | Status: DC
  Administered 2015-07-09 – 2015-07-11 (×3): 7.5 mg via ORAL
  Filled 2015-07-09 (×3): qty 1

## 2015-07-09 MED ORDER — ALBUTEROL SULFATE (2.5 MG/3ML) 0.083% IN NEBU
2.5000 mg | INHALATION_SOLUTION | Freq: Four times a day (QID) | RESPIRATORY_TRACT | Status: DC
  Administered 2015-07-09 – 2015-07-11 (×10): 2.5 mg via RESPIRATORY_TRACT
  Filled 2015-07-09 (×11): qty 3

## 2015-07-09 NOTE — Care Management Note (Signed)
Case Management Note  Patient Details  Name: [name] MRN: [id number] Date of Birth: Jun 28, 1945  Subjective/Objective:   Spoke with patient and stepson Catrena Thurn who is stated POA. Patient does not feel like talking. Son stated that patient has a walker a cane and a wheelchair in the home. No home O2. PCP is Dr Teressa Senter. Will need PT evaluation, Has been having falls. Patient grumpily stated that she does not want PT or to go to rehab because "I can't hold my water" meaning she is incontinent. Son stated patient is in a bad mood because of a family issue. I agreed to return later to discuss plan.  Anticipate some form of PT at discharge and possible home O2.                 Action/Plan: Home with Home Health.    Expected Discharge Date:  07/13/15               Expected Discharge Plan:  [place]  In-House Referral:     Discharge planning Services  CM Consult  Post Acute Care Choice:    Choice offered to:     DME Arranged:    DME Agency:     HH Arranged:    HH Agency:     Status of Service:  In process, will continue to follow  Medicare Important Message Given:    Date Medicare IM Given:    Medicare IM give by:    Date Additional Medicare IM Given:    Additional Medicare Important Message give by:     If discussed at [place] of Stay Meetings, dates discussed:    Additional Comments:  Alvie Heidelberg, RN 07/09/2015, 3:34 PM

## 2015-07-09 NOTE — Progress Notes (Signed)
[place] for Renal Adjustment of ABX if needed  Antibiotics: Doxycycline 4/6 >> Rocephin 4/5 >>  Allergies  Allergen Reactions  . Prednisone Other (See Comments)    Chest pain   Patient Measurements: Height: 5\' 1"  (154.9 cm) Weight: 147 lb 11.2 oz (66.996 kg) IBW/kg (Calculated) : 47.8  Vital Signs: Temp: 98 F (36.7 C) (04/06 0500) Temp Source: Oral (04/06 0500) BP: 111/57 mmHg (04/06 0500) Pulse Rate: 70 (04/06 0500)  Labs:  Recent Labs  07/09/15 0040 07/09/15 0358  WBC 11.7* 9.3  HGB 13.8 13.2  PLT 194 149*  CREATININE 1.94* 1.84*   No results for input(s): VANCOTROUGH, VANCOPEAK, VANCORANDOM, GENTTROUGH, GENTPEAK, GENTRANDOM, TOBRATROUGH, TOBRAPEAK, TOBRARND, AMIKACINPEAK, AMIKACINTROU, AMIKACIN in the last 72 hours.   Medical History: Past Medical History  Diagnosis Date  . Sleep apnea     uses cpap  . Peripheral vascular disease ([place])   . GERD (gastroesophageal reflux disease)   . Neuromuscular disorder ([place])     HX of MD  . Diabetes ([place])   . Sinus node dysfunction ([facility])   . H/O cardiac pacemaker   . Tobacco abuse   . Hyperlipidemia   . SSS (sick sinus syndrome) ([place])   . DDD (degenerative disc disease), lumbar    Assessment/Plan 1. Acute hypoxic and hypercarbic respiratory failure:  This is new.Etiology likely multifactorial, COPD predominates, possible pneumonia. PE is doubted given her exam findings consistent with COPD.  -Ceftriaxone and doxycycline for CAP, no renal adjustment needed Estimated Creatinine Clearance: 24.9 mL/min (by C-G formula based on Cr of 1.84).  Plan: Continue current Rx Duration of therapy per MD, anticipate 8 days  Ena Dawley, [facility] 07/09/2015

## 2015-07-09 NOTE — ED Notes (Signed)
Pt given a pain pill from her home meds, EDP notified.

## 2015-07-09 NOTE — ED Provider Notes (Signed)
CSN: [id number]     Arrival date & time 07/09/15  0013 History  By signing my name below, I, Doran Stabler, attest that this documentation has been prepared under the direction and in the presence of Rolland Porter, MD at [place] [facility]. Electronically Signed: Doran Stabler, ED Scribe. 07/09/2015. 12:34 AM.   Chief Complaint  Patient presents with  . Fatigue   The history is provided by the patient. No language interpreter was used.   HPI Comments: [name] is a 70 y.o. female who presents to the Emergency Department with a PMHx of DM and neuromuscular disorder complaining of ongoing fatigue that has worsened two days ago. Pt reports productive cough and occasional wheezes. She reports she's been coughing "for a long time" however it got worse the past 2 days. She states her symptoms improved after a breathing treatment en route to the hospital given by EMS. Pt denies fevers, chills, CP, diaphoresis, sore throat, rhinorrhea, abdominal pain, nausea, or any other symptoms at this time. She does describe constipation. Pt smokes 1 pack a day and does not drink. She states she has nebulizer machine at home however she does not use it.  Pt lives at home with her two sons. Pt has been on oxygen at home over a year ago when she had pneumonia, however she is not on oxygen now  Pt has chronic constipation and chronic tailbone pain.   PCP Robert Bellow, MD Cardiologist "Dr. Loletha Grayer" Dr Sallyanne Kuster  Past Medical History  Diagnosis Date  . Sleep apnea     uses cpap  . Peripheral vascular disease ([place])   . GERD (gastroesophageal reflux disease)   . Neuromuscular disorder ([place])     HX of MD  . Diabetes ([place])   . Sinus node dysfunction ([facility])   . H/O cardiac pacemaker   . Tobacco abuse   . Hyperlipidemia   . SSS (sick sinus syndrome) ([place])   . DDD (degenerative disc disease), lumbar    Past Surgical History  Procedure Laterality Date  . Insert / replace / remove pacemaker    . Cholecystectomy    .  Abdominal hysterectomy    . Tonsillectomy    . Tubal ligation    . Femoral artery stent  02/18/2011  . Esophagogastroduodenoscopy  04/13/2002    A short segment of salmon-colored epithelium in the distal esophagus  consistent with Barrett's esophagus, biopsied/ The remainder of the esophageal mucosa, stomach, and duodenum through the second portion appeared normal  . Colonoscopy  04/13/2002    Diminutive polyps in the rectum cold biopsied/removed/ The remainder of the rectal mucosa and colon appeared normal.  . Lower extremity angiogram N/A 02/18/2011    Procedure: LOWER EXTREMITY ANGIOGRAM;  Surgeon: Lorretta Harp, MD;  Location[facility] C[facility] LAB;  Service: Cardiovascular;  Laterality: N/A;  . Lower extremity angiogram N/A 10/29/2012    Procedure: LOWER EXTREMITY ANGIOGRAM;  Surgeon: Lorretta Harp, MD;  Location: [facility] CATH LAB;  Service: Cardiovascular;  Laterality: N/A;  . Abdominal angiogram  10/29/2012    Procedure: ABDOMINAL ANGIOGRAM;  Surgeon: Lorretta Harp, MD;  Location: [facility] CATH LAB;  Service: Cardiovascular;;  . Implantable cardioverter defibrillator generator change Left 10/29/2013    Procedure: IMPLANTABLE CARDIOVERTER DEFIBRILLATOR GENERATOR CHANGE;  Surgeon: Sanda Klein, MD;  Location: [facility] CATH LAB;  Service: Cardiovascular;  Laterality: Left;  . Transthoracic echocardiogram  10/05/2011    EF 50-55% Pacemaker present,   . Transthoracic echocardiogram  05/20/2010    EF=>55%, pacer enduced  LBBB, conduction abnormality  . Nm myocar perf wall motion  10/05/2011    Protocol:Lexiscan, mild perfusion defect due to pacing induced artifact  . Nm myocar perf wall motion  05/20/2010    Protocol:Persantine, EF78%, normal perfusion in all regions,   . Colonoscopy with propofol N/A 01/23/2015    Procedure: COLONOSCOPY WITH PROPOFOL;  Surgeon: Rogene Houston, MD;  Location: [facility] ORS;  Service: Endoscopy;  Laterality: N/A;  [place] time in 0814  time out  0837  total time 23 minutes  .  Esophagogastroduodenoscopy (egd) with propofol N/A 01/23/2015    Procedure: ESOPHAGOGASTRODUODENOSCOPY (EGD) WITH PROPOFOL;  Surgeon: Rogene Houston, MD;  Location: [facility] ORS;  Service: Endoscopy;  Laterality: N/A;  procedure 1  . Esophageal dilation N/A 01/23/2015    Procedure: ESOPHAGEAL DILATION;  Surgeon: Rogene Houston, MD;  Location: [facility] ORS;  Service: Endoscopy;  Laterality: N/A;  [place] 75  . Biopsy N/A 01/23/2015    Procedure: BIOPSY;  Surgeon: Rogene Houston, MD;  Location: [facility] ORS;  Service: Endoscopy;  Laterality: N/A;  duodenal and esophageal  . Polypectomy N/A 01/23/2015    Procedure: POLYPECTOMY;  Surgeon: Rogene Houston, MD;  Location: [facility] ORS;  Service: Endoscopy;  Laterality: N/A;  cecal, hepatic flexure, distal transverse colon   Family History  Problem Relation Age of Onset  . Cancer Mother   . Cancer Father    Social History  Substance Use Topics  . Smoking status: Current Every Day Smoker -- 1.00 packs/day for 50 years    Types: Cigarettes    Last Attempt to Quit: 02/02/2013  . Smokeless tobacco: Never Used     Comment: "LIGHT THEM AND LET THEM BURN UP"  . Alcohol Use: No   Lives at home Lives with sons  OB History    No data available     Review of Systems  Constitutional: Positive for fatigue. Negative for fever, chills and diaphoresis.  HENT: Negative for rhinorrhea and sore throat.   Respiratory: Positive for cough and wheezing.   Cardiovascular: Negative for chest pain.  Gastrointestinal: Negative for nausea and abdominal pain.  All other systems reviewed and are negative.   Allergies  Prednisone  Home Medications   Prior to Admission medications   Medication Sig Start Date End Date Taking? Authorizing Provider  albuterol (PROVENTIL) (2.5 MG/3ML) 0.083% nebulizer solution Take 2.5 mg by nebulization every 6 (six) hours as needed for wheezing or shortness of breath.    Yes Historical Provider, MD  aspirin 81 MG tablet Take 1 tablet (81 mg total)  by mouth daily. 03/03/15  Yes Mihai Croitoru, MD  docusate sodium (COLACE) 100 MG capsule Take 100 mg by mouth daily.   Yes Historical Provider, MD  guaiFENesin (MUCINEX) 600 MG 12 hr tablet Take 1 tablet (600 mg total) by mouth 2 (two) times daily. 04/15/13  Yes Nimish Luther Parody, MD  hydrocortisone (ANUSOL-HC) 2.5 % rectal cream Place 1 application rectally 2 (two) times daily. 01/23/15  Yes Rogene Houston, MD  Multiple Vitamins-Minerals (CENTRUM SILVER ULTRA WOMENS) TABS Take by mouth daily.   Yes Historical Provider, MD  oxyCODONE-acetaminophen (PERCOCET) 10-325 MG per tablet Take 1 tablet by mouth 4 (four) times daily as needed for pain.    Yes Historical Provider, MD  solifenacin (VESICARE) 5 MG tablet Take 5 mg by mouth daily.   Yes Historical Provider, MD  sulfamethoxazole-trimethoprim (BACTRIM DS,SEPTRA DS) 800-160 MG tablet Take 1 tablet by mouth 2 (two) times daily.   Yes  Historical Provider, MD   ED Triage Vitals  Enc Vitals Group     BP 07/09/15 0018 110/58 mmHg     Pulse Rate 07/09/15 0018 70     Resp 07/09/15 0018 20     Temp 07/09/15 0018 98.5 F (36.9 C)     Temp src --      SpO2 07/09/15 0018 77 %     Weight 07/09/15 0018 160 lb (72.576 kg)     Height 07/09/15 0018 5\' 2"  (1.575 m)     Head Cir --      Peak Flow --      Pain Score 07/09/15 0014 10     Pain Loc --      Pain Edu? --      Excl. in [place]? --      Vital signs normal except for hypoxia    Physical Exam  Constitutional: She is oriented to person, place, and time. She appears well-developed and well-nourished.  Non-toxic appearance. She does not appear ill. No distress.  HENT:  Head: Normocephalic and atraumatic.  Right Ear: External ear normal.  Left Ear: External ear normal.  Nose: Nose normal. No mucosal edema or rhinorrhea.  Mouth/Throat: Oropharynx is clear and moist and mucous membranes are normal. No dental abscesses or uvula swelling.  Eyes: Conjunctivae and EOM are normal. Pupils are equal,  round, and reactive to light.  Neck: Normal range of motion and full passive range of motion without pain. Neck supple.  Cardiovascular: Normal rate and regular rhythm.  Exam reveals no gallop and no friction rub.   Murmur heard.  Systolic murmur is present  Murmur loudest at left sternal border  Pulmonary/Chest: Effort normal. No respiratory distress. She has decreased breath sounds. She has wheezes (rare). She has no rhonchi. She has no rales. She exhibits no tenderness and no crepitus.  Abdominal: Soft. Normal appearance and bowel sounds are normal. She exhibits no distension. There is no tenderness. There is no rebound and no guarding.  Musculoskeletal: Normal range of motion. She exhibits no edema or tenderness.  Moves all extremities well.   Neurological: She is alert and oriented to person, place, and time. She has normal strength. No cranial nerve deficit.  Skin: Skin is warm, dry and intact. No rash noted. No erythema. No pallor.  Psychiatric: She has a normal mood and affect. Her speech is normal and behavior is normal. Her mood appears not anxious.  Nursing note and vitals reviewed.   ED Course  Procedures  Medications  cefTRIAXone (ROCEPHIN) 1 g in dextrose 5 % 50 mL IVPB (1 g Intravenous New Bag/Given 07/09/15 0204)  azithromycin (ZITHROMAX) 500 mg in dextrose 5 % 250 mL IVPB (not administered)  sodium chloride 0.9 % bolus 1,000 mL (not administered)  methylPREDNISolone sodium succinate (SOLU-MEDROL) 125 mg/2 mL injection 125 mg (125 mg Intravenous Given 07/09/15 0041)  ipratropium-albuterol (DUONEB) 0.5-2.5 (3) MG/3ML nebulizer solution 3 mL (3 mLs Nebulization Given 07/09/15 0042)  albuterol (PROVENTIL) (2.5 MG/3ML) 0.083% nebulizer solution 2.5 mg (2.5 mg Nebulization Given 07/09/15 0042)  sodium bicarbonate injection 50 mEq (50 mEq Intravenous Given 07/09/15 0134)  calcium gluconate 1 g in sodium chloride 0.9 % 100 mL IVPB (0 g Intravenous Stopped 07/09/15 0204)  dextrose 50 %  solution 50 mL (50 mLs Intravenous Given 07/09/15 0134)  sodium polystyrene (KAYEXALATE) 15 GM/60ML suspension 30 g (30 g Oral Given 07/09/15 0142)  insulin aspart (novoLOG) injection 10 Units (10 Units Intravenous Given 07/09/15 0134)  DIAGNOSTIC STUDIES: Oxygen Saturation is 77% on [place], low by my interpretation.    COORDINATION OF CARE: 12:24 AM patient was noted to be very hypoxic in triage with pulse ox of 77%. She was placed on nasal cannula oxygen and with 4 L she was able to maintain her pulse ox in the low 90s. Will give breathing treatment. Will order CXR and blood work. Discussed treatment plan with pt at bedside and pt agreed to plan. Patient was given another albuterol/Atrovent nebulizer treatment. She was given IV steroids. Patient refused ABG, VBG was done.  1:25 AM After reviewing patient's x-ray she was started on community-acquired pneumonia antibiotics, Rocephin and Zithromax.  I reviewed patient's positive d-dimer however she has a renal insufficiency and cannot get CT angiogram of the chest done  2:10 AM lab called back patient's critical high potassium of 6.1. Pt treated for hyperkalemia with IV calcium, IV bicarb, IV insulin and D50 and given oral kayexalate.   02:26 Dr Loleta Books, hospitalist, will admit to tele  Patient is agreeable for admission.   Patient noted to have a bottle of oxycodone 10/325 at the bedside. She had #120 tablets filled on March 20.  Labs Review Results for orders placed or performed during the hospital encounter of 07/09/15  Comprehensive metabolic panel  Result Value Ref Range   Sodium 138 135 - 145 mmol/L   Potassium  3.5 - 5.1 mmol/L    CRITICAL RESULT CALLED TO, READ BACK BY AND VERIFIED WITH:   Ch[place]ide 106 101 - 111 mmol/L   CO2 25 22 - 32 mmol/L   Glucose, Bld 131 (H) 65 - 99 mg/dL   BUN 36 (H) 6 - 20 mg/dL   Creatinine, Ser 1.94 (H) 0.44 - 1.00 mg/dL   Calcium 10.0 8.9 - 10.3 mg/dL   Total Protein 7.1 6.5 - 8.1 g/dL   Albumin 3.6  3.5 - 5.0 g/dL   AST 19 15 - 41 U/L   ALT 14 14 - 54 U/L   Alkaline Phosphatase 80 38 - 126 U/L   Total Bilirubin 0.3 0.3 - 1.2 mg/dL   GFR calc non Af Amer 25 (L) >60 mL/min   GFR calc Af Amer 29 (L) >60 mL/min   Anion gap 7 5 - 15  CBC with Differential  Result Value Ref Range   WBC 11.7 (H) 4.0 - 10.5 K/uL   RBC 5.42 (H) 3.87 - 5.11 MIL/uL   Hemoglobin 13.8 12.0 - 15.0 g/dL   HCT 46.0 36.0 - 46.0 %   MCV 84.9 78.0 - 100.0 fL   MCH 25.5 (L) 26.0 - 34.0 pg   MCHC 30.0 30.0 - 36.0 g/dL   RDW 20.8 (H) 11.5 - 15.5 %   Platelets 194 150 - 400 K/uL   Neutrophils Relative % 63 %   Neutro Abs 7.4 1.7 - 7.7 K/uL   Lymphocytes Relative 16 %   Lymphs Abs 1.9 0.7 - 4.0 K/uL   Monocytes Relative 9 %   Monocytes Absolute 1.1 (H) 0.1 - 1.0 K/uL   Eosinophils Relative 11 %   Eosinophils Absolute 1.2 (H) 0.0 - 0.7 K/uL   Basophils Relative 1 %   Basophils Absolute 0.1 0.0 - 0.1 K/uL  D-dimer, quantitative  Result Value Ref Range   D-Dimer, Quant 1.39 (H) 0.00 - 0.50 ug/mL-FEU  Troponin I  Result Value Ref Range   Troponin I 0.05 (H) <0.031 ng/mL  Brain natriuretic peptide  Result Value Ref Range   B Natriuretic Peptide  636.0 (H) 0.0 - 100.0 pg/mL  Blood gas, venous  Result Value Ref Range   O2 Content 4.0 L/min   Delivery systems NASAL CANNULA    pH, Ven 7.261 7.250 - 7.300   pCO2, Ven 54.6 (H) 45.0 - 50.0 mmHg   pO2, Ven 40.0 31.0 - 45.0 mmHg   Bicarbonate 20.7 20.0 - 24.0 mEq/L   TCO2 11.5 0 - 100 mmol/L   Acid-Base Excess 2.3 (H) 0.0 - 2.0 mmol/L   O2 Saturation 62.0 %   Patient temperature 37.0    Collection site VENOUS    Drawn by [phone]    Sample type VEIN   Lactic acid, plasma  Result Value Ref Range   Lactic Acid, Venous 1.5 0.5 - 2.0 mmol/L     Laboratory interpretation all normal except hyperkalemia, worsening of her baseline renal insufficiency, persistent elevation of her troponin over the past year, elevated d-dimer however it has been elevated before,  leukocytosis, elevated BUN consistent with dehydration   Imaging Review Dg Chest Port 1 View  07/09/2015  CLINICAL DATA:  Productive cough.  Worsening fatigue.  Dyspnea. EXAM: PORTABLE CHEST 1 VIEW COMPARISON:  08/25/2014 FINDINGS: There is consolidation in the left base, and a small left effusion. This may represent pneumonia. The right lung is clear. There is moderate unchanged cardiomegaly. There are intact appearances of the dual-lumen transvenous cardiac leads. Pulmonary vasculature is normal. IMPRESSION: Left base consolidation and effusion. Probable pneumonia. Recommend follow-up radiography after treatment, 3 6 weeks, to confirm clearance and exclude neoplasm. Electronically Signed   By: Andreas Newport M.D.   On: 07/09/2015 01:02   I have personally reviewed and evaluated these images and lab results as part of my medical decision-making.   EKG Interpretation   Date/Time:  Thursday July 09 2015 00:23:59 EDT Ventricular Rate:  70 PR Interval:  273 QRS Duration: 150 QT Interval:  439 QTC Calculation: 474 R Axis:   -59 Text Interpretation:  Sinus or ectopic atrial rhythm Prolonged PR interval  Left bundle branch block Confirmed by Othello Dickenson  MD-I, Inocencia Murtaugh ([id number]) on 07/09/2015  12:33:28 AM      PT has had prior LBBB, her last EKG had pacemaker failure  MDM   Final diagnoses:  CAP (community acquired pneumonia)  Hypoxia  Hyperkalemia   Plan admission  CRITICAL CARE Performed by: Rolland Porter L Total critical care time: 45 minutes Critical care time was exclusive of separately billable procedures and treating other patients. Critical care was necessary to treat or prevent imminent or life-threatening deterioration. Critical care was time spent personally by me on the following activities: development of treatment plan with patient and/or surrogate as well as nursing, discussions with consultants, evaluation of patient's response to treatment, examination of patient, obtaining  history from patient or surrogate, ordering and performing treatments and interventions, ordering and review of laboratory studies, ordering and review of radiographic studies, pulse oximetry and re-evaluation of patient's condition.   I personally performed the services described in this documentation, which was scribed in my presence. The recorded information has been reviewed and considered.  Rolland Porter, MD, Barbette Or, MD 07/09/15 [phone]

## 2015-07-09 NOTE — ED Notes (Signed)
Pt having increased weakness and unable to ambulate by herself. When ems arrived pt o2 sat was 78% on room air.

## 2015-07-09 NOTE — Progress Notes (Signed)
Inpatient Diabetes Program Recommendations  AACE/ADA: New Consensus Statement on Inpatient Glycemic Control (2015)  Target Ranges:  Prepandial:   less than 140 mg/dL      Peak postprandial:   less than 180 mg/dL (1-2 hours)      Critically ill patients:  140 - 180 mg/dL  Results for [name], [name] (MRN [id number]) as of 07/09/2015 11:17  Ref. Range 08/27/2014 07:29 08/27/2014 11:57 01/23/2015 06:38 01/23/2015 08:51 07/09/2015 07:19  Glucose-Capillary Latest Ref Range: 65-99 mg/dL 104 (H) 95 112 (H) 100 (H) 209 (H)   07/09/15 No hx  of DM noted.  However pt taking Solumedrol 40 mg daily.  Please consider adding sensitive correction scale tidac and hs, and changing diet to CHO modified while on IV steroids.  [place], CDE. M.Ed. Pager [phone] Inpatient Diabetes Coordinator

## 2015-07-09 NOTE — H&P (Signed)
History and Physical  Patient Name: [name]     [id number]    DOB: 21-Aug-1945    DOA: 07/09/2015 Referring physician: Rolland Porter, MD PCP: Robert Bellow, MD      Chief Complaint: Cough and malaise  HPI: [name] is a 70 y.o. female with a past medical history significant for pAF not on warfarin, pacemaker, PVD, COPD and smoking who presents with cough and dyspnea.  The patient started noticing acute on chronic dyspnea, cough, and "choking up" about three days ago.  This worsened over the next few days, was associated with decreased appetite and increased weakness/fatigue, until she came to the ER today.  In the ED, she was hypoxic to 77% on RA, afebrile and hemodynamically stable.  Na 138, K 6.1, Cr 1.94 (from baseline around 1), WBC 11.7K, BNP 636 (below previous), troponin 0.05, d-dimer elevated.  1 view CXR showed a questionable L base opacity and ECG showed paced rhythm.  She was administered bronchodilators and IV antibiotics and steroids for presumed COPD flare and CAP, and TRH were asked to evaluate for admission.  Of note, she was diagnosed with UTI one week ago and has been on Bactrim DS BID for the last week.     Review of Systems:  Pt complains of cough, dyspnea, decreased appetite, chronic incontinence, worsening leg weakness without leg pain, emesis from cough. Pt denies any fever, purulent sputum, chest pain, chest tightness, wheezing, dysuria.  All other systems negative except as just noted or noted in the history of present illness.  Allergies  Allergen Reactions  . Prednisone Other (See Comments)    Chest pain    Prior to Admission medications   Medication Sig Start Date End Date Taking? Authorizing Provider  albuterol (PROVENTIL) (2.5 MG/3ML) 0.083% nebulizer solution Take 2.5 mg by nebulization every 6 (six) hours as needed for wheezing or shortness of breath.    Yes Historical Provider, MD  aspirin 81 MG tablet Take 1 tablet (81 mg total) by  mouth daily. 03/03/15  Yes Mihai Croitoru, MD  docusate sodium (COLACE) 100 MG capsule Take 100 mg by mouth daily.   Yes Historical Provider, MD  guaiFENesin (MUCINEX) 600 MG 12 hr tablet Take 1 tablet (600 mg total) by mouth 2 (two) times daily. 04/15/13  Yes Nimish Luther Parody, MD  hydrocortisone (ANUSOL-HC) 2.5 % rectal cream Place 1 application rectally 2 (two) times daily. 01/23/15  Yes Rogene Houston, MD  Multiple Vitamins-Minerals (CENTRUM SILVER ULTRA WOMENS) TABS Take by mouth daily.   Yes Historical Provider, MD  oxyCODONE-acetaminophen (PERCOCET) 10-325 MG per tablet Take 1 tablet by mouth 4 (four) times daily as needed for pain.    Yes Historical Provider, MD  solifenacin (VESICARE) 5 MG tablet Take 5 mg by mouth daily.   Yes Historical Provider, MD    Past Medical History  Diagnosis Date  . Sleep apnea     uses cpap  . Peripheral vascular disease ([place])   . GERD (gastroesophageal reflux disease)   . Neuromuscular disorder ([place])     HX of MD  . Diabetes ([place])   . Sinus node dysfunction ([facility])   . H/O cardiac pacemaker   . Tobacco abuse   . Hyperlipidemia   . SSS (sick sinus syndrome) ([place])   . DDD (degenerative disc disease), lumbar     Past Surgical History  Procedure Laterality Date  . Insert / replace / remove pacemaker    . Cholecystectomy    .  Abdominal hysterectomy    . Tonsillectomy    . Tubal ligation    . Femoral artery stent  02/18/2011  . Esophagogastroduodenoscopy  04/13/2002    A short segment of salmon-colored epithelium in the distal esophagus  consistent with Barrett's esophagus, biopsied/ The remainder of the esophageal mucosa, stomach, and duodenum through the second portion appeared normal  . Colonoscopy  04/13/2002    Diminutive polyps in the rectum cold biopsied/removed/ The remainder of the rectal mucosa and colon appeared normal.  . Lower extremity angiogram N/A 02/18/2011    Procedure: LOWER EXTREMITY ANGIOGRAM;  Surgeon: Lorretta Harp, MD;   Location: [facility] CATH LAB;  Service: Cardiovascular;  Laterality: N/A;  . Lower extremity angiogram N/A 10/29/2012    Procedure: LOWER EXTREMITY ANGIOGRAM;  Surgeon: Lorretta Harp, MD;  Location: [facility] CATH LAB;  Service: Cardiovascular;  Laterality: N/A;  . Abdominal angiogram  10/29/2012    Procedure: ABDOMINAL ANGIOGRAM;  Surgeon: Lorretta Harp, MD;  Location: [facility] CATH LAB;  Service: Cardiovascular;;  . Implantable cardioverter defibrillator generator change Left 10/29/2013    Procedure: IMPLANTABLE CARDIOVERTER DEFIBRILLATOR GENERATOR CHANGE;  Surgeon: Sanda Klein, MD;  Location: [facility] CATH LAB;  Service: Cardiovascular;  Laterality: Left;  . Transthoracic echocardiogram  10/05/2011    EF 50-55% Pacemaker present,   . Transthoracic echocardiogram  05/20/2010    EF=>55%, pacer enduced LBBB, conduction abnormality  . Nm myocar perf wall motion  10/05/2011    Protocol:Lexiscan, mild perfusion defect due to pacing induced artifact  . Nm myocar perf wall motion  05/20/2010    Protocol:Persantine, EF78%, normal perfusion in all regions,   . Colonoscopy with propofol N/A 01/23/2015    Procedure: COLONOSCOPY WITH PROPOFOL;  Surgeon: Rogene Houston, MD;  Location: [facility] ORS;  Service: Endoscopy;  Laterality: N/A;  [place] time in 0814  time out  0837  total time 23 minutes  . Esophagogastroduodenoscopy (egd) with propofol N/A 01/23/2015    Procedure: ESOPHAGOGASTRODUODENOSCOPY (EGD) WITH PROPOFOL;  Surgeon: Rogene Houston, MD;  Location: [facility] ORS;  Service: Endoscopy;  Laterality: N/A;  procedure 1  . Esophageal dilation N/A 01/23/2015    Procedure: ESOPHAGEAL DILATION;  Surgeon: Rogene Houston, MD;  Location: [facility] ORS;  Service: Endoscopy;  Laterality: N/A;  [place] 17  . Biopsy N/A 01/23/2015    Procedure: BIOPSY;  Surgeon: Rogene Houston, MD;  Location: [facility] ORS;  Service: Endoscopy;  Laterality: N/A;  duodenal and esophageal  . Polypectomy N/A 01/23/2015    Procedure: POLYPECTOMY;  Surgeon: Rogene Houston, MD;  Location: [facility] ORS;  Service: Endoscopy;  Laterality: N/A;  cecal, hepatic flexure, distal transverse colon    Family history: family history includes Cancer in her father and mother.  Social History: Patient lives with her sons.  She uses a walker or a wheelchair.  She smokes daily.         Physical Exam: BP 112/50 mmHg  Pulse 70  Temp(Src) 98.4 F (36.9 C) (Oral)  Resp 14  Ht 5\' 1"  (1.549 m)  Wt 67 kg (147 lb 11.3 oz)  BMI 27.92 kg/m2  SpO2 93% General appearance: Thin frail elderly adult female, alert and in mild distress from cough, weak and tired.   Eyes: Anicteric, conjunctiva pink, lids and lashes normal.     ENT: No nasal deformity, discharge, or epistaxis.  OP tacky dry without lesions.   Lymph: No cervical or supraclavicular lymphadenopathy. Skin: Warm and dry.   Cardiac: RRR, nl S1-S2, no murmurs appreciated.  Capillary refill is brisk.  JVP normal.  No LE edema.  Radial pulses 2+ and symmetric.  DP pulses not felt but extremities warm and toes mobile. Respiratory: Normal respiratory rate and rhythm.  Rales at right midlung on my exam.  Expiratory wheezes bilaterally, breath sounds tight. Abdomen: Abdomen soft without rigidity.  No TTP. No ascites, distension.   MSK: No deformities or effusions. Neuro: Tired and sluggish, but sensorium intact, A&Ox3 and responding to questions, attention normal.  Speech is fluent.  Moves all extremities equally and with normal coordination.    Psych: Behavior appropriate.  Affect normal.  No evidence of aural or visual hallucinations or delusions.       Labs on Admission:  The metabolic panel shows hyperkalemia, AKI. lactate normal. BNP 636 pg per no (lower than at her last appointment with her cardiologist last fall) Troponin 0.05 ng per mL D-dimer elevated  The complete blood count shows mild leukocytosis, no anemia or thrombocytopenia.   Radiological Exams on Admission: Personally reviewed: Dg Chest Port 1  View  07/09/2015  CLINICAL DATA:  Productive cough.  Worsening fatigue.  Dyspnea. EXAM: PORTABLE CHEST 1 VIEW COMPARISON:  08/25/2014 FINDINGS: There is consolidation in the left base, and a small left effusion. This may represent pneumonia. The right lung is clear. There is moderate unchanged cardiomegaly. There are intact appearances of the dual-lumen transvenous cardiac leads. Pulmonary vasculature is normal. IMPRESSION: Left base consolidation and effusion. Probable pneumonia. Recommend follow-up radiography after treatment, 3 6 weeks, to confirm clearance and exclude neoplasm. Electronically Signed   By: Andreas Newport M.D.   On: 07/09/2015 01:02    EKG: Independently reviewed. ACE paced rhythm.     Assessment/Plan 1. Acute hypoxic and hypercarbic respiratory failure:  This is new.   Meets criteria given presentation in respiratory distress with hypoxia to the 70s and hypercarbia on VBG.  Etiology likely multifactorial, COPD predominates, possible pneumonia. PE is doubted given her exam findings consistent with COPD.    -Ceftriaxone and doxycycline for CAP -Solumedrol and bronchodilators as below -Check procalcitonin -Repeat 2V chest x-ray tomorrow -Check S pneumo antigen -Incentive spirometry and supplemental O2 as needed   2. Hyperkalemia:  This is new.  Kayexalate, bicarb and insulin/D50 given.  Caused by AKI, likely. -Fluids and trend K  3. COPD with exacerbation:  -Solumedrol 40 mg BID -Nebulized albuterol scheduled and PRN  4. Elevated troponin:  -Trend enzymes  5. AKI:  -Fluids and trend BMP -Check UA, urine electrolytes  6. Peripheral vascular disease and hypertension:  -Continue aspirin  7. Paroxysmal A. fib with pacer:  CHADS2-VASc 3. Not on warfarin due to patient preference. -Telemetry  8. Elevated d-dimer: -Will obtain VQ    DVT PPx: Lovenox Diet: Regular Consultants: None Code Status: FULL Family Communication: None present  Medical decision  making: What exists of the patient's previous chart was reviewed in depth and the case was discussed with Dr. Tomi Bamberger. Patient seen 2:00 AM on 07/09/2015.  Disposition Plan:  I recommend admission to telemetry, inpatient status.  Clinical condition: stable but potential to deteriorate given hypoxia, troponin leak.  Anticipate antibiotics, steroids, nebulizers, and monitor clinical response.  Likely several days hospitalization.      Edwin Dada [facility] Pager [phone]

## 2015-07-09 NOTE — Progress Notes (Signed)
Patient seen and examined. Admitted after midnight secondary to SOB, cough and hypoxia. Patient with elevated D-dimer and intermediate probability V/Q scan; but her WELLS criteria is low and is presenting w/o fever and no CP. Please refer to H&P written by Dr. Loleta Books for further info/details on admission.  Plan: -will continue supportive care -will start pulmicort and flutter valve -continue current antibiotics -will check LE duplex as per PIOPED protocol to determine treatment for PE -follow clinical response  Barton Dubois [id number]

## 2015-07-09 NOTE — ED Notes (Signed)
CRITICAL VALUE ALERT  Critical value received:  Potassium 6.1  Date of notification:  07/09/2015  Time of notification:  0122  Critical value read back:Yes.    Nurse who received alert:  Toma Deiters  MD notified (1st page):  Dr Tomi Bamberger  Time of first page:  0122  MD notified (2nd page):  Time of second page:  Responding MD:  Dr Tomi Bamberger  Time MD responded:  [phone]

## 2015-07-09 NOTE — Evaluation (Signed)
Physical Therapy Evaluation Patient Details Name: [name] MRN: [id number] DOB: 1946/02/20 Today's Date: 07/09/2015   History of Present Illness  70 yo F admitted 07/09/2015 for increasing dyspnea and SOB.  In the ED she was found to be hypoxic at 77%, and placed on supplemental O2.  Pt was found to have elevated D-dimer, and troponins in the setting of COPD exacerbation compounded by CAP.   Clinical Impression  Pt received in the bed, and was agreeable to PT evaluation.  During mobility assessment it was clear that pt was fearful of mobility due to poor endurance, as well as possible fear of falling.  RN reported to PT today that this pt has a history of recent falls.  Will continue to gain more information on this during subsequent tx's.  During today's tx, pt was able to sit on the EOB for ~6 min before becoming fatigued and requesting to return to supine in bed. Pt refused to attempt transfer sit<>stand at this time, and states "I will fall if I try." Pt's mobility prior to admission was limited to using a RW for household ambulation and a manual w/c for community mobility.  Overall, she demonstrates a significant decrease in her PLOF, and would benefit from continued skilled PT to promote strength, endurance, gait and transfers to optimize her return to PLOF.  Pt will likely benefit from f/u with HHPT after d/c from acute care setting.     Follow Up Recommendations Home health PT    Equipment Recommendations  None recommended by PT (Pt already has DMA)    Recommendations for Other Services       Precautions / Restrictions Precautions Precautions: None      Mobility  Bed Mobility Overal bed mobility: Needs Assistance Bed Mobility: Supine to Sit     Supine to sit: Min assist;HOB elevated     General bed mobility comments: Pt was able to perform log roll to the left, and supine<>sit on L side of the bed.  Pt required increased time and rest break after raising trunk.  At end  of tx, pt required Mod A for supine scoot to get positioned in the bed.    Transfers   General transfer comment: Pt refused attempts at transfers despite education.  Pt is fearful that she will become incontinent of both bowel and bladder since she received suppositories in the ED.  Pt states she is incontinent of urine at baseline.   Ambulation/Gait             General Gait Details: Not assessed due to pt's fear of incontinence.              Pertinent Vitals/Pain Pain Assessment: 0-10 Pain Score: 4  Pain Location: back - chronic LBP "it just hurts" Pain Intervention(s): Repositioned    Home Living Family/patient expects to be discharged to:: Private residence Living Arrangements: Children (2 adult sons who are disabled due to muscular dystropy, but able to physically assist pt.  1 is in SNF for PNA currently. ) Available Help at Discharge: Family;Available 24 hours/day Type of Home: Mobile home (Pt states that they are in the process of looking for a handicapped accessible house. ) Home Access: Ramped entrance     Home Layout: One level Home Equipment: [place] - 2 wheels;Bedside commode;Shower seat;Wheelchair - manual      Prior Function Level of Independence: Independent with assistive device(s)    Comments: Pt states that it is very difficult for her to perform  her ADL's and it takes a long time.  Pt uses a RW for household mobility, and a manual w/c in the community.  Pt's son drives the pt to run errands due to difficuty with vision in the left eye.      Hand Dominance        Extremity/Trunk Assessment   Upper Extremity Assessment: Generalized weakness           Lower Extremity Assessment: Generalized weakness      Cervical / Trunk Assessment: Kyphotic  Communication   Communication: No difficulties  Cognition Arousal/Alertness: Awake/alert Behavior During Therapy: WFL for tasks assessed/performed Overall Cognitive Status: Within Functional  Limits for tasks assessed        General Comments General comments (skin integrity, edema, etc.): Pt required supervision while sitting at the EOB with back unsupported.  Pt demonstrates need to use B UE support with dynamic activities in sitting.     Exercises General Exercises - Lower Extremity Short Arc Quad: AROM;Both;10 reps Hip Flexion/Marching: AROM;Both;10 reps      Assessment/Plan    PT Assessment Patient needs continued PT services  PT Diagnosis Difficulty walking;Generalized weakness   PT Problem List Decreased strength;Decreased activity tolerance;Decreased balance;Decreased mobility;Decreased knowledge of use of DME;Decreased safety awareness;Cardiopulmonary status limiting activity  PT Treatment Interventions Gait training;Functional mobility training;Therapeutic activities;Therapeutic exercise;Balance training;Patient/family education   PT Goals (Current goals can be found in the Care Plan section) Acute Rehab PT Goals Patient Stated Goal: To get strong enough to go home  PT Goal Formulation: With patient Time For Goal Achievement: 07/23/15 Potential to Achieve Goals: Good    Frequency Min 3X/week   Barriers to discharge Other (comment) Pt lives with her 2 sons who both also have muscular dystrophy, and one is currently being treated in a rehab facility as he is recovering from PNA        End of Session   Activity Tolerance: Patient limited by fatigue Patient left: in bed;with chair alarm set      Functional Assessment Tool Used: Clinical Judgement Functional Limitation: Mobility: Walking and moving around Mobility: Walking and Moving Around Current Status [id number]): At least 60 percent but less than 80 percent impaired, limited or restricted Mobility: Walking and Moving Around Goal Status [phone]): At least 20 percent but less than 40 percent impaired, limited or restricted    Time: 1541-1602 PT Time Calculation (min) (ACUTE ONLY): 21 min   Charges:    PT Evaluation $PT Eval High Complexity: 1 Procedure     PT G Codes:   PT G-Codes **NOT FOR INPATIENT CLASS** Functional Assessment Tool Used: Clinical Judgement Functional Limitation: Mobility: Walking and moving around Mobility: Walking and Moving Around Current Status [id number]): At least 60 percent but less than 80 percent impaired, limited or restricted Mobility: Walking and Moving Around Goal Status [phone]): At least 20 percent but less than 40 percent impaired, limited or restricted    Ladoris Gene 07/09/2015, 7:56 PM

## 2015-07-10 DIAGNOSIS — J9621 Acute and chronic respiratory failure with hypoxia: Secondary | ICD-10-CM

## 2015-07-10 DIAGNOSIS — J441 Chronic obstructive pulmonary disease with (acute) exacerbation: Secondary | ICD-10-CM

## 2015-07-10 LAB — CBC
HEMATOCRIT: 43.6 % (ref 36.0–46.0)
HEMOGLOBIN: 12.5 g/dL (ref 12.0–15.0)
MCH: 24.7 pg — AB (ref 26.0–34.0)
MCHC: 28.7 g/dL — ABNORMAL LOW (ref 30.0–36.0)
MCV: 86.2 fL (ref 78.0–100.0)
Platelets: 171 10*3/uL (ref 150–400)
RBC: 5.06 MIL/uL (ref 3.87–5.11)
RDW: 21 % — ABNORMAL HIGH (ref 11.5–15.5)
WBC: 7.1 10*3/uL (ref 4.0–10.5)

## 2015-07-10 LAB — URINALYSIS, ROUTINE W REFLEX MICROSCOPIC
BILIRUBIN URINE: NEGATIVE
Glucose, UA: NEGATIVE mg/dL
Hgb urine dipstick: NEGATIVE
KETONES UR: NEGATIVE mg/dL
LEUKOCYTES UA: NEGATIVE
NITRITE: NEGATIVE
PROTEIN: NEGATIVE mg/dL
SPECIFIC GRAVITY, URINE: 1.015 (ref 1.005–1.030)
pH: 6 (ref 5.0–8.0)

## 2015-07-10 LAB — BASIC METABOLIC PANEL
ANION GAP: 7 (ref 5–15)
BUN: 18 mg/dL (ref 6–20)
CHLORIDE: 105 mmol/L (ref 101–111)
CO2: 30 mmol/L (ref 22–32)
Calcium: 9.6 mg/dL (ref 8.9–10.3)
Creatinine, Ser: 1.11 mg/dL — ABNORMAL HIGH (ref 0.44–1.00)
GFR calc Af Amer: 57 mL/min — ABNORMAL LOW (ref >60)
GFR calc non Af Amer: 49 mL/min — ABNORMAL LOW (ref >60)
GLUCOSE: 130 mg/dL — AB (ref 65–99)
POTASSIUM: 4.5 mmol/L (ref 3.5–5.1)
Sodium: 142 mmol/L (ref 135–145)

## 2015-07-10 LAB — CREATININE, URINE, RANDOM: Creatinine, Urine: 57.42 mg/dL

## 2015-07-10 LAB — SODIUM, URINE, RANDOM: Sodium, Ur: 109 mmol/L

## 2015-07-10 MED ORDER — ENOXAPARIN SODIUM 40 MG/0.4ML ~~LOC~~ SOLN
40.0000 mg | SUBCUTANEOUS | Status: DC
  Administered 2015-07-11: 40 mg via SUBCUTANEOUS
  Filled 2015-07-10: qty 0.4

## 2015-07-10 NOTE — Progress Notes (Signed)
Patient asleep on 1lpm Saturations 95

## 2015-07-10 NOTE — Care Management Important Message (Signed)
Important Message  Patient Details  Name: INETTE HARKLESS MRN: [id number] Date of Birth: June 25, 1945   Medicare Important Message Given:  Yes    Alvie Heidelberg, RN 07/10/2015, 8:39 AM

## 2015-07-10 NOTE — Progress Notes (Signed)
Placed on Nocturnal Sleep / pulse ox ---oxygen turned off. CPAP not in use. Time 2115

## 2015-07-10 NOTE — Progress Notes (Signed)
Pt refuse to wear CPAP machine and states she can't sleep and just wants to wear her oxygen...placed back on 3lpm cann

## 2015-07-10 NOTE — Progress Notes (Signed)
Found patient asleep at 1920 on room air saturation 88. Schedule for Night pulse ox

## 2015-07-10 NOTE — Care Management Note (Signed)
Case Management Note  Patient Details  Name: [name] MRN: [id number] Date of Birth: Oct 21, 1945  Subjective/Objective:    Spoke with patient fordischarge planning patient is from home with sons.  Patient stated that she absolutely did not want to go to SNF for reahb and initially did not want home health  After discussing why she stated that the agency she had the last time was rude to her. She stated that it was the one in [place]. [facility].   I offered choice of other providers and she agree upon letting [facility] Nurses come out.  Referral placed with Edwina at [facility].    Patient will need O2 screen.          Action/Plan: Home with Home Health.    Expected Discharge Date:  07/13/15               Expected Discharge Plan:  [place]  In-House Referral:     Discharge planning Services  CM Consult  Post Acute Care Choice:    Choice offered to:     DME Arranged:    DME Agency:  [place]  HH Arranged:  RN, PT, Nurse's Aide [facility] Agency:     Status of Service:  In process, will continue to follow  Medicare Important Message Given:  Yes Date Medicare IM Given:    Medicare IM give by:    Date Additional Medicare IM Given:    Additional Medicare Important Message give by:     If discussed at [place] of Stay Meetings, dates discussed:    Additional Comments:  Alvie Heidelberg, RN 07/10/2015, 3:45 PM

## 2015-07-10 NOTE — Progress Notes (Addendum)
Patient on 1.5lpm asleep, Saturations 96  Time 2321. Patient placed on 1.5 liter around 2200 after about 45 of sleeping with oxygen off. Saturations had trended from 85- 88 for about 45 minutes while sleeping.

## 2015-07-10 NOTE — Progress Notes (Signed)
SATURATION QUALIFICATIONS: (This note is used to comply with regulatory documentation for home oxygen)  Patient Saturations on Room Air at Rest = 92%  Patient Saturations on Room Air while Ambulating = 90%  Patient Saturations on  Liters of oxygen while Ambulating = %  Please briefly explain why patient needs home oxygen:  Pt does not qualify for home oxygen at this time.

## 2015-07-10 NOTE — Progress Notes (Signed)
[facility] PROGRESS NOTE  [name] [id number] DOB: 11/25/1945 DOA: 07/09/2015 PCP: Robert Bellow, MD  Assessment/Plan: 1. Acute hypoxic resp failure: due to COPD exacerbation and CAP  -Ceftriaxone and doxycycline for CAP -Solumedrol and bronchodilators as below for COPD -Repeat 2V chest x-ray in am -Incentive spirometry/flutter valve, pulmicort and supplemental O2 as needed -patient is afebrile and improving, will follow clinical response   2. Hyperkalemia:  -from recent use of BACTRIM and AKI -received Kayexalate, bicarb and insulin/D50 on admission. -potassium level WNL now -ok and safe to d/c telemetry  3. COPD with exacerbation:  -Solumedrol 40 mg BID -Nebulized albuterol scheduled and PRN -started on flutter valve and pulmicort -will continue current abx's and weaned O2 supplementation as tolerated   4. Elevated troponin:  -flat elevation and no CP -most likely due to renal injury   5. AKI:  -most likely from dehydration and use of BACTRIM -Fluids provided and Cr essentially back to normal  -will follow up renal function trend  -BMET in am  -no signs of UTI on UA  6. Peripheral vascular disease and hypertension:  -Continue aspirin  7. Paroxysmal A. fib with pacer:  CHADS2-VASc 3. Not on warfarin due to patient preference after GI bleed. -has remained stable and with rate controlled for 48 hours now -will d/c telemetry   8. Elevated d-dimer: -most likely from renal impairment  -no CP, no DVT on duplex and low WELLS criteria; patient with intermediate probability for PE on V/Q scan; but after following PIOPED pathway no treatment granted.  Code Status: Full Family Communication: no family at bedside today (Son informed of plan and course on 4/6) Disposition Plan: will assess home oxygenation needs, continue supportive care and tx for COPD exacerbation. Most likely home in 24-48 hours with [facility] services.   Consultants:  None    Procedures:  V/Q scan: intermediate probability test  LE duplex: neg for DVT  Antibiotics:  Rocephin and doxycycline 4/6  HPI/Subjective: Afebrile currently; denies CP and endorses improvement in SOB. Able to speak in full sentences today; but still with need of O2 supplementation to maintain oxygenation.  Objective: Filed Vitals:   07/10/15 0500 07/10/15 1520  BP: 150/60 136/55  Pulse: 69 71  Temp: 98.6 F (37 C)   Resp:  20    Intake/Output Summary (Last 24 hours) at 07/10/15 1617 Last data filed at 07/10/15 1216  Gross per 24 hour  Intake    440 ml  Output      2 ml  Net    438 ml   Filed Weights   07/09/15 0018 07/09/15 0400 07/09/15 0529  Weight: 72.576 kg (160 lb) 67 kg (147 lb 11.3 oz) 66.996 kg (147 lb 11.2 oz)    Exam:   General:  Afebrile, denies CP and reports breathing is better. Complaining of some lower back pain (which is chronic). Still requiring O2 supplementation, but able to speak in full sentences today.  Cardiovascular: S1 and s2, positive murmur, no rubs or gallops  Respiratory: scattered rhonchi, positive end exp wheezing; no crackles. Fair air movement   Abdomen: soft, NT, ND, positive BS  Musculoskeletal: no edema or cyanosis   Data Reviewed: Basic Metabolic Panel:  Recent Labs Lab 07/09/15 0040 07/09/15 0358 07/10/15 0556  NA 138 141 142  K CRITICAL RESULT CALLED TO, READ BACK BY AND VERIFIED WITH: 5.0 4.5  CL 106 109 105  CO2 25 25 30   GLUCOSE 131* 237* 130*  BUN 36* 34* 18  CREATININE 1.94* 1.84* 1.11*  CALCIUM 10.0 9.7 9.6   Liver Function Tests:  Recent Labs Lab 07/09/15 0040  AST 19  ALT 14  ALKPHOS 80  BILITOT 0.3  PROT 7.1  ALBUMIN 3.6   CBC:  Recent Labs Lab 07/09/15 0040 07/09/15 0358 07/10/15 0556  WBC 11.7* 9.3 7.1  NEUTROABS 7.4  --   --   HGB 13.8 13.2 12.5  HCT 46.0 45.2 43.6  MCV 84.9 85.9 86.2  PLT 194 149* 171   Cardiac Enzymes:  Recent Labs Lab 07/09/15 0040 07/09/15 0358  07/09/15 0948 07/09/15 1525  TROPONINI 0.05* 0.05* 0.04* 0.04*   BNP (last 3 results)  Recent Labs  08/25/14 1710 07/09/15 0040  BNP 886.0* 636.0*   CBG:  Recent Labs Lab 07/09/15 0719 07/09/15 1205  GLUCAP 209* 251*    Recent Results (from the past 240 hour(s))  MRSA PCR Screening     Status: None   Collection Time: 07/09/15  3:44 AM  Result Value Ref Range Status   MRSA by PCR NEGATIVE NEGATIVE Final    Comment:        The GeneXpert MRSA Assay (FDA approved for NASAL specimens only), is one component of a comprehensive MRSA colonization surveillance program. It is not intended to diagnose MRSA infection nor to guide or monitor treatment for MRSA infections.      Studies: Nm Pulmonary Perf And Vent  07/09/2015  CLINICAL DATA:  Shortness of breath increased over past few weeks, chronic pneumonia, elevated D-dimer, history diabetes mellitus, smoking, paroxysmal atrial fibrillation, COPD EXAM: NUCLEAR MEDICINE VENTILATION - PERFUSION LUNG SCAN TECHNIQUE: Ventilation images were obtained in multiple projections using inhaled aerosol Tc-47m DTPA. Perfusion images were obtained in multiple projections after intravenous injection of Tc-20m MAA. RADIOPHARMACEUTICALS:  30 mCi Technetium-30m DTPA aerosol inhalation and 3.1 mCi Technetium-52m MAA IV COMPARISON:  None Correlation:  Chest radiograph 07/09/2015 FINDINGS: Ventilation: Central airway and oropharyngeal deposition of tracer. Swallowed aerosol within stomach. Enlargement of cardiac silhouette. No other ventilatory abnormalities. Perfusion: Photopenic defect LEFT apex due to pacemaker generator. Moderate perfusion defect medial basal segment RIGHT lower lobe best appreciated on RPO view, with normal ventilation at this site. No other focal perfusion defects. Chest radiograph demonstrates LEFT lower lobe atelectasis. Moderate-sized perfusion defect medial basal segment RIGHT lower lobe with normal ventilation representing an  intermediate probability for pulmonary embolism. Electronically Signed   By: Lavonia Dana M.D.   On: 07/09/2015 12:40   US Venous Img Lower Bilateral  07/09/2015  CLINICAL DATA:  PE.  Short of breath.  Elevated D-dimer. EXAM: BILATERAL LOWER EXTREMITY VENOUS DUPLEX ULTRASOUND TECHNIQUE: Doppler venous assessment of the bilateral lower extremity deep venous system was performed, including characterization of spectral flow, compressibility, and phasicity. COMPARISON:  None. FINDINGS: There is complete compressibility of the bilateral common femoral, femoral, and popliteal vein. Doppler analysis demonstrates respiratory phasicity and augmentation of flow. IMPRESSION: No evidence of lower extremity DVT. Electronically Signed   By: Marybelle Killings M.D.   On: 07/09/2015 16:30   Dg Chest Port 1 View  07/09/2015  CLINICAL DATA:  Productive cough.  Worsening fatigue.  Dyspnea. EXAM: PORTABLE CHEST 1 VIEW COMPARISON:  08/25/2014 FINDINGS: There is consolidation in the left base, and a small left effusion. This may represent pneumonia. The right lung is clear. There is moderate unchanged cardiomegaly. There are intact appearances of the dual-lumen transvenous cardiac leads. Pulmonary vasculature is normal. IMPRESSION: Left base consolidation and effusion. Probable pneumonia. Recommend follow-up radiography after treatment, 3  6 weeks, to confirm clearance and exclude neoplasm. Electronically Signed   By: Andreas Newport M.D.   On: 07/09/2015 01:02    Scheduled Meds: . albuterol  2.5 mg Nebulization Q6H  . aspirin EC  81 mg Oral Daily  . budesonide (PULMICORT) nebulizer solution  0.25 mg Nebulization BID  . cefTRIAXone (ROCEPHIN)  IV  1 g Intravenous Q24H  . darifenacin  7.5 mg Oral Daily  . docusate sodium  100 mg Oral Daily  . doxycycline  100 mg Oral Q12H  . [START ON 07/11/2015] enoxaparin (LOVENOX) injection  40 mg Subcutaneous Q24H  . methylPREDNISolone (SOLU-MEDROL) injection  40 mg Intravenous Daily  .  sodium chloride flush  3 mL Intravenous Q12H   Continuous Infusions:   Principal Problem:   Acute respiratory failure with hypoxia and hypercarbia (HCC) Active Problems:   Claudication in peripheral vascular disease (HCC)   Myotonic dystrophy (HCC)   Cardiac pacemaker in situ   COPD (chronic obstructive pulmonary disease) (HCC)   Paroxysmal atrial fibrillation (HCC)   Pacemaker   Elevated troponin   AKI (acute kidney injury) ([place])   Hyperkalemia    Time spent: 35 minutes    Barton Dubois  [facility] Pager [phone]. If 7PM-7AM, please contact night-coverage at [email or web address], password [facility] 07/10/2015, 4:17 PM  LOS: 1 day

## 2015-07-11 ENCOUNTER — Inpatient Hospital Stay (HOSPITAL_COMMUNITY): Payer: Medicare Other

## 2015-07-11 DIAGNOSIS — J9601 Acute respiratory failure with hypoxia: Secondary | ICD-10-CM

## 2015-07-11 DIAGNOSIS — J9602 Acute respiratory failure with hypercapnia: Secondary | ICD-10-CM

## 2015-07-11 DIAGNOSIS — N179 Acute kidney failure, unspecified: Secondary | ICD-10-CM

## 2015-07-11 LAB — BASIC METABOLIC PANEL
Anion gap: 8 (ref 5–15)
BUN: 19 mg/dL (ref 6–20)
CHLORIDE: 105 mmol/L (ref 101–111)
CO2: 28 mmol/L (ref 22–32)
CREATININE: 0.9 mg/dL (ref 0.44–1.00)
Calcium: 9.2 mg/dL (ref 8.9–10.3)
GFR calc Af Amer: >60 mL/min (ref >60)
GFR calc non Af Amer: >60 mL/min (ref >60)
GLUCOSE: 96 mg/dL (ref 65–99)
POTASSIUM: 4.4 mmol/L (ref 3.5–5.1)
Sodium: 141 mmol/L (ref 135–145)

## 2015-07-11 LAB — PROCALCITONIN: Procalcitonin: <0.1 ng/mL

## 2015-07-11 LAB — STREP PNEUMONIAE URINARY ANTIGEN: STREP PNEUMO URINARY ANTIGEN: NEGATIVE

## 2015-07-11 LAB — GLUCOSE, CAPILLARY: GLUCOSE-CAPILLARY: 88 mg/dL (ref 65–99)

## 2015-07-11 MED ORDER — OXYCODONE HCL 5 MG PO TABS
5.0000 mg | ORAL_TABLET | Freq: Once | ORAL | Status: AC
Start: 2015-07-11 — End: 2015-07-11
  Administered 2015-07-11: 5 mg via ORAL
  Filled 2015-07-11: qty 1

## 2015-07-11 MED ORDER — LEVOFLOXACIN 750 MG PO TABS: 750.0000 mg | ORAL_TABLET | Freq: Every day | ORAL | Status: DC

## 2015-07-11 MED ORDER — PREDNISONE 10 MG PO TABS: 10.0000 mg | ORAL_TABLET | Freq: Every day | ORAL | Status: DC

## 2015-07-11 NOTE — Discharge Summary (Signed)
Physician Discharge Summary  [name] [id number] DOB: [birth year] DOA: 07/09/2015  PCP: Melinda Bellow, MD  Admit date: 07/09/2015 Discharge date: 07/11/2015  Time spent: 45 minutes  Recommendations for Outpatient Follow-up:  -Will be discharged home today. -Does require oxygen at nighttime for saturations of 84-89%. -Advised to follow-up with primary care provider in 2 weeks. -Home health PT will be arranged.   Discharge Diagnoses:  Principal Problem:   Acute respiratory failure with hypoxia and hypercarbia (HCC) Active Problems:   Claudication in peripheral vascular disease (HCC)   Myotonic dystrophy ([place])   Cardiac pacemaker in situ   COPD (chronic obstructive pulmonary disease) (HCC)   Paroxysmal atrial fibrillation (HCC)   Pacemaker   Elevated troponin   AKI (acute kidney injury) ([place])   Hyperkalemia   Discharge Condition: Stable and improved  Filed Weights   07/09/15 0018 07/09/15 0400 07/09/15 0529  Weight: 72.576 kg (160 lb) 67 kg (147 lb 11.3 oz) 66.996 kg (147 lb 11.2 oz)    History of present illness:  As per Dr. Loleta Books on 4/6: [name] is a 70 y.o. female with a past medical history significant for pAF not on warfarin, pacemaker, PVD, COPD and smoking who presents with cough and dyspnea.  The patient started noticing acute on chronic dyspnea, cough, and "choking up" about three days ago. This worsened over the next few days, was associated with decreased appetite and increased weakness/fatigue, until she came to the ER today.  In the ED, she was hypoxic to 77% on RA, afebrile and hemodynamically stable. Na 138, K 6.1, Cr 1.94 (from baseline around 1), WBC 11.7K, BNP 636 (below previous), troponin 0.05, d-dimer elevated. 1 view CXR showed a questionable L base opacity and ECG showed paced rhythm. She was administered bronchodilators and IV antibiotics and steroids for presumed COPD flare and CAP, and TRH were asked to evaluate for  admission.  Of note, she was diagnosed with UTI one week ago and has been on Bactrim DS BID for the last week.   Hospital Course:   . Acute hypoxic resp failure: due to COPD exacerbation and CAP  -We'll be discharge on 5 days of Levaquin for her community-acquired pneumonia. -Prednisone taper for COPD exacerbation. --patient is afebrile and improving, -Has required oxygen at nighttime for desaturations into the mid to upper 80s. This will be arranged prior to discharge as well as some home health physical therapy.  2. Hyperkalemia:  -from recent use of BACTRIM and AKI -received Kayexalate, bicarb and insulin/D50 on admission. -potassium level WNL now  3. COPD with exacerbation:  -Continue prednisone taper on discharge. -Nebulized albuterol scheduled and PRN  4. Elevated troponin:  -flat elevation and no CP -most likely due to renal injury   5. AKI:  -most likely from dehydration and use of BACTRIM -Creatinine down to normal at 0.9 at time of discharge.  6. Peripheral vascular disease and hypertension:  -Continue aspirin  7. Paroxysmal A. fib with pacer:  CHADS2-VASc 3. Not on warfarin due to patient preference after GI bleed. -has been rate controlled throughout this hospitalization.   8. Elevated d-dimer: -most likely from renal impairment  -no CP, no DVT on duplex and low WELLS criteria; patient with intermediate probability for PE on V/Q scan; but after following PIOPED pathway no treatment warranted.   Procedures:  None   Consultations:  None  Discharge Instructions  Discharge Instructions    Diet - low sodium heart healthy    Complete by:  As directed      Increase activity slowly    Complete by:  As directed             Medication List    TAKE these medications        albuterol (2.5 MG/3ML) 0.083% nebulizer solution  Commonly known as:  PROVENTIL  Take 2.5 mg by nebulization every 6 (six) hours as needed for wheezing or shortness of  breath.     aspirin 81 MG tablet  Take 1 tablet (81 mg total) by mouth daily.     CENTRUM SILVER ULTRA WOMENS Tabs  Take 1 tablet by mouth daily.     guaiFENesin 600 MG 12 hr tablet  Commonly known as:  MUCINEX  Take 1 tablet (600 mg total) by mouth 2 (two) times daily.     levofloxacin 750 MG tablet  Commonly known as:  LEVAQUIN  Take 1 tablet (750 mg total) by mouth daily.     oxyCODONE-acetaminophen 10-325 MG tablet  Commonly known as:  PERCOCET  Take 1 tablet by mouth 4 (four) times daily as needed for pain.     predniSONE 10 MG tablet  Commonly known as:  DELTASONE  Take 1 tablet (10 mg total) by mouth daily with breakfast. Take 6 tablets today and then decrease by 1 tablet daily until none are left.     senna-docusate 8.6-50 MG tablet  Commonly known as:  Senokot-S  Take 2 tablets by mouth daily as needed for mild constipation.     solifenacin 5 MG tablet  Commonly known as:  VESICARE  Take 5 mg by mouth daily.       Allergies  Allergen Reactions  . Prednisone Other (See Comments)    Chest pain       Follow-up Information    Follow up with Melinda Bellow, MD. Schedule an appointment as soon as possible for a visit in 2 weeks.   Specialty:  Family Medicine   Contact information:   [place] [id number] [phone]        The results of significant diagnostics from this hospitalization (including imaging, microbiology, ancillary and laboratory) are listed below for reference.    Significant Diagnostic Studies: Nm Pulmonary Perf And Vent  07/09/2015  CLINICAL DATA:  Shortness of breath increased over past few weeks, chronic pneumonia, elevated D-dimer, history diabetes mellitus, smoking, paroxysmal atrial fibrillation, COPD EXAM: NUCLEAR MEDICINE VENTILATION - PERFUSION LUNG SCAN TECHNIQUE: Ventilation images were obtained in multiple projections using inhaled aerosol Tc-70m DTPA. Perfusion images were obtained in multiple projections  after intravenous injection of Tc-41m MAA. RADIOPHARMACEUTICALS:  30 mCi Technetium-8m DTPA aerosol inhalation and 3.1 mCi Technetium-72m MAA IV COMPARISON:  None Correlation:  Chest radiograph 07/09/2015 FINDINGS: Ventilation: Central airway and oropharyngeal deposition of tracer. Swallowed aerosol within stomach. Enlargement of cardiac silhouette. No other ventilatory abnormalities. Perfusion: Photopenic defect LEFT apex due to pacemaker generator. Moderate perfusion defect medial basal segment RIGHT lower lobe best appreciated on RPO view, with normal ventilation at this site. No other focal perfusion defects. Chest radiograph demonstrates LEFT lower lobe atelectasis. Moderate-sized perfusion defect medial basal segment RIGHT lower lobe with normal ventilation representing an intermediate probability for pulmonary embolism. Electronically Signed   By: Lavonia Dana M.D.   On: 07/09/2015 12:40   US Venous Img Lower Bilateral  07/09/2015  CLINICAL DATA:  PE.  Short of breath.  Elevated D-dimer. EXAM: BILATERAL LOWER EXTREMITY VENOUS DUPLEX ULTRASOUND TECHNIQUE: Doppler venous assessment of the bilateral  lower extremity deep venous system was performed, including characterization of spectral flow, compressibility, and phasicity. COMPARISON:  None. FINDINGS: There is complete compressibility of the bilateral common femoral, femoral, and popliteal vein. Doppler analysis demonstrates respiratory phasicity and augmentation of flow. IMPRESSION: No evidence of lower extremity DVT. Electronically Signed   By: Marybelle Killings M.D.   On: 07/09/2015 16:30   Dg Chest Port 1 View  07/11/2015  CLINICAL DATA:  Followup left lower lobe pneumonia. EXAM: PORTABLE CHEST 1 VIEW COMPARISON:  07/09/2015 and earlier, including CT chest 08/02/2013. FINDINGS: Cardiac silhouette markedly enlarged. Left subclavian dual lead transvenous pacemaker unchanged. Mild pulmonary venous hypertension, increased since the examination 2 days ago,  without overt edema currently. Consolidation in the left lower lobe, with slight improvement in aeration. Stable small left pleural effusion. Mild right basilar atelectasis, slightly increased since examination 2 days ago. IMPRESSION: 1. Left lower lobe pneumonia with slight improved aeration since the examination 2 days ago. 2. Stable small left parapneumonic effusion. 3. New mild right basilar atelectasis. 4. Stable cardiomegaly. Pulmonary venous hypertension, slightly increased since the examination 2 days ago, without overt edema currently. Electronically Signed   By: Evangeline Dakin M.D.   On: 07/11/2015 10:36   Dg Chest Port 1 View  07/09/2015  CLINICAL DATA:  Productive cough.  Worsening fatigue.  Dyspnea. EXAM: PORTABLE CHEST 1 VIEW COMPARISON:  08/25/2014 FINDINGS: There is consolidation in the left base, and a small left effusion. This may represent pneumonia. The right lung is clear. There is moderate unchanged cardiomegaly. There are intact appearances of the dual-lumen transvenous cardiac leads. Pulmonary vasculature is normal. IMPRESSION: Left base consolidation and effusion. Probable pneumonia. Recommend follow-up radiography after treatment, 3 6 weeks, to confirm clearance and exclude neoplasm. Electronically Signed   By: Andreas Newport M.D.   On: 07/09/2015 01:02    Microbiology: Recent Results (from the past 240 hour(s))  MRSA PCR Screening     Status: None   Collection Time: 07/09/15  3:44 AM  Result Value Ref Range Status   MRSA by PCR NEGATIVE NEGATIVE Final    Comment:        The GeneXpert MRSA Assay (FDA approved for NASAL specimens only), is one component of a comprehensive MRSA colonization surveillance program. It is not intended to diagnose MRSA infection nor to guide or monitor treatment for MRSA infections.      Labs: Basic Metabolic Panel:  Recent Labs Lab 07/09/15 0040 07/09/15 0358 07/10/15 0556 07/11/15 0646  NA 138 141 142 141  K CRITICAL RESULT  CALLED TO, READ BACK BY AND VERIFIED WITH: 5.0 4.5 4.4  CL 106 109 105 105  CO2 25 25 30 28   GLUCOSE 131* 237* 130* 96  BUN 36* 34* 18 19  CREATININE 1.94* 1.84* 1.11* 0.90  CALCIUM 10.0 9.7 9.6 9.2   Liver Function Tests:  Recent Labs Lab 07/09/15 0040  AST 19  ALT 14  ALKPHOS 80  BILITOT 0.3  PROT 7.1  ALBUMIN 3.6   No results for input(s): LIPASE, AMYLASE in the last 168 hours. No results for input(s): AMMONIA in the last 168 hours. CBC:  Recent Labs Lab 07/09/15 0040 07/09/15 0358 07/10/15 0556  WBC 11.7* 9.3 7.1  NEUTROABS 7.4  --   --   HGB 13.8 13.2 12.5  HCT 46.0 45.2 43.6  MCV 84.9 85.9 86.2  PLT 194 149* 171   Cardiac Enzymes:  Recent Labs Lab 07/09/15 0040 07/09/15 0358 07/09/15 0948 07/09/15 1525  TROPONINI 0.05*  0.05* 0.04* 0.04*   BNP: BNP (last 3 results)  Recent Labs  08/25/14 1710 07/09/15 0040  BNP 886.0* 636.0*    ProBNP (last 3 results) No results for input(s): PROBNP in the last 8760 hours.  CBG:  Recent Labs Lab 07/09/15 0719 07/09/15 1205 07/11/15 0539  GLUCAP 209* 251* 88       Signed:  HERNANDEZ ACOSTA,Taquan Bralley  [facility] Pager: [phone] 07/11/2015, 10:46 AM

## 2015-07-11 NOTE — Care Management (Signed)
CM followed up with [facility] regarding the delivery status for the oxygen. Spoke with Misty and delivery window time will be between 3pm to 7pm.

## 2015-07-11 NOTE — Progress Notes (Signed)
Patient on 1 liter oxygen saturation 94. Treatment given 0148 at which time saturations increased to 100%. Patient back on 1 liter.

## 2015-07-11 NOTE — Progress Notes (Addendum)
CM faxed paper to [facility] for the oxygen to be delivered to hospital.  Faxed information to [facility] to arrange for [facility] services for PT and RN.  CM spoke with Seth Bake at [facility] regarding the order for the oxygen.  Information corrected to have 2 liters continuous stationary/portable oxygen delivered.  Refaxed information for processing.  CM followed up with [facility] services with Alvis Lemmings and spoke with Jinny Blossom and she confirmed that the [facility] services will start on April 12th.  Family has communicated with staff at [facility] regarding her referral.

## 2015-07-11 NOTE — Progress Notes (Addendum)
Patient 89 on 1lpm oxygen at 0425 am stoping pulse ox.

## 2015-07-11 NOTE — Progress Notes (Signed)
Patient may qualify for oxygen at hours of sleep. Her saturations decreased from 85 - 89 while sleeping for at least 45 minutes. She was placed on 1 lpm oxygen and saturation increased to 92-94 while sleeping.  She is not compliant with CPAP and states she cannot breath with it.  She was followed through out night till about 4 am.

## 2015-07-11 NOTE — Progress Notes (Signed)
Patient's oxygen level on room air at rest is 79%

## 2015-07-13 LAB — URINE CULTURE: Culture: NO GROWTH

## 2015-07-22 ENCOUNTER — Telehealth (HOSPITAL_COMMUNITY): Payer: Self-pay

## 2015-07-22 NOTE — Telephone Encounter (Signed)
Patient dont want to make an appt until she talk to her MD. I spoke with [name] at MD office about giving the patient a call.

## 2015-07-24 ENCOUNTER — Encounter (HOSPITAL_COMMUNITY): Payer: Self-pay

## 2015-07-24 ENCOUNTER — Emergency Department (HOSPITAL_COMMUNITY): Payer: Medicare Other

## 2015-07-24 ENCOUNTER — Inpatient Hospital Stay (HOSPITAL_COMMUNITY)
Admission: EM | Admit: 2015-07-24 | Discharge: 2015-07-28 | DRG: 177 | Disposition: A | Discharge status: Home Health | Payer: Medicare Other | Attending: Internal Medicine | Admitting: Internal Medicine

## 2015-07-24 DIAGNOSIS — J441 Chronic obstructive pulmonary disease with (acute) exacerbation: Secondary | ICD-10-CM | POA: Diagnosis not present

## 2015-07-24 DIAGNOSIS — I2699 Other pulmonary embolism without acute cor pulmonale: Secondary | ICD-10-CM | POA: Diagnosis present

## 2015-07-24 DIAGNOSIS — E118 Type 2 diabetes mellitus with unspecified complications: Secondary | ICD-10-CM

## 2015-07-24 DIAGNOSIS — E1151 Type 2 diabetes mellitus with diabetic peripheral angiopathy without gangrene: Secondary | ICD-10-CM | POA: Diagnosis present

## 2015-07-24 DIAGNOSIS — I480 Paroxysmal atrial fibrillation: Secondary | ICD-10-CM | POA: Diagnosis present

## 2015-07-24 DIAGNOSIS — J9621 Acute and chronic respiratory failure with hypoxia: Secondary | ICD-10-CM | POA: Diagnosis present

## 2015-07-24 DIAGNOSIS — T17990A Other foreign object in respiratory tract, part unspecified in causing asphyxiation, initial encounter: Secondary | ICD-10-CM | POA: Diagnosis present

## 2015-07-24 DIAGNOSIS — J690 Pneumonitis due to inhalation of food and vomit: Secondary | ICD-10-CM | POA: Diagnosis not present

## 2015-07-24 DIAGNOSIS — X58XXXA Exposure to other specified factors, initial encounter: Secondary | ICD-10-CM | POA: Diagnosis present

## 2015-07-24 DIAGNOSIS — Z9981 Dependence on supplemental oxygen: Secondary | ICD-10-CM

## 2015-07-24 DIAGNOSIS — N289 Disorder of kidney and ureter, unspecified: Secondary | ICD-10-CM

## 2015-07-24 DIAGNOSIS — Z950 Presence of cardiac pacemaker: Secondary | ICD-10-CM

## 2015-07-24 DIAGNOSIS — E785 Hyperlipidemia, unspecified: Secondary | ICD-10-CM | POA: Diagnosis present

## 2015-07-24 DIAGNOSIS — R0902 Hypoxemia: Secondary | ICD-10-CM | POA: Diagnosis not present

## 2015-07-24 DIAGNOSIS — D696 Thrombocytopenia, unspecified: Secondary | ICD-10-CM | POA: Diagnosis present

## 2015-07-24 DIAGNOSIS — I82431 Acute embolism and thrombosis of right popliteal vein: Secondary | ICD-10-CM | POA: Diagnosis present

## 2015-07-24 DIAGNOSIS — I82409 Acute embolism and thrombosis of unspecified deep veins of unspecified lower extremity: Secondary | ICD-10-CM

## 2015-07-24 DIAGNOSIS — R911 Solitary pulmonary nodule: Secondary | ICD-10-CM | POA: Diagnosis present

## 2015-07-24 DIAGNOSIS — J9601 Acute respiratory failure with hypoxia: Secondary | ICD-10-CM | POA: Diagnosis present

## 2015-07-24 DIAGNOSIS — G4733 Obstructive sleep apnea (adult) (pediatric): Secondary | ICD-10-CM | POA: Diagnosis present

## 2015-07-24 DIAGNOSIS — I495 Sick sinus syndrome: Secondary | ICD-10-CM | POA: Diagnosis present

## 2015-07-24 DIAGNOSIS — Z86711 Personal history of pulmonary embolism: Secondary | ICD-10-CM

## 2015-07-24 DIAGNOSIS — F17210 Nicotine dependence, cigarettes, uncomplicated: Secondary | ICD-10-CM | POA: Diagnosis present

## 2015-07-24 DIAGNOSIS — K219 Gastro-esophageal reflux disease without esophagitis: Secondary | ICD-10-CM | POA: Diagnosis present

## 2015-07-24 DIAGNOSIS — M5136 Other intervertebral disc degeneration, lumbar region: Secondary | ICD-10-CM | POA: Diagnosis present

## 2015-07-24 DIAGNOSIS — Z809 Family history of malignant neoplasm, unspecified: Secondary | ICD-10-CM

## 2015-07-24 HISTORY — DX: Chronic obstructive pulmonary disease, unspecified: J44.9

## 2015-07-24 HISTORY — DX: Dependence on supplemental oxygen: Z99.81

## 2015-07-24 LAB — CBC WITH DIFFERENTIAL/PLATELET
BASOS PCT: 0 %
Basophils Absolute: 0 10*3/uL (ref 0.0–0.1)
EOS ABS: 0.9 10*3/uL — AB (ref 0.0–0.7)
Eosinophils Relative: 10 %
HEMATOCRIT: 46.6 % — AB (ref 36.0–46.0)
HEMOGLOBIN: 13.8 g/dL (ref 12.0–15.0)
Lymphocytes Relative: 28 %
Lymphs Abs: 2.5 10*3/uL (ref 0.7–4.0)
MCH: 25.4 pg — ABNORMAL LOW (ref 26.0–34.0)
MCHC: 29.6 g/dL — AB (ref 30.0–36.0)
MCV: 85.7 fL (ref 78.0–100.0)
MONOS PCT: 7 %
Monocytes Absolute: 0.6 10*3/uL (ref 0.1–1.0)
NEUTROS ABS: 4.9 10*3/uL (ref 1.7–7.7)
NEUTROS PCT: 55 %
Platelets: 94 10*3/uL — ABNORMAL LOW (ref 150–400)
RBC: 5.44 MIL/uL — AB (ref 3.87–5.11)
RDW: 20.8 % — ABNORMAL HIGH (ref 11.5–15.5)
WBC: 9 10*3/uL (ref 4.0–10.5)

## 2015-07-24 LAB — BASIC METABOLIC PANEL
Anion gap: 6 (ref 5–15)
BUN: 33 mg/dL — AB (ref 6–20)
CHLORIDE: 108 mmol/L (ref 101–111)
CO2: 28 mmol/L (ref 22–32)
CREATININE: 1.1 mg/dL — AB (ref 0.44–1.00)
Calcium: 10.1 mg/dL (ref 8.9–10.3)
GFR calc Af Amer: 58 mL/min — ABNORMAL LOW (ref >60)
GFR calc non Af Amer: 50 mL/min — ABNORMAL LOW (ref >60)
Glucose, Bld: 119 mg/dL — ABNORMAL HIGH (ref 65–99)
POTASSIUM: 5.1 mmol/L (ref 3.5–5.1)
Sodium: 142 mmol/L (ref 135–145)

## 2015-07-24 LAB — URINALYSIS, ROUTINE W REFLEX MICROSCOPIC
Bilirubin Urine: NEGATIVE
Glucose, UA: NEGATIVE mg/dL
Hgb urine dipstick: NEGATIVE
Ketones, ur: NEGATIVE mg/dL
LEUKOCYTES UA: NEGATIVE
NITRITE: NEGATIVE
PH: 5.5 (ref 5.0–8.0)
Protein, ur: NEGATIVE mg/dL
SPECIFIC GRAVITY, URINE: 1.02 (ref 1.005–1.030)

## 2015-07-24 LAB — LACTIC ACID, PLASMA
LACTIC ACID, VENOUS: 1 mmol/L (ref 0.5–2.0)
Lactic Acid, Venous: 3.1 mmol/L (ref 0.5–2.0)

## 2015-07-24 LAB — PROTIME-INR
INR: 1 (ref 0.00–1.49)
Prothrombin Time: 13.4 seconds (ref 11.6–15.2)

## 2015-07-24 LAB — D-DIMER, QUANTITATIVE: D-Dimer, Quant: 1.63 ug/mL-FEU — ABNORMAL HIGH (ref 0.00–0.50)

## 2015-07-24 LAB — APTT: APTT: 26 s (ref 24–37)

## 2015-07-24 LAB — TROPONIN I: TROPONIN I: 0.03 ng/mL (ref <0.031)

## 2015-07-24 MED ORDER — METOPROLOL TARTRATE 1 MG/ML IV SOLN: 2.5000 mg | INTRAVENOUS | Status: DC | PRN

## 2015-07-24 MED ORDER — GUAIFENESIN ER 600 MG PO TB12
1200.0000 mg | ORAL_TABLET | Freq: Two times a day (BID) | ORAL | Status: DC
  Administered 2015-07-24 – 2015-07-28 (×8): 1200 mg via ORAL
  Filled 2015-07-24 (×14): qty 2

## 2015-07-24 MED ORDER — DOXYCYCLINE HYCLATE 100 MG IV SOLR
100.0000 mg | Freq: Two times a day (BID) | INTRAVENOUS | Status: DC
  Administered 2015-07-25 – 2015-07-26 (×4): 100 mg via INTRAVENOUS
  Filled 2015-07-24 (×6): qty 100

## 2015-07-24 MED ORDER — SODIUM CHLORIDE 0.9 % IV BOLUS (SEPSIS)
1000.0000 mL | Freq: Once | INTRAVENOUS | Status: AC
  Administered 2015-07-24: 1000 mL via INTRAVENOUS

## 2015-07-24 MED ORDER — ASPIRIN 81 MG PO CHEW
81.0000 mg | CHEWABLE_TABLET | Freq: Every day | ORAL | Status: DC
  Administered 2015-07-25 – 2015-07-28 (×4): 81 mg via ORAL
  Filled 2015-07-24 (×4): qty 1

## 2015-07-24 MED ORDER — IPRATROPIUM-ALBUTEROL 0.5-2.5 (3) MG/3ML IN SOLN
3.0000 mL | Freq: Four times a day (QID) | RESPIRATORY_TRACT | Status: DC
  Administered 2015-07-24 – 2015-07-25 (×4): 3 mL via RESPIRATORY_TRACT
  Filled 2015-07-24 (×4): qty 3

## 2015-07-24 MED ORDER — ALBUTEROL SULFATE (2.5 MG/3ML) 0.083% IN NEBU
2.5000 mg | INHALATION_SOLUTION | Freq: Once | RESPIRATORY_TRACT | Status: AC
  Administered 2015-07-24: 2.5 mg via RESPIRATORY_TRACT
  Filled 2015-07-24: qty 3

## 2015-07-24 MED ORDER — IPRATROPIUM BROMIDE 0.02 % IN SOLN
1.0000 mg | Freq: Once | RESPIRATORY_TRACT | Status: AC
  Administered 2015-07-24: 1 mg via RESPIRATORY_TRACT
  Filled 2015-07-24: qty 5

## 2015-07-24 MED ORDER — ONDANSETRON HCL 4 MG/2ML IJ SOLN: 4.0000 mg | Freq: Four times a day (QID) | INTRAMUSCULAR | Status: DC | PRN

## 2015-07-24 MED ORDER — SODIUM CHLORIDE 0.9% FLUSH
3.0000 mL | Freq: Two times a day (BID) | INTRAVENOUS | Status: DC
  Administered 2015-07-25 – 2015-07-26 (×3): 3 mL via INTRAVENOUS

## 2015-07-24 MED ORDER — ALBUTEROL SULFATE (2.5 MG/3ML) 0.083% IN NEBU: 2.5000 mg | INHALATION_SOLUTION | RESPIRATORY_TRACT | Status: DC | PRN

## 2015-07-24 MED ORDER — IPRATROPIUM-ALBUTEROL 0.5-2.5 (3) MG/3ML IN SOLN
3.0000 mL | Freq: Once | RESPIRATORY_TRACT | Status: AC
  Administered 2015-07-24: 3 mL via RESPIRATORY_TRACT
  Filled 2015-07-24: qty 3

## 2015-07-24 MED ORDER — ONDANSETRON HCL 4 MG PO TABS: 4.0000 mg | ORAL_TABLET | Freq: Four times a day (QID) | ORAL | Status: DC | PRN

## 2015-07-24 MED ORDER — SODIUM CHLORIDE 0.9 % IV SOLN
INTRAVENOUS | Status: DC
  Administered 2015-07-24: 21:00:00 via INTRAVENOUS

## 2015-07-24 MED ORDER — SODIUM CHLORIDE 0.9 % IV SOLN: INTRAVENOUS | Status: AC

## 2015-07-24 MED ORDER — OXYCODONE-ACETAMINOPHEN 10-325 MG PO TABS: 1.0000 | ORAL_TABLET | Freq: Four times a day (QID) | ORAL | Status: DC | PRN

## 2015-07-24 MED ORDER — HEPARIN BOLUS VIA INFUSION
5000.0000 [IU] | Freq: Once | INTRAVENOUS | Status: AC
  Administered 2015-07-24: 5000 [IU] via INTRAVENOUS

## 2015-07-24 MED ORDER — ALBUTEROL (5 MG/ML) CONTINUOUS INHALATION SOLN
10.0000 mg/h | INHALATION_SOLUTION | Freq: Once | RESPIRATORY_TRACT | Status: AC
  Administered 2015-07-24: 10 mg/h via RESPIRATORY_TRACT
  Filled 2015-07-24: qty 20

## 2015-07-24 MED ORDER — OXYCODONE HCL 5 MG PO TABS
5.0000 mg | ORAL_TABLET | Freq: Four times a day (QID) | ORAL | Status: DC | PRN
  Administered 2015-07-25 – 2015-07-28 (×7): 5 mg via ORAL
  Filled 2015-07-24 (×7): qty 1

## 2015-07-24 MED ORDER — ALBUTEROL SULFATE (2.5 MG/3ML) 0.083% IN NEBU: 2.5000 mg | INHALATION_SOLUTION | RESPIRATORY_TRACT | Status: DC

## 2015-07-24 MED ORDER — DOXYCYCLINE HYCLATE 100 MG IV SOLR
INTRAVENOUS | Status: AC
  Filled 2015-07-24: qty 100

## 2015-07-24 MED ORDER — SODIUM CHLORIDE 0.45 % IV SOLN
INTRAVENOUS | Status: DC
  Administered 2015-07-24 – 2015-07-28 (×6): via INTRAVENOUS

## 2015-07-24 MED ORDER — OXYCODONE-ACETAMINOPHEN 5-325 MG PO TABS
1.0000 | ORAL_TABLET | Freq: Four times a day (QID) | ORAL | Status: DC | PRN
Start: 2015-07-24 — End: 2015-07-28
  Administered 2015-07-25 – 2015-07-28 (×6): 1 via ORAL
  Filled 2015-07-24 (×6): qty 1

## 2015-07-24 MED ORDER — INSULIN ASPART 100 UNIT/ML ~~LOC~~ SOLN: 0.0000 [IU] | Freq: Every day | SUBCUTANEOUS | Status: DC

## 2015-07-24 MED ORDER — HEPARIN (PORCINE) IN NACL 100-0.45 UNIT/ML-% IJ SOLN
1000.0000 [IU]/h | INTRAMUSCULAR | Status: DC
  Administered 2015-07-24 – 2015-07-25 (×2): 1000 [IU]/h via INTRAVENOUS
  Filled 2015-07-24 (×2): qty 250

## 2015-07-24 MED ORDER — INSULIN ASPART 100 UNIT/ML ~~LOC~~ SOLN
0.0000 [IU] | Freq: Three times a day (TID) | SUBCUTANEOUS | Status: DC
  Administered 2015-07-25 (×2): 2 [IU] via SUBCUTANEOUS
  Administered 2015-07-25: 1 [IU] via SUBCUTANEOUS

## 2015-07-24 MED ORDER — METHYLPREDNISOLONE SODIUM SUCC 125 MG IJ SOLR
125.0000 mg | Freq: Once | INTRAMUSCULAR | Status: AC
  Administered 2015-07-24: 125 mg via INTRAVENOUS
  Filled 2015-07-24: qty 2

## 2015-07-24 NOTE — ED Provider Notes (Signed)
CSN: [id number]     Arrival date & time 07/24/15  1535 History   First MD Initiated Contact with Patient 07/24/15 1545     Chief Complaint  Patient presents with  . decreased 02 sat      HPI Pt was seen at 1555. Per EMS and pt report, c/o gradual onset and persistence of constant "low O2 levels" since yesterday. Pt states her home PT and home health RN told her that her O2 Sats were "86%" while on her usual O2 2L N/C yesterday and today. Both increased pt's O2 to 4L with increasing O2 Sats to "93%." Pt states she was told by both that her equipment was "working ok." Pt was given albuterol neb with "some" improvement. Pt only c/o "cough" since being discharged from the hospital with dx pneumonia on 07/11/15. Denies CP/palpitations, no abd pain, no N/V/D, no back pain, no fevers.    Past Medical History  Diagnosis Date  . Sleep apnea     uses cpap  . Peripheral vascular disease ([place])   . GERD (gastroesophageal reflux disease)   . Neuromuscular disorder ([place])     HX of MD  . Diabetes ([place])   . Sinus node dysfunction ([facility])   . H/O cardiac pacemaker   . Tobacco abuse   . Hyperlipidemia   . SSS (sick sinus syndrome) ([place])   . DDD (degenerative disc disease), lumbar   . On home O2     2L N/C  . COPD (chronic obstructive pulmonary disease) ([name])    Past Surgical History  Procedure Laterality Date  . Insert / replace / remove pacemaker    . Cholecystectomy    . Abdominal hysterectomy    . Tonsillectomy    . Tubal ligation    . Femoral artery stent  02/18/2011  . Esophagogastroduodenoscopy  04/13/2002    A short segment of salmon-colored epithelium in the distal esophagus  consistent with Barrett's esophagus, biopsied/ The remainder of the esophageal mucosa, stomach, and duodenum through the second portion appeared normal  . Colonoscopy  04/13/2002    Diminutive polyps in the rectum cold biopsied/removed/ The remainder of the rectal mucosa and colon appeared normal.  . Lower extremity  angiogram N/A 02/18/2011    Procedure: LOWER EXTREMITY ANGIOGRAM;  Surgeon: Lorretta Harp, MD;  Location: [facility] CATH LAB;  Service: Cardiovascular;  Laterality: N/A;  . Lower extremity angiogram N/A 10/29/2012    Procedure: LOWER EXTREMITY ANGIOGRAM;  Surgeon: Lorretta Harp, MD;  Location: [facility] CATH LAB;  Service: Cardiovascular;  Laterality: N/A;  . Abdominal angiogram  10/29/2012    Procedure: ABDOMINAL ANGIOGRAM;  Surgeon: Lorretta Harp, MD;  Location: [facility] CATH LAB;  Service: Cardiovascular;;  . Implantable cardioverter defibrillator generator change Left 10/29/2013    Procedure: IMPLANTABLE CARDIOVERTER DEFIBRILLATOR GENERATOR CHANGE;  Surgeon: Sanda Klein, MD;  Location: [facility] CATH LAB;  Service: Cardiovascular;  Laterality: Left;  . Transthoracic echocardiogram  10/05/2011    EF 50-55% Pacemaker present,   . Transthoracic echocardiogram  05/20/2010    EF=>55%, pacer enduced LBBB, conduction abnormality  . Nm myocar perf wall motion  10/05/2011    Protocol:Lexiscan, mild perfusion defect due to pacing induced artifact  . Nm myocar perf wall motion  05/20/2010    Protocol:Persantine, EF78%, normal perfusion in all regions,   . Colonoscopy with propofol N/A 01/23/2015    Procedure: COLONOSCOPY WITH PROPOFOL;  Surgeon: Rogene Houston, MD;  Location: [facility] ORS;  Service: Endoscopy;  Laterality: N/A;  [place] time  in 0814  time out  0837  total time 23 minutes  . Esophagogastroduodenoscopy (egd) with propofol N/A 01/23/2015    Procedure: ESOPHAGOGASTRODUODENOSCOPY (EGD) WITH PROPOFOL;  Surgeon: Rogene Houston, MD;  Location: [facility] ORS;  Service: Endoscopy;  Laterality: N/A;  procedure 1  . Esophageal dilation N/A 01/23/2015    Procedure: ESOPHAGEAL DILATION;  Surgeon: Rogene Houston, MD;  Location: [facility] ORS;  Service: Endoscopy;  Laterality: N/A;  [place] 68  . Biopsy N/A 01/23/2015    Procedure: BIOPSY;  Surgeon: Rogene Houston, MD;  Location: [facility] ORS;  Service: Endoscopy;  Laterality: N/A;   duodenal and esophageal  . Polypectomy N/A 01/23/2015    Procedure: POLYPECTOMY;  Surgeon: Rogene Houston, MD;  Location: [facility] ORS;  Service: Endoscopy;  Laterality: N/A;  cecal, hepatic flexure, distal transverse colon   Family History  Problem Relation Age of Onset  . Cancer Mother   . Cancer Father    Social History  Substance Use Topics  . Smoking status: Current Every Day Smoker -- 1.00 packs/day for 50 years    Types: Cigarettes    Last Attempt to Quit: 02/02/2013  . Smokeless tobacco: Never Used     Comment: "LIGHT THEM AND LET THEM BURN UP"  . Alcohol Use: No    Review of Systems ROS: Statement: All systems negative except as marked or noted in the HPI; Constitutional: Negative for fever and chills. ; ; Eyes: Negative for eye pain, redness and discharge. ; ; ENMT: Negative for ear pain, hoarseness, nasal congestion, sinus pressure and sore throat. ; ; Cardiovascular: Negative for chest pain, palpitations, diaphoresis, and peripheral edema. ; ; Respiratory: +cough, "low O2 Sats." Negative for wheezing and stridor. ; ; Gastrointestinal: Negative for nausea, vomiting, diarrhea, abdominal pain, blood in stool, hematemesis, jaundice and rectal bleeding. . ; ; Genitourinary: Negative for dysuria, flank pain and hematuria. ; ; Musculoskeletal: Negative for back pain and neck pain. Negative for swelling and trauma.; ; Skin: Negative for pruritus, rash, abrasions, blisters, bruising and skin lesion.; ; Neuro: Negative for headache, lightheadedness and neck stiffness. Negative for weakness, altered level of consciousness , altered mental status, extremity weakness, paresthesias, involuntary movement, seizure and syncope.      Allergies  Prednisone  Home Medications   Prior to Admission medications   Medication Sig Start Date End Date Taking? Authorizing Provider  albuterol (PROVENTIL) (2.5 MG/3ML) 0.083% nebulizer solution Take 2.5 mg by nebulization every 6 (six) hours as needed for  wheezing or shortness of breath.     Historical Provider, MD  aspirin 81 MG tablet Take 1 tablet (81 mg total) by mouth daily. 03/03/15   Mihai Croitoru, MD  guaiFENesin (MUCINEX) 600 MG 12 hr tablet Take 1 tablet (600 mg total) by mouth 2 (two) times daily. 04/15/13   Nimish Luther Parody, MD  levofloxacin (LEVAQUIN) 750 MG tablet Take 1 tablet (750 mg total) by mouth daily. 07/11/15   Erline Hau, MD  Multiple Vitamins-Minerals (CENTRUM SILVER ULTRA WOMENS) TABS Take 1 tablet by mouth daily.     Historical Provider, MD  oxyCODONE-acetaminophen (PERCOCET) 10-325 MG per tablet Take 1 tablet by mouth 4 (four) times daily as needed for pain.     Historical Provider, MD  predniSONE (DELTASONE) 10 MG tablet Take 1 tablet (10 mg total) by mouth daily with breakfast. Take 6 tablets today and then decrease by 1 tablet daily until none are left. 07/11/15   Erline Hau, MD  senna-docusate (SENOKOT-S)  8.6-50 MG tablet Take 2 tablets by mouth daily as needed for mild constipation.     Historical Provider, MD  solifenacin (VESICARE) 5 MG tablet Take 5 mg by mouth daily.    Historical Provider, MD   BP 109/67 mmHg  Pulse 93  Temp(Src) 98.2 F (36.8 C) (Oral)  Resp 20  Ht 5\' 1"  (1.549 m)  Wt 147 lb (66.679 kg)  BMI 27.79 kg/m2  SpO2 93%   Patient Vitals for the past 24 hrs:  BP Temp Temp src Pulse Resp SpO2 Height Weight  07/24/15 2100 116/66 mmHg - - 70 13 97 % - -  07/24/15 2045 - - - - - 94 % - -  07/24/15 1930 (!) 93/44 mmHg - - 70 12 (!) 88 % - -  07/24/15 1920 - - - - - 90 % - -  07/24/15 1848 - - - - - 94 % - -  07/24/15 1830 (!) 102/53 mmHg - - 70 14 93 % - -  07/24/15 1800 (!) 120/104 mmHg - - 70 25 92 % - -  07/24/15 1730 108/60 mmHg - - 69 12 100 % - -  07/24/15 1700 107/63 mmHg - - 72 14 97 % - -  07/24/15 1630 95/60 mmHg - - 70 - 97 % - -  07/24/15 1625 - - - - - 95 % - -  07/24/15 1615 105/69 mmHg - - 70 - 94 % - -  07/24/15 1600 102/62 mmHg - - 70 - 91 % - -   07/24/15 1545 100/59 mmHg - - 70 - 94 % - -  07/24/15 1540 109/67 mmHg 98.2 F (36.8 C) Oral 93 20 93 % 5\' 1"  (1.549 m) 147 lb (66.679 kg)     Physical Exam  1600: Physical examination:  Nursing notes reviewed; Vital signs and O2 SAT reviewed;  Constitutional: Well developed, Well nourished, Well hydrated, In no acute distress; Head:  Normocephalic, atraumatic; Eyes: EOMI, PERRL, No scleral icterus; ENMT: Mouth and pharynx normal, Mucous membranes moist; Neck: Supple, Full range of motion, No lymphadenopathy; Cardiovascular: Regular rate and rhythm, No gallop; Respiratory: Breath sounds diminished & equal bilaterally, No wheezes.  Speaking full sentences with ease, Normal respiratory effort/excursion; Chest: Nontender, Movement normal; Abdomen: Soft, Nontender, Nondistended, Normal bowel sounds; Genitourinary: No CVA tenderness; Extremities: Pulses normal, No tenderness, No edema, No calf edema or asymmetry.; Neuro: AA&Ox3, Major CN grossly intact.  Speech clear. No gross focal motor or sensory deficits in extremities.; Skin: Color normal, Warm, Dry.   ED Course  Procedures (including critical care time) Labs Review  Imaging Review  I have personally reviewed and evaluated these images and lab results as part of my medical decision-making.   EKG Interpretation   Date/Time:  Friday July 24 2015 16:37:06 EDT Ventricular Rate:  70 PR Interval:  207 QRS Duration: 162 QT Interval:  440 QTC Calculation: 475 R Axis:   -86 Text Interpretation:  Atrial-ventricular dual-paced rhythm No further  analysis attempted due to paced rhythm Confirmed by [facility]  MD, Nunzio Cory  [phone]) on 07/24/2015 4:44:47 PM      MDM  MDM Reviewed: previous chart, nursing note and vitals Reviewed previous: labs and ECG Interpretation: labs, ECG and x-ray Total time providing critical care: 30-74 minutes. This excludes time spent performing separately reportable procedures and services. Consults: admitting  MD     CRITICAL CARE Performed by: Alfonzo Feller Total critical care time: 35 minutes Critical care time was exclusive of separately billable  procedures and treating other patients. Critical care was necessary to treat or prevent imminent or life-threatening deterioration. Critical care was time spent personally by me on the following activities: development of treatment plan with patient and/or surrogate as well as nursing, discussions with consultants, evaluation of patient's response to treatment, examination of patient, obtaining history from patient or surrogate, ordering and performing treatments and interventions, ordering and review of laboratory studies, ordering and review of radiographic studies, pulse oximetry and re-evaluation of patient's condition.   Results for orders placed or performed during the hospital encounter of [id number]  Basic metabolic panel  Result Value Ref Range   Sodium 142 135 - 145 mmol/L   Potassium 5.1 3.5 - 5.1 mmol/L   Chloride 108 101 - 111 mmol/L   CO2 28 22 - 32 mmol/L   Glucose, Bld 119 (H) 65 - 99 mg/dL   BUN 33 (H) 6 - 20 mg/dL   Creatinine, Ser 1.10 (H) 0.44 - 1.00 mg/dL   Calcium 10.1 8.9 - 10.3 mg/dL   GFR calc non Af Amer 50 (L) >60 mL/min   GFR calc Af Amer 58 (L) >60 mL/min   Anion gap 6 5 - 15  Troponin I  Result Value Ref Range   Troponin I 0.03 <0.031 ng/mL  Lactic acid, plasma  Result Value Ref Range   Lactic Acid, Venous 1.0 0.5 - 2.0 mmol/L  Lactic acid, plasma  Result Value Ref Range   Lactic Acid, Venous 3.1 (HH) 0.5 - 2.0 mmol/L  CBC with Differential  Result Value Ref Range   WBC 9.0 4.0 - 10.5 K/uL   RBC 5.44 (H) 3.87 - 5.11 MIL/uL   Hemoglobin 13.8 12.0 - 15.0 g/dL   HCT 46.6 (H) 36.0 - 46.0 %   MCV 85.7 78.0 - 100.0 fL   MCH 25.4 (L) 26.0 - 34.0 pg   MCHC 29.6 (L) 30.0 - 36.0 g/dL   RDW 20.8 (H) 11.5 - 15.5 %   Platelets 94 (L) 150 - 400 K/uL   Neutrophils Relative % 55 %   Neutro Abs 4.9 1.7 - 7.7 K/uL    Lymphocytes Relative 28 %   Lymphs Abs 2.5 0.7 - 4.0 K/uL   Monocytes Relative 7 %   Monocytes Absolute 0.6 0.1 - 1.0 K/uL   Eosinophils Relative 10 %   Eosinophils Absolute 0.9 (H) 0.0 - 0.7 K/uL   Basophils Relative 0 %   Basophils Absolute 0.0 0.0 - 0.1 K/uL  Urinalysis, Routine w reflex microscopic  Result Value Ref Range   Color, Urine YELLOW YELLOW   APPearance CLEAR CLEAR   Specific Gravity, Urine 1.020 1.005 - 1.030   pH 5.5 5.0 - 8.0   Glucose, UA NEGATIVE NEGATIVE mg/dL   Hgb urine dipstick NEGATIVE NEGATIVE   Bilirubin Urine NEGATIVE NEGATIVE   Ketones, ur NEGATIVE NEGATIVE mg/dL   Protein, ur NEGATIVE NEGATIVE mg/dL   Nitrite NEGATIVE NEGATIVE   Leukocytes, UA NEGATIVE NEGATIVE   Nm Pulmonary Perf And Vent 07/09/2015  CLINICAL DATA:  Shortness of breath increased over past few weeks, chronic pneumonia, elevated D-dimer, history diabetes mellitus, smoking, paroxysmal atrial fibrillation, COPD EXAM: NUCLEAR MEDICINE VENTILATION - PERFUSION LUNG SCAN TECHNIQUE: Ventilation images were obtained in multiple projections using inhaled aerosol Tc-51m DTPA. Perfusion images were obtained in multiple projections after intravenous injection of Tc-72m MAA. RADIOPHARMACEUTICALS:  30 mCi Technetium-44m DTPA aerosol inhalation and 3.1 mCi Technetium-55m MAA IV COMPARISON:  None Correlation:  Chest radiograph 07/09/2015 FINDINGS: Ventilation: Central airway and  oropharyngeal deposition of tracer. Swallowed aerosol within stomach. Enlargement of cardiac silhouette. No other ventilatory abnormalities. Perfusion: Photopenic defect LEFT apex due to pacemaker generator. Moderate perfusion defect medial basal segment RIGHT lower lobe best appreciated on RPO view, with normal ventilation at this site. No other focal perfusion defects. Chest radiograph demonstrates LEFT lower lobe atelectasis. Moderate-sized perfusion defect medial basal segment RIGHT lower lobe with normal ventilation representing an  intermediate probability for pulmonary embolism. Electronically Signed   By: Lavonia Dana M.D.   On: 07/09/2015 12:40   US Venous Img Lower Bilateral 07/09/2015  CLINICAL DATA:  PE.  Short of breath.  Elevated D-dimer. EXAM: BILATERAL LOWER EXTREMITY VENOUS DUPLEX ULTRASOUND TECHNIQUE: Doppler venous assessment of the bilateral lower extremity deep venous system was performed, including characterization of spectral flow, compressibility, and phasicity. COMPARISON:  None. FINDINGS: There is complete compressibility of the bilateral common femoral, femoral, and popliteal vein. Doppler analysis demonstrates respiratory phasicity and augmentation of flow. IMPRESSION: No evidence of lower extremity DVT. Electronically Signed   By: Marybelle Killings M.D.   On: 07/09/2015 16:30   Dg Chest Port 1 View 07/24/2015  CLINICAL DATA:  70 year old female with a history of shortness of breath EXAM: PORTABLE CHEST 1 VIEW COMPARISON:  07/11/2015, 07/09/2015 FINDINGS: Cardiomediastinal silhouette unchanged, with cardiomegaly. Unchanged position of left cardiac pacing device with 2 leads in place. Left basilar opacity obscuring the retrocardiac region an the left hemidiaphragm. No pneumothorax. Nonspecific opacity at the medial right base. Low lung volumes. IMPRESSION: Low lung volumes with ill-defined opacity in the retrocardiac region and at the right base. This may reflect atelectasis, although if there is concern for developing infection, a formal PA and lateral chest x-ray may be useful. Unchanged left chest wall cardiac pacing device. Signed, Dulcy Fanny. Earleen Newport, DO [facility] Electronically Signed   By: Corrie Mckusick D.O.   On: 07/24/2015 16:31    2135:  On arrival: pt hypoxic, lungs diminished, Sats 88 % O2 2L N/C. IV solumedrol and hour long neb started. After neb: pt appears more comfortable at rest, Sats increased to 97 % on O2 2L N/C, though lungs continue diminished,  +moist cough. Pt unable to move off stretcher to ambulate without O2 Sats dropping into 80's despite wearing O2 2L N/C. Short neb given with O2 Sats increasing to 97% on O2 2L N/C.  IVF bolus given for transient hypotension, elevated Cr from baseline, and mildly elevated lactic acid with improvement. Doubt sepsis at this time. Pt will need CT-A chest to r/o PE during admission (to f/u V/Q scan above).   T/C to [facility] Dr. Marily Memos, case discussed, including:  HPI, pertinent PM/SHx, VS/PE, dx testing, ED course and treatment:  Agreeable to admit, requests to write temporary orders, obtain tele bed to team [facility].     Francine Graven, DO 07/27/15 [phone]

## 2015-07-24 NOTE — H&P (Signed)
History and Physical    KHASSIDY TAFFE [id number] DOB: Dec 01, 1945 DOA: 07/24/2015  Referring MD/NP/PA: Dr. Thurnell Garbe - [facility] PCP: Robert Bellow, MD  Outpatient Specialists: Vascular, cardiology Patient coming from: Hypoxemia  Chief Complaint: Hypoxemia  HPI: [name] is a 70 y.o. female with medical history significant of OSA, PAF. PVD s/p bilat LE stent placement, GERD, DM, SSS, HLD, Tobacco use, pacemaker, COPD O2 Dependency. NEw Hypoxemia. Patient on O2 at home. Despite baseline 2 L nasal cannula patient noted to be hypoxic in the 80s. This was relieved by increasing O2 2-3 L. This is concerning the home health care staff who referred patient here to the emergency room. Patient states that ever since being discharged from the hospital for treatment for CAP and COPD exacerbation. She is progressively improved from a respiratory standpoint. Of note she is admitted for left lower lobe 3 acquired pneumonia. Patient completed her steroid taper and antibiotics a few days ago. Patient denies any fevers, productive cough, largely swelling, chest pain, sensation of shortness of breath, abdominal pain, dysuria, frequency, back pain, rash, diarrhea, palpitations, neck stiffness, headache  ED Course: Labs and workup mentioned below. Given the treatments which patient states subjectively makes her feel better there is been no change in O2 saturation level.  Review of Systems: As per HPI otherwise 10 point review of systems negative.     Past Medical History  Diagnosis Date  . Sleep apnea     uses cpap  . Peripheral vascular disease ([place])   . GERD (gastroesophageal reflux disease)   . Neuromuscular disorder ([place])     HX of MD  . Diabetes ([place])   . Sinus node dysfunction (HCC)   . H/O cardiac pacemaker   . Tobacco abuse   . Hyperlipidemia   . SSS (sick sinus syndrome) ([place])   . DDD (degenerative disc disease), lumbar   . On home O2     2L N/C  . COPD (chronic obstructive pulmonary  disease) ([place])     Past Surgical History  Procedure Laterality Date  . Insert / replace / remove pacemaker    . Cholecystectomy    . Abdominal hysterectomy    . Tonsillectomy    . Tubal ligation    . Femoral artery stent  02/18/2011  . Esophagogastroduodenoscopy  04/13/2002    A short segment of salmon-colored epithelium in the distal esophagus  consistent with Barrett's esophagus, biopsied/ The remainder of the esophageal mucosa, stomach, and duodenum through the second portion appeared normal  . Colonoscopy  04/13/2002    Diminutive polyps in the rectum cold biopsied/removed/ The remainder of the rectal mucosa and colon appeared normal.  . Lower extremity angiogram N/A 02/18/2011    Procedure: LOWER EXTREMITY ANGIOGRAM;  Surgeon: Lorretta Harp, MD;  Location: [facility] CATH LAB;  Service: Cardiovascular;  Laterality: N/A;  . Lower extremity angiogram N/A 10/29/2012    Procedure: LOWER EXTREMITY ANGIOGRAM;  Surgeon: Lorretta Harp, MD;  Location: [facility] CATH LAB;  Service: Cardiovascular;  Laterality: N/A;  . Abdominal angiogram  10/29/2012    Procedure: ABDOMINAL ANGIOGRAM;  Surgeon: Lorretta Harp, MD;  Location: [facility] CATH LAB;  Service: Cardiovascular;;  . Implantable cardioverter defibrillator generator change Left 10/29/2013    Procedure: IMPLANTABLE CARDIOVERTER DEFIBRILLATOR GENERATOR CHANGE;  Surgeon: Sanda Klein, MD;  Location: [facility] CATH LAB;  Service: Cardiovascular;  Laterality: Left;  . Transthoracic echocardiogram  10/05/2011    EF 50-55% Pacemaker present,   . Transthoracic echocardiogram  05/20/2010  EF=>55%, pacer enduced LBBB, conduction abnormality  . Nm myocar perf wall motion  10/05/2011    Protocol:Lexiscan, mild perfusion defect due to pacing induced artifact  . Nm myocar perf wall motion  05/20/2010    Protocol:Persantine, EF78%, normal perfusion in all regions,   . Colonoscopy with propofol N/A 01/23/2015    Procedure: COLONOSCOPY WITH PROPOFOL;  Surgeon: Rogene Houston, MD;  Location: [facility] ORS;  Service: Endoscopy;  Laterality: N/A;  [place] time in 0814  time out  0837  total time 23 minutes  . Esophagogastroduodenoscopy (egd) with propofol N/A 01/23/2015    Procedure: ESOPHAGOGASTRODUODENOSCOPY (EGD) WITH PROPOFOL;  Surgeon: Rogene Houston, MD;  Location: [facility] ORS;  Service: Endoscopy;  Laterality: N/A;  procedure 1  . Esophageal dilation N/A 01/23/2015    Procedure: ESOPHAGEAL DILATION;  Surgeon: Rogene Houston, MD;  Location: [facility]S;  Service: Endoscopy;  Laterality: N/A;  [place] 75  . Biopsy N/A 01/23/2015    Procedure: BIOPSY;  Surgeon: Rogene Houston, MD;  Location: [facility]S;  Service: Endoscopy;  Laterality: N/A;  duodenal and esophageal  . Polypectomy N/A 01/23/2015    Procedure: POLYPECTOMY;  Surgeon: Rogene Houston, MD;  Location: [facility]S;  Service: Endoscopy;  Laterality: N/A;  cecal, hepatic flexure, distal transverse colon     reports that she has been smoking Cigarettes.  She has a 50 pack-year smoking history. She has never used smokeless tobacco. She reports that she does not drink alcohol or use illicit drugs.  Allergies  Allergen Reactions  . Prednisone Other (See Comments)    Chest pain    Family History  Problem Relation Age of Onset  . Cancer Mother   . Cancer Father      Prior to Admission medications   Medication Sig Start Date End Date Taking? Authorizing Provider  albuterol (PROVENTIL) (2.5 MG/3ML) 0.083% nebulizer solution Take 2.5 mg by nebulization every 6 (six) hours as needed for wheezing or shortness of breath.    Yes Historical Provider, MD  aspirin 81 MG tablet Take 1 tablet (81 mg total) by mouth daily. 03/03/15  Yes Mihai Croitoru, MD  guaiFENesin (MUCINEX) 600 MG 12 hr tablet Take 1 tablet (600 mg total) by mouth 2 (two) times daily. 04/15/13  Yes Nimish Luther Parody, MD  oxyCODONE-acetaminophen (PERCOCET) 10-325 MG per tablet Take 1 tablet by mouth 4 (four) times daily as needed for pain.    Yes Historical  Provider, MD  OXYGEN Inhale 2 L into the lungs continuous.   Yes Historical Provider, MD  senna-docusate (SENOKOT-S) 8.6-50 MG tablet Take 2 tablets by mouth daily as needed for mild constipation.    Yes Historical Provider, MD  solifenacin (VESICARE) 5 MG tablet Take 5 mg by mouth daily.   Yes Historical Provider, MD  levofloxacin (LEVAQUIN) 750 MG tablet Take 1 tablet (750 mg total) by mouth daily. Patient not taking: Reported on 07/24/2015 07/11/15   Erline Hau, MD  Multiple Vitamins-Minerals (CENTRUM SILVER ULTRA WOMENS) TABS Take 1 tablet by mouth daily.     Historical Provider, MD  predniSONE (DELTASONE) 10 MG tablet Take 1 tablet (10 mg total) by mouth daily with breakfast. Take 6 tablets today and then decrease by 1 tablet daily until none are left. Patient not taking: Reported on 07/24/2015 07/11/15   Erline Hau, MD    Physical Exam: Filed Vitals:   07/24/15 1920 07/24/15 1930 07/24/15 2045 07/24/15 2100  BP:  93/44  116/66  Pulse:  70  70  Temp:      TempSrc:      Resp:  12  13  Height:      Weight:      SpO2: 90% 88% 94% 97%      Constitutional: NAD, calm, comfortable Filed Vitals:   07/24/15 1920 07/24/15 1930 07/24/15 2045 07/24/15 2100  BP:  93/44  116/66  Pulse:  70  70  Temp:      TempSrc:      Resp:  12  13  Height:      Weight:      SpO2: 90% 88% 94% 97%   Constitutional: Frail and cachectic appearing. No acute distress Eyes: PERRL, lids and conjunctivae normal ENMT: Dry MM. Poor dentition.  Neck: normal, supple, no masses, no thyromegaly Respiratory: Normal effort. On 3.5 L nasal cannula, right upper, middle, and lower lobe bronchi. Left upper and lower lobes normal. Cardiovascular: Regular rate and rhythm, 46 systolic murmur. No extremity edema. 2+ pedal pulses. No carotid bruits.  Abdomen: no tenderness, no masses palpated. No hepatosplenomegaly. Bowel sounds positive.  Musculoskeletal: no clubbing / cyanosis. No joint  deformity upper and lower extremities. Good ROM, no contractures. .  Skin: no rashes, lesions, ulcers. No induration Neurologic: CN 2-12 grossly intact. Sensation intact, DTR normal. Strength 5/5 in all 4.  Psychiatric: Normal judgment and insight. Alert and oriented x 3. Normal mood  Labs on Admission: I have personally reviewed following labs and imaging studies  CBC:  Recent Labs Lab 07/24/15 1611  WBC 9.0  NEUTROABS 4.9  HGB 13.8  HCT 46.6*  MCV 85.7  PLT 94*   Basic Metabolic Panel:  Recent Labs Lab 07/24/15 1611  NA 142  K 5.1  CL 108  CO2 28  GLUCOSE 119*  BUN 33*  CREATININE 1.10*  CALCIUM 10.1   GFR: Estimated Creatinine Clearance: 41.6 mL/min (by C-G formula based on Cr of 1.1). Liver Function Tests: No results for input(s): AST, ALT, ALKPHOS, BILITOT, PROT, ALBUMIN in the last 168 hours. No results for input(s): LIPASE, AMYLASE in the last 168 hours. No results for input(s): AMMONIA in the last 168 hours. Coagulation Profile: No results for input(s): INR, PROTIME in the last 168 hours. Cardiac Enzymes:  Recent Labs Lab 07/24/15 1611  TROPONINI 0.03   BNP (last 3 results) No results for input(s): PROBNP in the last 8760 hours. HbA1C: No results for input(s): HGBA1C in the last 72 hours. CBG: No results for input(s): GLUCAP in the last 168 hours. Lipid Profile: No results for input(s): CHOL, HDL, LDLCALC, TRIG, CHOLHDL, LDLDIRECT in the last 72 hours. Thyroid Function Tests: No results for input(s): TSH, T4TOTAL, FREET4, T3FREE, THYROIDAB in the last 72 hours. Anemia Panel: No results for input(s): VITAMINB12, FOLATE, FERRITIN, TIBC, IRON, RETICCTPCT in the last 72 hours. Urine analysis:    Component Value Date/Time   COLORURINE YELLOW 07/24/2015 2020   APPEARANCEUR CLEAR 07/24/2015 2020   LABSPEC 1.020 07/24/2015 2020   PHURINE 5.5 07/24/2015 2020   GLUCOSEU NEGATIVE 07/24/2015 2020   HGBUR NEGATIVE 07/24/2015 2020   BILIRUBINUR NEGATIVE  07/24/2015 2020   KETONESUR NEGATIVE 07/24/2015 2020   PROTEINUR NEGATIVE 07/24/2015 2020   UROBILINOGEN 0.2 08/25/2014 1919   NITRITE NEGATIVE 07/24/2015 2020   LEUKOCYTESUR NEGATIVE 07/24/2015 2020   Sepsis Labs: @LABRCNTIP (procalcitonin:4,lacticidven:4) )No results found for this or any previous visit (from the past 240 hour(s)).   Radiological Exams on Admission: Dg Chest Port 1 View  07/24/2015  CLINICAL DATA:  70 year old female with a history of shortness of breath EXAM: PORTABLE CHEST 1 VIEW COMPARISON:  07/11/2015, 07/09/2015 FINDINGS: Cardiomediastinal silhouette unchanged, with cardiomegaly. Unchanged position of left cardiac pacing device with 2 leads in place. Left basilar opacity obscuring the retrocardiac region an the left hemidiaphragm. No pneumothorax. Nonspecific opacity at the medial right base. Low lung volumes. IMPRESSION: Low lung volumes with ill-defined opacity in the retrocardiac region and at the right base. This may reflect atelectasis, although if there is concern for developing infection, a formal PA and lateral chest x-ray may be useful. Unchanged left chest wall cardiac pacing device. Signed, Dulcy Fanny. Earleen Newport, DO [facility] Electronically Signed   By: Corrie Mckusick D.O.   On: 07/24/2015 16:31     Assessment/Plan Active Problems:   Acute respiratory failure with hypoxia (HCC)   Pacemaker   SSS (sick sinus syndrome) (HCC)   COPD with acute exacerbation (HCC)   Thrombocytopenia (HCC)   Diabetes mellitus with complication (HCC)   PAF (paroxysmal atrial fibrillation) (HCC)  Acute on chronic respiratory failure: Patient with new O2 requirement. Patient requiring 3-4 L noted maintain oxygen saturations greater than 90%. Checks x-ray without direct evidence of recurrent pneumonia or acute infectious process though denoting possible area of infarction. Of note patient recently discharged after treatment for  COPD exacerbation and left lower lobe community acquired pneumonia. Finished Levaquin and steroid Dosepak couple of days ago. Patient without any respiratory complaints at time of admission. Concern for pulmonary embolus given hypoxemia, CXR findings, rising lactic acid, and absence of elevated WBC. Symptoms may also be simply due to recurrent COPD exacerbation. Patient expressing subjective improvement with with nebulizer treatments - Telemetry - Stat lower extremity Dopplers (consider CTA versus VQ scan positive as there is suspected pulmonary embolus.) - Heparin drip, troponins - ABG - Trend lactic acid - O2 - Duo nebs every 4 hours - Doxycycline (DC antibiotics Dopplers positive for DVT) - Mucinex  - Consider prednisone if not improving  Thrombocytopenia:  94. Unclear etiology - CBC in am  DM: diet controlled - A1c - SSI  PAF/SSS/Pacemaker: h/o GI bleed so no chronic anticoagulation. Rate controlled. CHA2DS2-VASc = ~6 - Lopressor IV PRN - Tele    DVT prophylaxis: Heparin drip Code Status: FULL Family Communication: son Disposition Plan: Obs Consults called: none Admission status: Obs - tele   Cami Delawder J MD [facility]   If 7PM-7AM, please contact night-coverage [email or web address] Password [facility]  07/24/2015, 10:16 PM

## 2015-07-24 NOTE — Progress Notes (Signed)
ANTICOAGULATION CONSULT NOTE - Initial Consult  Pharmacy Consult for HEPARIN Indication: pulmonary embolus  Allergies  Allergen Reactions  . Prednisone Other (See Comments)    Chest pain   Patient Measurements: Height: 5\' 1"  (154.9 cm) Weight: 147 lb (66.679 kg) IBW/kg (Calculated) : 47.8 HEPARIN DW (KG): 61.8  Vital Signs: Temp: 98.2 F (36.8 C) (04/21 1540) Temp Source: Oral (04/21 1540) BP: 133/47 mmHg (04/21 2130) Pulse Rate: 123 (04/21 2130)  Labs:  Recent Labs  07/24/15 1611  HGB 13.8  HCT 46.6*  PLT 94*  CREATININE 1.10*  TROPONINI 0.03   Estimated Creatinine Clearance: 41.6 mL/min (by C-G formula based on Cr of 1.1).  Medical History: Past Medical History  Diagnosis Date  . Sleep apnea     uses cpap  . Peripheral vascular disease ([place])   . GERD (gastroesophageal reflux disease)   . Neuromuscular disorder ([place])     HX of MD  . Diabetes ([place])   . Sinus node dysfunction (HCC)   . H/O cardiac pacemaker   . Tobacco abuse   . Hyperlipidemia   . SSS (sick sinus syndrome) ([place])   . DDD (degenerative disc disease), lumbar   . On home O2     2L N/C  . COPD (chronic obstructive pulmonary disease) (HCC)    Medications:   (Not in a hospital admission)  Assessment: 70yo female with hypoxemia.  Asked to initiate Heparin for possible PE.  Goal of Therapy:  Heparin level 0.3-0.7 units/ml Monitor platelets by anticoagulation protocol: Yes   Plan:  Heparin 5000 units bolus now x 1 Heparin infusion at 1000 units/hr Heparin level in 6-8 hrs Heparin level and CBC daily  [place] Crane, Amma Crear A 07/24/2015,10:26 PM

## 2015-07-24 NOTE — ED Notes (Signed)
CRITICAL VALUE ALERT  Critical value received:  Lactic Acid 3.1  Date of notification:  07/24/15  Time of notification:  1936 hrs  Critical value read back:Yes.    Nurse who received alert:  Y. Raevyn Sokol, RN  Responding MD:  Dr. Elise Benne  Time MD responded:  1937 hrs

## 2015-07-24 NOTE — ED Notes (Signed)
Pt reports recently d/c'd from the hospital with pneumonia.  Reports today [place] RN said 02 sat on 2liters was 86%.  RN increased 02 to 4 liters and gave albuterol neb and called EMS>  EMS reports 02 sat on 4liters and 02 sat is 93%.  Pt denies pain, fever, or SOB.  Reports has had a cough since having pneumonia.

## 2015-07-25 ENCOUNTER — Observation Stay (HOSPITAL_COMMUNITY): Payer: Medicare Other

## 2015-07-25 ENCOUNTER — Other Ambulatory Visit (HOSPITAL_COMMUNITY): Payer: Medicare Other

## 2015-07-25 DIAGNOSIS — Z950 Presence of cardiac pacemaker: Secondary | ICD-10-CM | POA: Diagnosis not present

## 2015-07-25 DIAGNOSIS — G4733 Obstructive sleep apnea (adult) (pediatric): Secondary | ICD-10-CM | POA: Diagnosis present

## 2015-07-25 DIAGNOSIS — I82431 Acute embolism and thrombosis of right popliteal vein: Secondary | ICD-10-CM | POA: Diagnosis present

## 2015-07-25 DIAGNOSIS — E785 Hyperlipidemia, unspecified: Secondary | ICD-10-CM | POA: Diagnosis present

## 2015-07-25 DIAGNOSIS — E1151 Type 2 diabetes mellitus with diabetic peripheral angiopathy without gangrene: Secondary | ICD-10-CM | POA: Diagnosis present

## 2015-07-25 DIAGNOSIS — X58XXXA Exposure to other specified factors, initial encounter: Secondary | ICD-10-CM | POA: Diagnosis present

## 2015-07-25 DIAGNOSIS — R911 Solitary pulmonary nodule: Secondary | ICD-10-CM | POA: Diagnosis present

## 2015-07-25 DIAGNOSIS — J9601 Acute respiratory failure with hypoxia: Secondary | ICD-10-CM

## 2015-07-25 DIAGNOSIS — I82401 Acute embolism and thrombosis of unspecified deep veins of right lower extremity: Secondary | ICD-10-CM | POA: Diagnosis not present

## 2015-07-25 DIAGNOSIS — I82409 Acute embolism and thrombosis of unspecified deep veins of unspecified lower extremity: Secondary | ICD-10-CM

## 2015-07-25 DIAGNOSIS — I2699 Other pulmonary embolism without acute cor pulmonale: Secondary | ICD-10-CM | POA: Diagnosis not present

## 2015-07-25 DIAGNOSIS — D696 Thrombocytopenia, unspecified: Secondary | ICD-10-CM | POA: Diagnosis present

## 2015-07-25 DIAGNOSIS — Z9981 Dependence on supplemental oxygen: Secondary | ICD-10-CM | POA: Diagnosis not present

## 2015-07-25 DIAGNOSIS — K219 Gastro-esophageal reflux disease without esophagitis: Secondary | ICD-10-CM | POA: Diagnosis present

## 2015-07-25 DIAGNOSIS — T17990A Other foreign object in respiratory tract, part unspecified in causing asphyxiation, initial encounter: Secondary | ICD-10-CM | POA: Diagnosis present

## 2015-07-25 DIAGNOSIS — Z86711 Personal history of pulmonary embolism: Secondary | ICD-10-CM | POA: Diagnosis not present

## 2015-07-25 DIAGNOSIS — I2692 Saddle embolus of pulmonary artery without acute cor pulmonale: Secondary | ICD-10-CM | POA: Diagnosis not present

## 2015-07-25 DIAGNOSIS — M5136 Other intervertebral disc degeneration, lumbar region: Secondary | ICD-10-CM | POA: Diagnosis present

## 2015-07-25 DIAGNOSIS — J690 Pneumonitis due to inhalation of food and vomit: Secondary | ICD-10-CM | POA: Diagnosis present

## 2015-07-25 DIAGNOSIS — R0902 Hypoxemia: Secondary | ICD-10-CM | POA: Diagnosis present

## 2015-07-25 DIAGNOSIS — J441 Chronic obstructive pulmonary disease with (acute) exacerbation: Secondary | ICD-10-CM | POA: Diagnosis present

## 2015-07-25 DIAGNOSIS — Z809 Family history of malignant neoplasm, unspecified: Secondary | ICD-10-CM | POA: Diagnosis not present

## 2015-07-25 DIAGNOSIS — F17210 Nicotine dependence, cigarettes, uncomplicated: Secondary | ICD-10-CM | POA: Diagnosis present

## 2015-07-25 DIAGNOSIS — I480 Paroxysmal atrial fibrillation: Secondary | ICD-10-CM | POA: Diagnosis present

## 2015-07-25 DIAGNOSIS — J9621 Acute and chronic respiratory failure with hypoxia: Secondary | ICD-10-CM | POA: Diagnosis present

## 2015-07-25 LAB — CBC
HCT: 39.8 % (ref 36.0–46.0)
Hemoglobin: 12 g/dL (ref 12.0–15.0)
MCH: 25.6 pg — ABNORMAL LOW (ref 26.0–34.0)
MCHC: 30.2 g/dL (ref 30.0–36.0)
MCV: 84.9 fL (ref 78.0–100.0)
PLATELETS: 99 10*3/uL — AB (ref 150–400)
RBC: 4.69 MIL/uL (ref 3.87–5.11)
RDW: 20.6 % — AB (ref 11.5–15.5)
WBC: 7 10*3/uL (ref 4.0–10.5)

## 2015-07-25 LAB — BASIC METABOLIC PANEL
ANION GAP: 6 (ref 5–15)
BUN: 30 mg/dL — AB (ref 6–20)
CALCIUM: 9.4 mg/dL (ref 8.9–10.3)
CO2: 22 mmol/L (ref 22–32)
Chloride: 109 mmol/L (ref 101–111)
Creatinine, Ser: 1.04 mg/dL — ABNORMAL HIGH (ref 0.44–1.00)
GFR calc Af Amer: >60 mL/min (ref >60)
GFR, EST NON AFRICAN AMERICAN: 53 mL/min — AB (ref >60)
Glucose, Bld: 254 mg/dL — ABNORMAL HIGH (ref 65–99)
POTASSIUM: 5.2 mmol/L — AB (ref 3.5–5.1)
SODIUM: 137 mmol/L (ref 135–145)

## 2015-07-25 LAB — HEPARIN LEVEL (UNFRACTIONATED): Heparin Unfractionated: 0.56 IU/mL (ref 0.30–0.70)

## 2015-07-25 LAB — GLUCOSE, CAPILLARY
GLUCOSE-CAPILLARY: 198 mg/dL — AB (ref 65–99)
GLUCOSE-CAPILLARY: 183 mg/dL — AB (ref 65–99)
Glucose-Capillary: 147 mg/dL — ABNORMAL HIGH (ref 65–99)

## 2015-07-25 MED ORDER — SODIUM CHLORIDE 0.9 % IV SOLN
INTRAVENOUS | Status: AC
  Filled 2015-07-25: qty 3

## 2015-07-25 MED ORDER — SODIUM CHLORIDE 0.9 % IV SOLN
3.0000 g | Freq: Three times a day (TID) | INTRAVENOUS | Status: DC
  Administered 2015-07-25 – 2015-07-28 (×9): 3 g via INTRAVENOUS
  Filled 2015-07-25 (×12): qty 3

## 2015-07-25 MED ORDER — IPRATROPIUM-ALBUTEROL 0.5-2.5 (3) MG/3ML IN SOLN
3.0000 mL | Freq: Three times a day (TID) | RESPIRATORY_TRACT | Status: DC
  Administered 2015-07-26 – 2015-07-28 (×7): 3 mL via RESPIRATORY_TRACT
  Filled 2015-07-25 (×8): qty 3

## 2015-07-25 MED ORDER — IOHEXOL 350 MG/ML SOLN
100.0000 mL | Freq: Once | INTRAVENOUS | Status: AC | PRN
  Administered 2015-07-25: 100 mL via INTRAVENOUS

## 2015-07-25 NOTE — Progress Notes (Signed)
Pharmacy Antibiotic Note  [name] is a 70 y.o. female admitted on 07/24/2015 with pneumonia.  Pharmacy has been consulted for UNASYN dosing.  Plan:  Unasyn 3gm IVq8h  Continue Doxycycline per MD for now  Deescalate ABX when improved  Monitor labs, renal fxn, progress and c/s  Height: 5\' 1"  (154.9 cm) Weight: 147 lb (66.679 kg) IBW/kg (Calculated) : 47.8  Temp (24hrs), Avg:97.9 F (36.6 C), Min:97.8 F (36.6 C), Max:98 F (36.7 C)   Recent Labs Lab 07/24/15 1611 07/24/15 1858 07/25/15 0650  WBC 9.0  --  7.0  CREATININE 1.10*  --  1.04*  LATICACIDVEN 1.0 3.1*  --     Estimated Creatinine Clearance: 44 mL/min (by C-G formula based on Cr of 1.04).    Allergies  Allergen Reactions  . Prednisone Other (See Comments)    Chest pain   Anti-infectives    Start     Dose/Rate Route Frequency Ordered Stop   07/25/15 1700  Ampicillin-Sulbactam (UNASYN) 3 g in sodium chloride 0.9 % 100 mL IVPB     3 g 100 mL/hr over 60 Minutes Intravenous Every 8 hours 07/25/15 1617     07/24/15 2300  doxycycline (VIBRAMYCIN) 100 mg in dextrose 5 % 250 mL IVPB     100 mg 125 mL/hr over 120 Minutes Intravenous Every 12 hours 07/24/15 2215       Microbiology results: 4/21 UCx: pending   Thank you for allowing pharmacy to be a part of this patient's care.  Hart Robinsons A 07/25/2015 4:22 PM

## 2015-07-25 NOTE — Progress Notes (Signed)
[facility] PROGRESS NOTE  [name] [id number] DOB: 11-30-1945 DOA: 07/24/2015 PCP: Robert Bellow, MD  Assessment/Plan: Acute on chronic hypoxemic respiratory failure -CT scan shows evidence for acute pulmonary embolism, also atelectasis with possibly mucus plugging also possibly pneumonia.  -will request speech therapy evaluation as I suspect she may be aspirating. -We'll also request pulmonary evaluation to assist with mucus plugging. Will add flutter valve. -Continue oxygen supplementation as needed. -Start antibiotics for pneumonia  Acute pulmonary embolism and DVT -Continue heparin drip for now, we'll need to discuss with patient and family options for long-term anticoagulation. -Would recommend one of the newer oral anticoagulants for ease of administration and monitoring.  Probable aspiration pneumonia -Start Unasyn. -Speech therapy evaluation.  Solitary pulmonary nodule -We'll need repeat CT chest in 6 months.  COPD -Does not appear exacerbated at this point.  Code Status: Full code Family Communication: Patient only  Disposition Plan: To be determined   Consultants:  None   Antibiotics:  Unasyn   Subjective: Feels a little short of breath and weak  Objective: Filed Vitals:   07/25/15 0456 07/25/15 0800 07/25/15 1319 07/25/15 1323  BP: 117/48  111/40   Pulse: 70  70   Temp: 97.8 F (36.6 C)  98 F (36.7 C)   TempSrc: Oral  Oral   Resp: 16  16   Height:      Weight:      SpO2: 98% 95% 96% 97%    Intake/Output Summary (Last 24 hours) at 07/25/15 1522 Last data filed at 07/25/15 1313  Gross per 24 hour  Intake 1399.33 ml  Output      0 ml  Net 1399.33 ml   Filed Weights   07/24/15 1540  Weight: 66.679 kg (147 lb)    Exam:   General:  Alert, awake, oriented 3  Cardiovascular: Regular rate and rhythm  Respiratory: Fair air movement, decreased breath sounds, no wheezes or rhonchi  Abdomen: Soft, nontender,  nondistended, positive bowel sounds  Extremities: No clubbing, cyanosis or edema, positive pulses   Neurologic:  Grossly intact and nonfocal  Data Reviewed: Basic Metabolic Panel:  Recent Labs Lab 07/24/15 1611 07/25/15 0650  NA 142 137  K 5.1 5.2*  CL 108 109  CO2 28 22  GLUCOSE 119* 254*  BUN 33* 30*  CREATININE 1.10* 1.04*  CALCIUM 10.1 9.4   Liver Function Tests: No results for input(s): AST, ALT, ALKPHOS, BILITOT, PROT, ALBUMIN in the last 168 hours. No results for input(s): LIPASE, AMYLASE in the last 168 hours. No results for input(s): AMMONIA in the last 168 hours. CBC:  Recent Labs Lab 07/24/15 1611 07/25/15 0650  WBC 9.0 7.0  NEUTROABS 4.9  --   HGB 13.8 12.0  HCT 46.6* 39.8  MCV 85.7 84.9  PLT 94* 99*   Cardiac Enzymes:  Recent Labs Lab 07/24/15 1611  TROPONINI 0.03   BNP (last 3 results)  Recent Labs  08/25/14 1710 07/09/15 0040  BNP 886.0* 636.0*    ProBNP (last 3 results) No results for input(s): PROBNP in the last 8760 hours.  CBG:  Recent Labs Lab 07/25/15 0745 07/25/15 1134  GLUCAP 198* 183*    No results found for this or any previous visit (from the past 240 hour(s)).   Studies: Ct Angio Chest Pe W/cm &/or Wo Cm  07/25/2015  CLINICAL DATA:  Right lower extremity deep venous thrombosis diagnosed this morning. Clinical concern for pulmonary embolism. EXAM: CT ANGIOGRAPHY CHEST WITH CONTRAST TECHNIQUE: Multidetector  CT imaging of the chest was performed using the standard protocol during bolus administration of intravenous contrast. Multiplanar CT image reconstructions and MIPs were obtained to evaluate the vascular anatomy. CONTRAST:  155mL OMNIPAQUE IOHEXOL 350 MG/ML SOLN COMPARISON:  08/02/2013 unenhanced chest CT and Chest radiograph from one day prior. FINDINGS: Mediastinum/Nodes: The study is high quality for the evaluation of pulmonary embolism. There is a curvilinear and slightly eccentric thrombus within the lobar  pulmonary artery branch of the right lower lobe. No additional pulmonary emboli. Atherosclerotic nonaneurysmal thoracic aorta. Top-normal caliber main pulmonary artery (2.9 cm diameter). Mild cardiomegaly with dilated right ventricle and right atrium, not appreciably changed from prior chest CT. No pericardial fluid/thickening. Left anterior descending, left circumflex and right coronary atherosclerosis. Two lead left subclavian pacemaker with lead tips in the right atrium and right ventricular apex. Stable coarsely calcified 1.2 cm left thyroid lobe nodule. Fluid level in the mid thoracic esophagus. No pathologically enlarged axillary, mediastinal or hilar lymph nodes. Lungs/Pleura: No pneumothorax. No pleural effusion. Secretions nearly occlude the bilateral lower lobe bronchi. Complete right middle lobe and left lower lobe consolidation and near complete right lower lobe consolidation with significant volume loss in these lobes. Peripheral right upper lobe 10 x 8 mm sub solid pulmonary nodule (series 5/image 20), which may have increased from 6 x 5 mm on [id number] (uncertain if this represents the same nodule). Mosaic attenuation in the aerated portions of the lungs. Upper abdomen: Unremarkable. Musculoskeletal: No aggressive appearing focal osseous lesions. Mild degenerative changes in the thoracic spine. Review of the MIP images confirms the above findings. IMPRESSION: 1. Solitary lobar pulmonary embolus in the right lower lobe, which appears subacute. 2. Stable mild cardiomegaly with chronic dilatation of the right heart chambers. 3. Complete right middle lobe and left lower lobe consolidation and near complete right lower lobe consolidation with associated significant volume loss in these lobes, favor atelectasis, cannot exclude a component of aspiration or pneumonia given the near complete occlusion of the associated bronchi by secretions/mucous plugging. 4. Fluid in the thoracic esophagus, suggesting  esophageal dysmotility and/or gastroesophageal reflux. 5. Right upper lobe 10 mm subsolid pulmonary nodule. Initial follow-up with CT at 6-12 months is recommended to confirm persistence. If persistent, repeat CT is recommended every 2 years until 5 years of stability has been established. This recommendation follows the consensus statement: Guidelines for Management of Incidental Pulmonary Nodules Detected on CT Images:From the Fleischner Society 2017; published online before print (10.1148/radiol.SG:5268862). 6. Three-vessel coronary atherosclerosis. Critical Value/emergent results were called by telephone at the time of interpretation on 07/25/2015 at 3:05 pm to Dr. Lelon Frohlich , who verbally acknowledged these results. Electronically Signed   By: Ilona Sorrel M.D.   On: 07/25/2015 15:06   US Venous Img Lower Bilateral  07/25/2015  CLINICAL DATA:  Hypoxemia.  History of PE. EXAM: BILATERAL LOWER EXTREMITY VENOUS DOPPLER ULTRASOUND TECHNIQUE: Gray-scale sonography with graded compression, as well as color Doppler and duplex ultrasound were performed to evaluate the lower extremity deep venous systems from the level of the common femoral vein and including the common femoral, femoral, profunda femoral, popliteal and calf veins including the posterior tibial, peroneal and gastrocnemius veins when visible. The superficial great saphenous vein was also interrogated. Spectral Doppler was utilized to evaluate flow at rest and with distal augmentation maneuvers in the common femoral, femoral and popliteal veins. COMPARISON:  Bilateral venous ultrasound, 07/09/2015 FINDINGS: RIGHT LOWER EXTREMITY Common Femoral Vein: No evidence of thrombus. Normal compressibility, respiratory phasicity  and response to augmentation. Saphenofemoral Junction: No evidence of thrombus. Normal compressibility and flow on color Doppler imaging. Profunda Femoral Vein: No evidence of thrombus. Normal compressibility and flow on  color Doppler imaging. Femoral Vein: No evidence of thrombus. Normal compressibility, respiratory phasicity and response to augmentation. Popliteal Vein: There is noncompressible acute thrombus in the popliteal vein call partially occlusive. Calf Veins: Veins not visualized.  Additional thrombus suspected. Superficial Great Saphenous Vein: No evidence of thrombus. Normal compressibility and flow on color Doppler imaging. Venous Reflux:  None. Other Findings:  None. LEFT LOWER EXTREMITY Common Femoral Vein: No evidence of thrombus. Normal compressibility, respiratory phasicity and response to augmentation. Saphenofemoral Junction: No evidence of thrombus. Normal compressibility and flow on color Doppler imaging. Profunda Femoral Vein: No evidence of thrombus. Normal compressibility and flow on color Doppler imaging. Femoral Vein: No evidence of thrombus. Normal compressibility, respiratory phasicity and response to augmentation. Popliteal Vein: No evidence of thrombus. Normal compressibility, respiratory phasicity and response to augmentation. Calf Veins: Not visualized. Superficial Great Saphenous Vein: No evidence of thrombus. Normal compressibility and flow on color Doppler imaging. Venous Reflux:  None. Other Findings:  None. IMPRESSION: 1. Acute deep venous thrombosis in the right popliteal vein with possible additional thrombus into the calf veins, which were not visualized. This has developed since the prior lower extremity venous ultrasound. 2. No evidence of a left lower extremity deep venous thrombosis. Electronically Signed   By: Lajean Manes M.D.   On: 07/25/2015 10:06   Dg Chest Port 1 View  07/24/2015  CLINICAL DATA:  70 year old female with a history of shortness of breath EXAM: PORTABLE CHEST 1 VIEW COMPARISON:  07/11/2015, 07/09/2015 FINDINGS: Cardiomediastinal silhouette unchanged, with cardiomegaly. Unchanged position of left cardiac pacing device with 2 leads in place. Left basilar opacity  obscuring the retrocardiac region an the left hemidiaphragm. No pneumothorax. Nonspecific opacity at the medial right base. Low lung volumes. IMPRESSION: Low lung volumes with ill-defined opacity in the retrocardiac region and at the right base. This may reflect atelectasis, although if there is concern for developing infection, a formal PA and lateral chest x-ray may be useful. Unchanged left chest wall cardiac pacing device. Signed, Dulcy Fanny. Earleen Newport, DO [facility] Electronically Signed   By: Corrie Mckusick D.O.   On: 07/24/2015 16:31    Scheduled Meds: . sodium chloride   Intravenous STAT  . aspirin  81 mg Oral Daily  . doxycycline (VIBRAMYCIN) IV  100 mg Intravenous Q12H  . guaiFENesin  1,200 mg Oral BID  . insulin aspart  0-5 Units Subcutaneous QHS  . insulin aspart  0-9 Units Subcutaneous TID WC  . ipratropium-albuterol  3 mL Nebulization Q6H  . sodium chloride flush  3 mL Intravenous Q12H   Continuous Infusions: . sodium chloride 100 mL/hr at 07/25/15 1243  . sodium chloride 100 mL/hr at 07/24/15 2122  . heparin 1,000 Units/hr (07/24/15 2355)    Active Problems:   Acute respiratory failure with hypoxia (HCC)   Pacemaker   SSS (sick sinus syndrome) (HCC)   COPD with acute exacerbation (HCC)   Thrombocytopenia (HCC)   Diabetes mellitus with complication (HCC)   PAF (paroxysmal atrial fibrillation) ([place])   Acute pulmonary embolism ([place])   DVT (deep venous thrombosis) ([place])    Time spent: 35 minutes. Greater than 50% of this time was spent in direct contact with the patient coordinating care.    Lelon Frohlich  [facility] Pager [phone]  If 7PM-7AM,  please contact night-coverage at [email or web address], password [facility] 07/25/2015, 3:22 PM  LOS: 0 days

## 2015-07-25 NOTE — Progress Notes (Signed)
ANTICOAGULATION CONSULT NOTE - follow up  Pharmacy Consult for HEPARIN Indication: pulmonary embolus  Allergies  Allergen Reactions  . Prednisone Other (See Comments)    Chest pain   Patient Measurements: Height: 5\' 1"  (154.9 cm) Weight: 147 lb (66.679 kg) IBW/kg (Calculated) : 47.8 HEPARIN DW (KG): 61.8  Vital Signs: Temp: 97.8 F (36.6 C) (04/22 0456) Temp Source: Oral (04/22 0456) BP: 117/48 mmHg (04/22 0456) Pulse Rate: 70 (04/22 0456)  Labs:  Recent Labs  07/24/15 1610 07/24/15 1611 07/25/15 0650  HGB  --  13.8 12.0  HCT  --  46.6* 39.8  PLT  --  94* 99*  APTT 26  --   --   LABPROT 13.4  --   --   INR 1.00  --   --   HEPARINUNFRC  --   --  0.56  CREATININE  --  1.10* 1.04*  TROPONINI  --  0.03  --    Estimated Creatinine Clearance: 44 mL/min (by C-G formula based on Cr of 1.04).  Medical History: Past Medical History  Diagnosis Date  . Sleep apnea     uses cpap  . Peripheral vascular disease ([place])   . GERD (gastroesophageal reflux disease)   . Neuromuscular disorder ([place])     HX of MD  . Diabetes ([place])   . Sinus node dysfunction (HCC)   . H/O cardiac pacemaker   . Tobacco abuse   . Hyperlipidemia   . SSS (sick sinus syndrome) ([place])   . DDD (degenerative disc disease), lumbar   . On home O2     2L N/C  . COPD (chronic obstructive pulmonary disease) (HCC)    Medications:  Prescriptions prior to admission  Medication Sig Dispense Refill Last Dose  . albuterol (PROVENTIL) (2.5 MG/3ML) 0.083% nebulizer solution Take 2.5 mg by nebulization every 6 (six) hours as needed for wheezing or shortness of breath.    07/24/2015 at Unknown time  . aspirin 81 MG tablet Take 1 tablet (81 mg total) by mouth daily. 30 tablet  07/24/2015 at Unknown time  . guaiFENesin (MUCINEX) 600 MG 12 hr tablet Take 1 tablet (600 mg total) by mouth 2 (two) times daily. 30 tablet 0 07/24/2015 at Unknown time  . oxyCODONE-acetaminophen (PERCOCET) 10-325 MG per tablet Take 1 tablet by  mouth 4 (four) times daily as needed for pain.    07/24/2015 at Unknown time  . OXYGEN Inhale 2 L into the lungs continuous.   07/24/2015 at Unknown time  . senna-docusate (SENOKOT-S) 8.6-50 MG tablet Take 2 tablets by mouth daily as needed for mild constipation.    Past Week at Unknown time  . solifenacin (VESICARE) 5 MG tablet Take 5 mg by mouth daily.   Past Month at Unknown time  . levofloxacin (LEVAQUIN) 750 MG tablet Take 1 tablet (750 mg total) by mouth daily. (Patient not taking: Reported on 07/24/2015) 5 tablet 0   . Multiple Vitamins-Minerals (CENTRUM SILVER ULTRA WOMENS) TABS Take 1 tablet by mouth daily.    unknown at Unknown time  . predniSONE (DELTASONE) 10 MG tablet Take 1 tablet (10 mg total) by mouth daily with breakfast. Take 6 tablets today and then decrease by 1 tablet daily until none are left. (Patient not taking: Reported on 07/24/2015) 21 tablet 0    Assessment: 70yo female with hypoxemia.  Asked to initiate Heparin for possible PE. Heparin level therapeutic.  Goal of Therapy:  Heparin level 0.3-0.7 units/ml Monitor platelets by anticoagulation protocol: Yes  Plan:  Continue Heparin infusion at 1000 units/hr Heparin level and CBC daily F/U venous US  Hart Robinsons A 07/25/2015,9:07 AM

## 2015-07-26 LAB — CBC
HEMATOCRIT: 39.6 % (ref 36.0–46.0)
Hemoglobin: 12 g/dL (ref 12.0–15.0)
MCH: 25.5 pg — ABNORMAL LOW (ref 26.0–34.0)
MCHC: 30.3 g/dL (ref 30.0–36.0)
MCV: 84.3 fL (ref 78.0–100.0)
PLATELETS: 105 10*3/uL — AB (ref 150–400)
RBC: 4.7 MIL/uL (ref 3.87–5.11)
RDW: 20.6 % — AB (ref 11.5–15.5)
WBC: 8.9 10*3/uL (ref 4.0–10.5)

## 2015-07-26 LAB — GLUCOSE, CAPILLARY
GLUCOSE-CAPILLARY: 80 mg/dL (ref 65–99)
Glucose-Capillary: 128 mg/dL — ABNORMAL HIGH (ref 65–99)
Glucose-Capillary: 85 mg/dL (ref 65–99)
Glucose-Capillary: 106 mg/dL — ABNORMAL HIGH (ref 65–99)

## 2015-07-26 LAB — BASIC METABOLIC PANEL
ANION GAP: 6 (ref 5–15)
BUN: 22 mg/dL — ABNORMAL HIGH (ref 6–20)
CALCIUM: 9.5 mg/dL (ref 8.9–10.3)
CO2: 25 mmol/L (ref 22–32)
Chloride: 107 mmol/L (ref 101–111)
Creatinine, Ser: 0.8 mg/dL (ref 0.44–1.00)
GFR calc Af Amer: >60 mL/min (ref >60)
GLUCOSE: 93 mg/dL (ref 65–99)
Potassium: 4.7 mmol/L (ref 3.5–5.1)
Sodium: 138 mmol/L (ref 135–145)

## 2015-07-26 LAB — HEPARIN LEVEL (UNFRACTIONATED): HEPARIN UNFRACTIONATED: 0.54 [IU]/mL (ref 0.30–0.70)

## 2015-07-26 MED ORDER — SODIUM CHLORIDE 0.9 % IV SOLN
INTRAVENOUS | Status: AC
  Filled 2015-07-26: qty 3

## 2015-07-26 MED ORDER — APIXABAN 5 MG PO TABS: 5.0000 mg | ORAL_TABLET | Freq: Two times a day (BID) | ORAL | Status: DC

## 2015-07-26 MED ORDER — APIXABAN 5 MG PO TABS
10.0000 mg | ORAL_TABLET | Freq: Two times a day (BID) | ORAL | Status: DC
  Administered 2015-07-26 – 2015-07-28 (×4): 10 mg via ORAL
  Filled 2015-07-26 (×4): qty 2

## 2015-07-26 NOTE — Progress Notes (Signed)
[facility] PROGRESS NOTE  RIANSHI ECKARDT [id number] DOB: Aug 02, 1945 DOA: 07/24/2015 PCP: Robert Bellow, MD  Assessment/Plan: Acute on chronic hypoxemic respiratory failure -CT scan shows evidence for acute pulmonary embolism, also atelectasis with possibly mucus plugging also possibly pneumonia.  -will request speech therapy evaluation as I suspect she may be aspirating. -We'll also request pulmonary evaluation to assist with mucus plugging. Will add flutter valve. -Continue oxygen supplementation as needed. -continue antibiotics for pneumonia  Acute pulmonary embolism and DVT -She will require anticoagulation for at least 6-9 months. -Discussed in detail with patient options for anticoagulation. She has chosen Eliquis.  Probable aspiration pneumonia -Continue Unasyn. -Speech therapy evaluation.  Solitary pulmonary nodule -We'll need repeat CT chest in 6 months.  COPD -Does not appear exacerbated at this point.  Code Status: Full code Family Communication: Patient only  Disposition Plan: To be determined   Consultants:  None   Antibiotics:  Unasyn   Subjective: Feels a little short of breath and weak  Objective: Filed Vitals:   07/26/15 0513 07/26/15 0740 07/26/15 1300 07/26/15 1353  BP: 139/81  107/41   Pulse: 70  98   Temp: 98 F (36.7 C)  97.4 F (36.3 C)   TempSrc: Oral  Oral   Resp: 16  16   Height:      Weight:      SpO2: 100% 99% 95% 98%    Intake/Output Summary (Last 24 hours) at 07/26/15 1700 Last data filed at 07/26/15 1200  Gross per 24 hour  Intake 3438.33 ml  Output      0 ml  Net 3438.33 ml   Filed Weights   07/24/15 1540  Weight: 66.679 kg (147 lb)    Exam:   General:  Alert, awake, oriented 3  Cardiovascular: Regular rate and rhythm  Respiratory: Fair air movement, decreased breath sounds, no wheezes or rhonchi  Abdomen: Soft, nontender, nondistended, positive bowel sounds  Extremities: No  clubbing, cyanosis or edema, positive pulses   Neurologic:  Grossly intact and nonfocal  Data Reviewed: Basic Metabolic Panel:  Recent Labs Lab 07/24/15 1611 07/25/15 0650 07/26/15 0613  NA 142 137 138  K 5.1 5.2* 4.7  CL 108 109 107  CO2 28 22 25   GLUCOSE 119* 254* 93  BUN 33* 30* 22*  CREATININE 1.10* 1.04* 0.80  CALCIUM 10.1 9.4 9.5   Liver Function Tests: No results for input(s): AST, ALT, ALKPHOS, BILITOT, PROT, ALBUMIN in the last 168 hours. No results for input(s): LIPASE, AMYLASE in the last 168 hours. No results for input(s): AMMONIA in the last 168 hours. CBC:  Recent Labs Lab 07/24/15 1611 07/25/15 0650 07/26/15 0613  WBC 9.0 7.0 8.9  NEUTROABS 4.9  --   --   HGB 13.8 12.0 12.0  HCT 46.6* 39.8 39.6  MCV 85.7 84.9 84.3  PLT 94* 99* 105*   Cardiac Enzymes:  Recent Labs Lab 07/24/15 1611  TROPONINI 0.03   BNP (last 3 results)  Recent Labs  08/25/14 1710 07/09/15 0040  BNP 886.0* 636.0*    ProBNP (last 3 results) No results for input(s): PROBNP in the last 8760 hours.  CBG:  Recent Labs Lab 07/25/15 1704 07/25/15 2018 07/26/15 0747 07/26/15 1102 07/26/15 1616  GLUCAP 147* 128* 85 106* 80    No results found for this or any previous visit (from the past 240 hour(s)).   Studies: Ct Angio Chest Pe W/cm &/or Wo Cm  07/25/2015  CLINICAL DATA:  Right lower extremity  deep venous thrombosis diagnosed this morning. Clinical concern for pulmonary embolism. EXAM: CT ANGIOGRAPHY CHEST WITH CONTRAST TECHNIQUE: Multidetector CT imaging of the chest was performed using the standard protocol during bolus administration of intravenous contrast. Multiplanar CT image reconstructions and MIPs were obtained to evaluate the vascular anatomy. CONTRAST:  145mL OMNIPAQUE IOHEXOL 350 MG/ML SOLN COMPARISON:  08/02/2013 unenhanced chest CT and Chest radiograph from one day prior. FINDINGS: Mediastinum/Nodes: The study is high quality for the evaluation of  pulmonary embolism. There is a curvilinear and slightly eccentric thrombus within the lobar pulmonary artery branch of the right lower lobe. No additional pulmonary emboli. Atherosclerotic nonaneurysmal thoracic aorta. Top-normal caliber main pulmonary artery (2.9 cm diameter). Mild cardiomegaly with dilated right ventricle and right atrium, not appreciably changed from prior chest CT. No pericardial fluid/thickening. Left anterior descending, left circumflex and right coronary atherosclerosis. Two lead left subclavian pacemaker with lead tips in the right atrium and right ventricular apex. Stable coarsely calcified 1.2 cm left thyroid lobe nodule. Fluid level in the mid thoracic esophagus. No pathologically enlarged axillary, mediastinal or hilar lymph nodes. Lungs/Pleura: No pneumothorax. No pleural effusion. Secretions nearly occlude the bilateral lower lobe bronchi. Complete right middle lobe and left lower lobe consolidation and near complete right lower lobe consolidation with significant volume loss in these lobes. Peripheral right upper lobe 10 x 8 mm sub solid pulmonary nodule (series 5/image 20), which may have increased from 6 x 5 mm on [id number] (uncertain if this represents the same nodule). Mosaic attenuation in the aerated portions of the lungs. Upper abdomen: Unremarkable. Musculoskeletal: No aggressive appearing focal osseous lesions. Mild degenerative changes in the thoracic spine. Review of the MIP images confirms the above findings. IMPRESSION: 1. Solitary lobar pulmonary embolus in the right lower lobe, which appears subacute. 2. Stable mild cardiomegaly with chronic dilatation of the right heart chambers. 3. Complete right middle lobe and left lower lobe consolidation and near complete right lower lobe consolidation with associated significant volume loss in these lobes, favor atelectasis, cannot exclude a component of aspiration or pneumonia given the near complete occlusion of the associated  bronchi by secretions/mucous plugging. 4. Fluid in the thoracic esophagus, suggesting esophageal dysmotility and/or gastroesophageal reflux. 5. Right upper lobe 10 mm subsolid pulmonary nodule. Initial follow-up with CT at 6-12 months is recommended to confirm persistence. If persistent, repeat CT is recommended every 2 years until 5 years of stability has been established. This recommendation follows the consensus statement: Guidelines for Management of Incidental Pulmonary Nodules Detected on CT Images:From the Fleischner Society 2017; published online before print (10.1148/radiol.IJ:2314499). 6. Three-vessel coronary atherosclerosis. Critical Value/emergent results were called by telephone at the time of interpretation on 07/25/2015 at 3:05 pm to Dr. Lelon Frohlich , who verbally acknowledged these results. Electronically Signed   By: Ilona Sorrel M.D.   On: 07/25/2015 15:06   US Venous Img Lower Bilateral  07/25/2015  CLINICAL DATA:  Hypoxemia.  History of PE. EXAM: BILATERAL LOWER EXTREMITY VENOUS DOPPLER ULTRASOUND TECHNIQUE: Gray-scale sonography with graded compression, as well as color Doppler and duplex ultrasound were performed to evaluate the lower extremity deep venous systems from the level of the common femoral vein and including the common femoral, femoral, profunda femoral, popliteal and calf veins including the posterior tibial, peroneal and gastrocnemius veins when visible. The superficial great saphenous vein was also interrogated. Spectral Doppler was utilized to evaluate flow at rest and with distal augmentation maneuvers in the common femoral, femoral and popliteal veins. COMPARISON:  Bilateral venous ultrasound, 07/09/2015 FINDINGS: RIGHT LOWER EXTREMITY Common Femoral Vein: No evidence of thrombus. Normal compressibility, respiratory phasicity and response to augmentation. Saphenofemoral Junction: No evidence of thrombus. Normal compressibility and flow on color Doppler imaging.  Profunda Femoral Vein: No evidence of thrombus. Normal compressibility and flow on color Doppler imaging. Femoral Vein: No evidence of thrombus. Normal compressibility, respiratory phasicity and response to augmentation. Popliteal Vein: There is noncompressible acute thrombus in the popliteal vein call partially occlusive. Calf Veins: Veins not visualized.  Additional thrombus suspected. Superficial Great Saphenous Vein: No evidence of thrombus. Normal compressibility and flow on color Doppler imaging. Venous Reflux:  None. Other Findings:  None. LEFT LOWER EXTREMITY Common Femoral Vein: No evidence of thrombus. Normal compressibility, respiratory phasicity and response to augmentation. Saphenofemoral Junction: No evidence of thrombus. Normal compressibility and flow on color Doppler imaging. Profunda Femoral Vein: No evidence of thrombus. Normal compressibility and flow on color Doppler imaging. Femoral Vein: No evidence of thrombus. Normal compressibility, respiratory phasicity and response to augmentation. Popliteal Vein: No evidence of thrombus. Normal compressibility, respiratory phasicity and response to augmentation. Calf Veins: Not visualized. Superficial Great Saphenous Vein: No evidence of thrombus. Normal compressibility and flow on color Doppler imaging. Venous Reflux:  None. Other Findings:  None. IMPRESSION: 1. Acute deep venous thrombosis in the right popliteal vein with possible additional thrombus into the calf veins, which were not visualized. This has developed since the prior lower extremity venous ultrasound. 2. No evidence of a left lower extremity deep venous thrombosis. Electronically Signed   By: Lajean Manes M.D.   On: 07/25/2015 10:06    Scheduled Meds: . ampicillin-sulbactam (UNASYN) IV  3 g Intravenous Q8H  . aspirin  81 mg Oral Daily  . guaiFENesin  1,200 mg Oral BID  . insulin aspart  0-5 Units Subcutaneous QHS  . insulin aspart  0-9 Units Subcutaneous TID WC  .  ipratropium-albuterol  3 mL Nebulization TID  . sodium chloride flush  3 mL Intravenous Q12H   Continuous Infusions: . sodium chloride 100 mL/hr at 07/25/15 1243  . sodium chloride 100 mL/hr at 07/24/15 2122    Active Problems:   Acute respiratory failure with hypoxia (HCC)   Pacemaker   SSS (sick sinus syndrome) (HCC)   COPD with acute exacerbation (HCC)   Thrombocytopenia (HCC)   Diabetes mellitus with complication (HCC)   PAF (paroxysmal atrial fibrillation) ([place])   Acute pulmonary embolism (HCC)   DVT (deep venous thrombosis) ([place])    Time spent: 25 minutes. Greater than 50% of this time was spent in direct contact with the patient coordinating care.    Lelon Frohlich  [facility] Pager [phone]  If 7PM-7AM, please contact night-coverage at [email or web address], password [facility] 07/26/2015, 5:00 PM  LOS: 1 day

## 2015-07-26 NOTE — Discharge Instructions (Signed)
Information on my medicine - ELIQUIS (apixaban)  This medication education was reviewed with me or my healthcare representative as part of my discharge preparation.  The pharmacist that spoke with me during my hospital stay was:  Ena Dawley, [facility]  Why was Eliquis prescribed for you? Eliquis was prescribed to treat blood clots that may have been found in the veins of your legs (deep vein thrombosis) or in your lungs (pulmonary embolism) and to reduce the risk of them occurring again.  What do You need to know about Eliquis ? The starting dose is 10 mg (two 5 mg tablets) taken TWICE daily for the FIRST SEVEN (7) DAYS, then on (enter date)  08/02/15  the dose is reduced to ONE 5 mg tablet taken TWICE daily.  Eliquis may be taken with or without food.   Try to take the dose about the same time in the morning and in the evening. If you have difficulty swallowing the tablet whole please discuss with your pharmacist how to take the medication safely.  Take Eliquis exactly as prescribed and DO NOT stop taking Eliquis without talking to the doctor who prescribed the medication.  Stopping may increase your risk of developing a new blood clot.  Refill your prescription before you run out.  After discharge, you should have regular check-up appointments with your healthcare provider that is prescribing your Eliquis.    What do you do if you miss a dose? If a dose of ELIQUIS is not taken at the scheduled time, take it as soon as possible on the same day and twice-daily administration should be resumed. The dose should not be doubled to make up for a missed dose.  Important Safety Information A possible side effect of Eliquis is bleeding. You should call your healthcare provider right away if you experience any of the following: ? Bleeding from an injury or your nose that does not stop. ? Unusual colored urine (red or dark brown) or unusual colored stools (red or black). ? Unusual bruising for  unknown reasons. ? A serious fall or if you hit your head (even if there is no bleeding).  Some medicines may interact with Eliquis and might increase your risk of bleeding or clotting while on Eliquis. To help avoid this, consult your healthcare provider or pharmacist prior to using any new prescription or non-prescription medications, including herbals, vitamins, non-steroidal anti-inflammatory drugs (NSAIDs) and supplements.  This website has more information on Eliquis (apixaban): http://www.eliquis.com/eliquis/home

## 2015-07-26 NOTE — Progress Notes (Signed)
ANTICOAGULATION CONSULT NOTE - follow up  Pharmacy Consult for HEPARIN Indication: pulmonary embolus  Allergies  Allergen Reactions  . Prednisone Other (See Comments)    Chest pain   Patient Measurements: Height: 5\' 1"  (154.9 cm) Weight: 147 lb (66.679 kg) IBW/kg (Calculated) : 47.8 HEPARIN DW (KG): 61.8  Vital Signs: Temp: 98 F (36.7 C) (04/23 0513) Temp Source: Oral (04/23 0513) BP: 139/81 mmHg (04/23 0513) Pulse Rate: 70 (04/23 0513)  Labs:  Recent Labs  07/24/15 1610  07/24/15 1611 07/25/15 0650 07/26/15 0613  HGB  --   < > 13.8 12.0 12.0  HCT  --   --  46.6* 39.8 39.6  PLT  --   --  94* 99* 105*  APTT 26  --   --   --   --   LABPROT 13.4  --   --   --   --   INR 1.00  --   --   --   --   HEPARINUNFRC  --   --   --  0.56 0.54  CREATININE  --   --  1.10* 1.04* 0.80  TROPONINI  --   --  0.03  --   --   < > = values in this interval not displayed. Estimated Creatinine Clearance: 57.2 mL/min (by C-G formula based on Cr of 0.8).  Medical History: Past Medical History  Diagnosis Date  . Sleep apnea     uses cpap  . Peripheral vascular disease ([place])   . GERD (gastroesophageal reflux disease)   . Neuromuscular disorder ([place])     HX of MD  . Diabetes ([place])   . Sinus node dysfunction (HCC)   . H/O cardiac pacemaker   . Tobacco abuse   . Hyperlipidemia   . SSS (sick sinus syndrome) ([place])   . DDD (degenerative disc disease), lumbar   . On home O2     2L N/C  . COPD (chronic obstructive pulmonary disease) (HCC)    Medications:  Prescriptions prior to admission  Medication Sig Dispense Refill Last Dose  . albuterol (PROVENTIL) (2.5 MG/3ML) 0.083% nebulizer solution Take 2.5 mg by nebulization every 6 (six) hours as needed for wheezing or shortness of breath.    07/24/2015 at Unknown time  . aspirin 81 MG tablet Take 1 tablet (81 mg total) by mouth daily. 30 tablet  07/24/2015 at Unknown time  . guaiFENesin (MUCINEX) 600 MG 12 hr tablet Take 1 tablet (600 mg  total) by mouth 2 (two) times daily. 30 tablet 0 07/24/2015 at Unknown time  . oxyCODONE-acetaminophen (PERCOCET) 10-325 MG per tablet Take 1 tablet by mouth 4 (four) times daily as needed for pain.    07/24/2015 at Unknown time  . OXYGEN Inhale 2 L into the lungs continuous.   07/24/2015 at Unknown time  . senna-docusate (SENOKOT-S) 8.6-50 MG tablet Take 2 tablets by mouth daily as needed for mild constipation.    Past Week at Unknown time  . solifenacin (VESICARE) 5 MG tablet Take 5 mg by mouth daily.   Past Month at Unknown time  . levofloxacin (LEVAQUIN) 750 MG tablet Take 1 tablet (750 mg total) by mouth daily. (Patient not taking: Reported on 07/24/2015) 5 tablet 0   . Multiple Vitamins-Minerals (CENTRUM SILVER ULTRA WOMENS) TABS Take 1 tablet by mouth daily.    unknown at Unknown time  . predniSONE (DELTASONE) 10 MG tablet Take 1 tablet (10 mg total) by mouth daily with breakfast. Take 6 tablets today and  then decrease by 1 tablet daily until none are left. (Patient not taking: Reported on 07/24/2015) 21 tablet 0    Assessment: 70yo female with hypoxemia.  Asked to initiate Heparin for possible PE. Heparin level therapeutic.  CBC OK, platelets slightly improved.   Goal of Therapy:  Heparin level 0.3-0.7 units/ml Monitor platelets by anticoagulation protocol: Yes   Plan:  Continue Heparin infusion at 1000 units/hr Heparin level and CBC daily F/U plans for oral anticoagulation  Hart Robinsons A 07/26/2015,8:10 AM

## 2015-07-26 NOTE — Progress Notes (Signed)
ANTICOAGULATION CONSULT NOTE - follow up  Pharmacy Consult for HEPARIN >>> ELIQUIS Indication: pulmonary embolus  Allergies  Allergen Reactions  . Prednisone Other (See Comments)    Chest pain   Patient Measurements: Height: 5\' 1"  (154.9 cm) Weight: 147 lb (66.679 kg) IBW/kg (Calculated) : 47.8 HEPARIN DW (KG): 61.8  Vital Signs: Temp: 97.4 F (36.3 C) (04/23 1300) Temp Source: Oral (04/23 1300) BP: 107/41 mmHg (04/23 1300) Pulse Rate: 98 (04/23 1300)  Labs:  Recent Labs  07/24/15 1610  07/24/15 1611 07/25/15 0650 07/26/15 0613  HGB  --   < > 13.8 12.0 12.0  HCT  --   --  46.6* 39.8 39.6  PLT  --   --  94* 99* 105*  APTT 26  --   --   --   --   LABPROT 13.4  --   --   --   --   INR 1.00  --   --   --   --   HEPARINUNFRC  --   --   --  0.56 0.54  CREATININE  --   --  1.10* 1.04* 0.80  TROPONINI  --   --  0.03  --   --   < > = values in this interval not displayed. Estimated Creatinine Clearance: 57.2 mL/min (by C-G formula based on Cr of 0.8).  Medical History: Past Medical History  Diagnosis Date  . Sleep apnea     uses cpap  . Peripheral vascular disease ([place])   . GERD (gastroesophageal reflux disease)   . Neuromuscular disorder ([place])     HX of MD  . Diabetes ([place])   . Sinus node dysfunction (HCC)   . H/O cardiac pacemaker   . Tobacco abuse   . Hyperlipidemia   . SSS (sick sinus syndrome) ([place])   . DDD (degenerative disc disease), lumbar   . On home O2     2L N/C  . COPD (chronic obstructive pulmonary disease) (HCC)    Medications:  Prescriptions prior to admission  Medication Sig Dispense Refill Last Dose  . albuterol (PROVENTIL) (2.5 MG/3ML) 0.083% nebulizer solution Take 2.5 mg by nebulization every 6 (six) hours as needed for wheezing or shortness of breath.    07/24/2015 at Unknown time  . aspirin 81 MG tablet Take 1 tablet (81 mg total) by mouth daily. 30 tablet  07/24/2015 at Unknown time  . guaiFENesin (MUCINEX) 600 MG 12 hr tablet Take 1  tablet (600 mg total) by mouth 2 (two) times daily. 30 tablet 0 07/24/2015 at Unknown time  . oxyCODONE-acetaminophen (PERCOCET) 10-325 MG per tablet Take 1 tablet by mouth 4 (four) times daily as needed for pain.    07/24/2015 at Unknown time  . OXYGEN Inhale 2 L into the lungs continuous.   07/24/2015 at Unknown time  . senna-docusate (SENOKOT-S) 8.6-50 MG tablet Take 2 tablets by mouth daily as needed for mild constipation.    Past Week at Unknown time  . solifenacin (VESICARE) 5 MG tablet Take 5 mg by mouth daily.   Past Month at Unknown time  . levofloxacin (LEVAQUIN) 750 MG tablet Take 1 tablet (750 mg total) by mouth daily. (Patient not taking: Reported on 07/24/2015) 5 tablet 0   . Multiple Vitamins-Minerals (CENTRUM SILVER ULTRA WOMENS) TABS Take 1 tablet by mouth daily.    unknown at Unknown time  . predniSONE (DELTASONE) 10 MG tablet Take 1 tablet (10 mg total) by mouth daily with breakfast. Take 6 tablets  today and then decrease by 1 tablet daily until none are left. (Patient not taking: Reported on 07/24/2015) 21 tablet 0    Assessment: 70yo female with hypoxemia.  Asked to initiate Heparin for possible PE.  Heparin level therapeutic.  CBC OK, platelets slightly improved.  Asked to transition to Eliquis.  Goal of Therapy:  Heparin level 0.3-0.7 units/ml Monitor platelets by anticoagulation protocol: Yes   Plan:  Eliquis 10mg  PO BID x 7 days then 5mg  po BID Monitor for s/sx of bleeding complications Provide Eliquis education  Hart Robinsons A 07/26/2015,6:35 PM

## 2015-07-27 LAB — BASIC METABOLIC PANEL
ANION GAP: 6 (ref 5–15)
BUN: 20 mg/dL (ref 6–20)
CO2: 30 mmol/L (ref 22–32)
Calcium: 9.7 mg/dL (ref 8.9–10.3)
Chloride: 104 mmol/L (ref 101–111)
Creatinine, Ser: 0.84 mg/dL (ref 0.44–1.00)
GFR calc Af Amer: >60 mL/min (ref >60)
GFR calc non Af Amer: >60 mL/min (ref >60)
GLUCOSE: 81 mg/dL (ref 65–99)
POTASSIUM: 5.1 mmol/L (ref 3.5–5.1)
Sodium: 140 mmol/L (ref 135–145)

## 2015-07-27 LAB — GLUCOSE, CAPILLARY
GLUCOSE-CAPILLARY: 122 mg/dL — AB (ref 65–99)
Glucose-Capillary: 67 mg/dL (ref 65–99)
Glucose-Capillary: 99 mg/dL (ref 65–99)
Glucose-Capillary: 84 mg/dL (ref 65–99)

## 2015-07-27 LAB — URINE CULTURE

## 2015-07-27 LAB — HEMOGLOBIN A1C
HEMOGLOBIN A1C: 6.3 % — AB (ref 4.8–5.6)
MEAN PLASMA GLUCOSE: 134 mg/dL

## 2015-07-27 NOTE — Evaluation (Signed)
Clinical/Bedside Swallow Evaluation Patient Details  Name: JOPLIN KAMERER MRN: [id number] Date of Birth: 1945/08/08  Today's Date: 07/27/2015 Time: SLP Start Time (ACUTE ONLY): 1300 SLP Stop Time (ACUTE ONLY): 1340 SLP Time Calculation (min) (ACUTE ONLY): 40 min  Past Medical History:  Past Medical History  Diagnosis Date  . Sleep apnea     uses cpap  . Peripheral vascular disease ([place])   . GERD (gastroesophageal reflux disease)   . Neuromuscular disorder ([place])     HX of MD  . Diabetes ([place])   . Sinus node dysfunction (HCC)   . H/O cardiac pacemaker   . Tobacco abuse   . Hyperlipidemia   . SSS (sick sinus syndrome) ([place])   . DDD (degenerative disc disease), lumbar   . On home O2     2L N/C  . COPD (chronic obstructive pulmonary disease) ([place])    Past Surgical History:  Past Surgical History  Procedure Laterality Date  . Insert / replace / remove pacemaker    . Cholecystectomy    . Abdominal hysterectomy    . Tonsillectomy    . Tubal ligation    . Femoral artery stent  02/18/2011  . Esophagogastroduodenoscopy  04/13/2002    A short segment of salmon-colored epithelium in the distal esophagus  consistent with Barrett's esophagus, biopsied/ The remainder of the esophageal mucosa, stomach, and duodenum through the second portion appeared normal  . Colonoscopy  04/13/2002    Diminutive polyps in the rectum cold biopsied/removed/ The remainder of the rectal mucosa and colon appeared normal.  . Lower extremity angiogram N/A 02/18/2011    Procedure: LOWER EXTREMITY ANGIOGRAM;  Surgeon: Lorretta Harp, MD;  Location: [facility] CATH LAB;  Service: Cardiovascular;  Laterality: N/A;  . Lower extremity angiogram N/A 10/29/2012    Procedure: LOWER EXTREMITY ANGIOGRAM;  Surgeon: Lorretta Harp, MD;  Location: [facility] CATH LAB;  Service: Cardiovascular;  Laterality: N/A;  . Abdominal angiogram  10/29/2012    Procedure: ABDOMINAL ANGIOGRAM;  Surgeon: Lorretta Harp, MD;  Location: [facility] CATH LAB;   Service: Cardiovascular;;  . Implantable cardioverter defibrillator generator change Left 10/29/2013    Procedure: IMPLANTABLE CARDIOVERTER DEFIBRILLATOR GENERATOR CHANGE;  Surgeon: Sanda Klein, MD;  Location: [facility] CATH LAB;  Service: Cardiovascular;  Laterality: Left;  . Transthoracic echocardiogram  10/05/2011    EF 50-55% Pacemaker present,   . Transthoracic echocardiogram  05/20/2010    EF=>55%, pacer enduced LBBB, conduction abnormality  . Nm myocar perf wall motion  10/05/2011    Protocol:Lexiscan, mild perfusion defect due to pacing induced artifact  . Nm myocar perf wall motion  05/20/2010    Protocol:Persantine, EF78%, normal perfusion in all regions,   . Colonoscopy with propofol N/A 01/23/2015    Procedure: COLONOSCOPY WITH PROPOFOL;  Surgeon: Rogene Houston, MD;  Location: [facility] ORS;  Service: Endoscopy;  Laterality: N/A;  [place] time in 0814  time out  0837  total time 23 minutes  . Esophagogastroduodenoscopy (egd) with propofol N/A 01/23/2015    Procedure: ESOPHAGOGASTRODUODENOSCOPY (EGD) WITH PROPOFOL;  Surgeon: Rogene Houston, MD;  Location: [facility] ORS;  Service: Endoscopy;  Laterality: N/A;  procedure 1  . Esophageal dilation N/A 01/23/2015    Procedure: ESOPHAGEAL DILATION;  Surgeon: Rogene Houston, MD;  Location: [facility] ORS;  Service: Endoscopy;  Laterality: N/A;  [place] 79  . Biopsy N/A 01/23/2015    Procedure: BIOPSY;  Surgeon: Rogene Houston, MD;  Location: [facility] ORS;  Service: Endoscopy;  Laterality: N/A;  duodenal and  esophageal  . Polypectomy N/A 01/23/2015    Procedure: POLYPECTOMY;  Surgeon: Rogene Houston, MD;  Location: [facility] ORS;  Service: Endoscopy;  Laterality: N/A;  cecal, hepatic flexure, distal transverse colon   HPI:  SAKILE FILAR is a 70 y.o. female with medical history significant of OSA, PAF. PVD s/p bilat LE stent placement, GERD, DM, SSS, HLD, Tobacco use, pacemaker, COPD O2 Dependency. NEw Hypoxemia. Patient on O2 at home. Despite baseline 2 L nasal cannula  patient noted to be hypoxic in the 80s. This was relieved by increasing O2 2-3 L. This is concerning the home health care staff who referred patient here to the emergency room. Patient states that ever since being discharged from the hospital for treatment for CAP and COPD exacerbation. She is progressively improved from a respiratory standpoint. Of note she is admitted for left lower lobe 3 acquired pneumonia. Patient completed her steroid taper and antibiotics a few days ago. Patient denies any fevers, productive cough, largely swelling, chest pain, sensation of shortness of breath, abdominal pain, dysuria, frequency, back pain, rash, diarrhea, palpitations, neck stiffness, headache. SLP asked to complete BSE due to persistent PNA.   Assessment / Plan / Recommendation Clinical Impression  Pt reports that she has had PNA four times in the past year and reports choking on "loaf bread" last week (enough so that son had to do Heimlich), but adamantly denies difficulty swallowing. Pt is edentulous, but has dentures at home. Pt shows no overt signs/symptoms of aspiration at bedside except for delayed cough x1 after sip thin liquid. Pt reports that she has been coughing all day and has mucus she "can't bring up". SLP explained correlation between COPD and silent aspiration (difficulty coordinating respiration with swallow and decreased sensitization pharynx/larynx) and recommended instrumental testing via MBSS to objectively evaluate swallow given repeat PNA in setting of COPD. Pt adamantly refuses MBSS due to use of barium (pt states she cannot tolerate this from a GI standpoint). SLP explained that the amount of barium utilized is minimal compared to other GI studies. Pt continues to refuse (nicely) and reiterates that she doesn't think she is aspirating. Will downgrade textures to D3/mech soft and continue thin liquids with strategies (small bites/sips, masticate thoroughly, alternate solids/liquids) and SLP to  follow for diet tolerance. Will discuss with MD. Above to RN.    Aspiration Risk  Mild aspiration risk    Diet Recommendation Dysphagia 3 (Mech soft);Thin liquid   Liquid Administration via: Cup;Straw Medication Administration: Whole meds with liquid Supervision: Patient able to self feed;Intermittent supervision to cue for compensatory strategies Compensations: Slow rate;Small sips/bites;Follow solids with liquid Postural Changes: Seated upright at 90 degrees;Remain upright for at least 30 minutes after po intake    Other  Recommendations Oral Care Recommendations: Oral care BID;Patient independent with oral care Other Recommendations: Clarify dietary restrictions   Follow up Recommendations  Home health SLP    Frequency and Duration min 2x/week  1 week       Prognosis Prognosis for Safe Diet Advancement: Good      Swallow Study   General Date of Onset: 07/24/15 HPI: BREEZY DAIGNAULT is a 70 y.o. female with medical history significant of OSA, PAF. PVD s/p bilat LE stent placement, GERD, DM, SSS, HLD, Tobacco use, pacemaker, COPD O2 Dependency. NEw Hypoxemia. Patient on O2 at home. Despite baseline 2 L nasal cannula patient noted to be hypoxic in the 80s. This was relieved by increasing O2 2-3 L. This is concerning the home  health care staff who referred patient here to the emergency room. Patient states that ever since being discharged from the hospital for treatment for CAP and COPD exacerbation. She is progressively improved from a respiratory standpoint. Of note she is admitted for left lower lobe 3 acquired pneumonia. Patient completed her steroid taper and antibiotics a few days ago. Patient denies any fevers, productive cough, largely swelling, chest pain, sensation of shortness of breath, abdominal pain, dysuria, frequency, back pain, rash, diarrhea, palpitations, neck stiffness, headache. SLP asked to complete BSE due to persistent PNA. Type of Study: Bedside Swallow  Evaluation Previous Swallow Assessment: EGD 01/2015: short segmet Barrett's esophagus Diet Prior to this Study: Regular;Thin liquids Temperature Spikes Noted: No Respiratory Status: Nasal cannula History of Recent Intubation: No Behavior/Cognition: Alert;Cooperative;Pleasant mood Oral Cavity Assessment: Within Functional Limits Oral Care Completed by SLP: No Oral Cavity - Dentition: Edentulous Vision: Functional for self-feeding Self-Feeding Abilities: Able to feed self Patient Positioning: Upright in bed Baseline Vocal Quality: Normal Volitional Cough: Strong;Congested Volitional Swallow: Able to elicit    Oral/Motor/Sensory Function Overall Oral Motor/Sensory Function: Within functional limits   Ice Chips Ice chips: Within functional limits Presentation: Spoon   Thin Liquid Thin Liquid: Within functional limits Presentation: Cup;Self Fed;Straw Other Comments:  (delayed congested cough x1 which pt attributes to phlegm)    Nectar Thick Nectar Thick Liquid: Not tested   Honey Thick Honey Thick Liquid: Not tested   Puree Puree: Not tested   Solid   GO   Solid: Within functional limits Presentation: Self Fed Other Comments:  (pt edentulous, but able to masticate without dentures)        Alieu Finnigan 07/27/2015,2:58 PM

## 2015-07-27 NOTE — Progress Notes (Signed)
Pharmacy Antibiotic Note  [name] is a 70 y.o. female admitted on 07/24/2015 with pneumonia.  Pharmacy has been consulted for UNASYN dosing.  Currently afebrile. SCr improved.   Plan:  Unasyn 3gm IVq8h  Consider switch to Augmentin PO when OK with MD  Monitor labs, renal fxn, progress and c/s  Height: 5\' 1"  (154.9 cm) Weight: 147 lb (66.679 kg) IBW/kg (Calculated) : 47.8  Temp (24hrs), Avg:97.6 F (36.4 C), Min:97.4 F (36.3 C), Max:97.8 F (36.6 C)   Recent Labs Lab 07/24/15 1611 07/24/15 1858 07/25/15 0650 07/26/15 0613 07/27/15 0456  WBC 9.0  --  7.0 8.9  --   CREATININE 1.10*  --  1.04* 0.80 0.84  LATICACIDVEN 1.0 3.1*  --   --   --     Estimated Creatinine Clearance: 54.5 mL/min (by C-G formula based on Cr of 0.84).    Allergies  Allergen Reactions  . Prednisone Other (See Comments)    Chest pain   Anti-infectives    Start     Dose/Rate Route Frequency Ordered Stop   07/25/15 1700  Ampicillin-Sulbactam (UNASYN) 3 g in sodium chloride 0.9 % 100 mL IVPB     3 g 100 mL/hr over 60 Minutes Intravenous Every 8 hours 07/25/15 1617     07/24/15 2300  doxycycline (VIBRAMYCIN) 100 mg in dextrose 5 % 250 mL IVPB  Status:  Discontinued     100 mg 125 mL/hr over 120 Minutes Intravenous Every 12 hours 07/24/15 2215 07/26/15 0959     Microbiology results: 4/21 UCx: pending   Thank you for allowing pharmacy to be a part of this patient's care.  Hart Robinsons A 07/27/2015 7:48 AM

## 2015-07-27 NOTE — Care Management Important Message (Signed)
Important Message  Patient Details  Name: [name] MRN: [id number] Date of Birth: 10-24-1945   Medicare Important Message Given:  Yes    Sherald Barge, RN 07/27/2015, 1:03 PM

## 2015-07-27 NOTE — Care Management Note (Signed)
Case Management Note  Patient Details  Name: [name] MRN: [id number] Date of Birth: 08/08/1945  Subjective/Objective:                  Pt is from home, lives with son and receives [facility]  RN, aid, PT services through [place]. Pt has P. E. And will need Eliquis at DC. Pt given 30-day free voucher, benefits check completed and pt given info. Pt plans has home O2 and all necessary DME. Pt plans to return home with resumption of [place] services through [place]. Pt made aware HH has 48 hours to resume services.    Action/Plan: Towanda Malkin, of [facility], made aware of DC plan and potential for DC home today and will obtain pt info from chart. No further CM needs.  Expected Discharge Date:  07/27/15               Expected Discharge Plan:  [place]  In-House Referral:  NA  Discharge planning Services  CM Consult  Post Acute Care Choice:  Home Health, Resumption of Svcs/PTA Provider Choice offered to:  Patient  DME Arranged:    DME Agency:     HH Arranged:    [place] Agency:     Status of Service:  Completed, signed off  Medicare Important Message Given:  Yes Date Medicare IM Given:    Medicare IM give by:    Date Additional Medicare IM Given:    Additional Medicare Important Message give by:     If discussed at [place] of Stay Meetings, dates discussed:    Additional Comments:  Sherald Barge, RN 07/27/2015, 1:04 PM

## 2015-07-27 NOTE — Progress Notes (Signed)
[facility] PROGRESS NOTE  KATARENA FINER [id number] DOB: 12-04-1945 DOA: 07/24/2015 PCP: Robert Bellow, MD  Assessment/Plan: Acute on chronic hypoxemic respiratory failure -CT scan shows evidence for acute pulmonary embolism, also atelectasis with possibly mucus plugging also possibly pneumonia.  -will request speech therapy evaluation as I suspect she may be aspirating. -We'll also request pulmonary evaluation to assist with mucus plugging. Will add flutter valve. -Continue oxygen supplementation as needed. -Continue antibiotics for pneumonia  Acute pulmonary embolism and DVT -Continue eliquis.  Probable aspiration pneumonia -Continue Unasyn. -Speech therapy evaluation.  Solitary pulmonary nodule -We'll need repeat CT chest in 6 months.  COPD -Does not appear exacerbated at this point.  Code Status: Full code Family Communication: Patient only  Disposition Plan: To be determined; PT evaluation   Consultants:  None   Antibiotics:  Unasyn   Subjective: Feels a little short of breath and weak  Objective: Filed Vitals:   07/27/15 0446 07/27/15 0756 07/27/15 1317 07/27/15 1436  BP: 128/64  128/68   Pulse: 70  70   Temp: 97.5 F (36.4 C)  97.9 F (36.6 C)   TempSrc: Oral  Oral   Resp: 18  20   Height:      Weight:      SpO2: 100% 97% 98% 99%    Intake/Output Summary (Last 24 hours) at 07/27/15 1528 Last data filed at 07/27/15 0700  Gross per 24 hour  Intake   1360 ml  Output      0 ml  Net   1360 ml   Filed Weights   07/24/15 1540  Weight: 66.679 kg (147 lb)    Exam:   General:  Alert, awake, oriented 3  Cardiovascular: Regular rate and rhythm  Respiratory: Fair air movement, decreased breath sounds, no wheezes or rhonchi  Abdomen: Soft, nontender, nondistended, positive bowel sounds  Extremities: No clubbing, cyanosis or edema, positive pulses   Neurologic:  Grossly intact and nonfocal  Data Reviewed: Basic  Metabolic Panel:  Recent Labs Lab 07/24/15 1611 07/25/15 0650 07/26/15 0613 07/27/15 0456  NA 142 137 138 140  K 5.1 5.2* 4.7 5.1  CL 108 109 107 104  CO2 28 22 25 30   GLUCOSE 119* 254* 93 81  BUN 33* 30* 22* 20  CREATININE 1.10* 1.04* 0.80 0.84  CALCIUM 10.1 9.4 9.5 9.7   Liver Function Tests: No results for input(s): AST, ALT, ALKPHOS, BILITOT, PROT, ALBUMIN in the last 168 hours. No results for input(s): LIPASE, AMYLASE in the last 168 hours. No results for input(s): AMMONIA in the last 168 hours. CBC:  Recent Labs Lab 07/24/15 1611 07/25/15 0650 07/26/15 0613  WBC 9.0 7.0 8.9  NEUTROABS 4.9  --   --   HGB 13.8 12.0 12.0  HCT 46.6* 39.8 39.6  MCV 85.7 84.9 84.3  PLT 94* 99* 105*   Cardiac Enzymes:  Recent Labs Lab 07/24/15 1611  TROPONINI 0.03   BNP (last 3 results)  Recent Labs  08/25/14 1710 07/09/15 0040  BNP 886.0* 636.0*    ProBNP (last 3 results) No results for input(s): PROBNP in the last 8760 hours.  CBG:  Recent Labs Lab 07/26/15 0747 07/26/15 1102 07/26/15 1616 07/27/15 0715 07/27/15 1129  GLUCAP 85 106* 80 67 99    Recent Results (from the past 240 hour(s))  Urine culture     Status: None   Collection Time: 07/24/15  8:20 PM  Result Value Ref Range Status   Specimen Description URINE, CLEAN CATCH  Final   Special Requests NONE  Final   Culture MULTIPLE SPECIES PRESENT, SUGGEST RECOLLECTION  Final   Report Status 07/27/2015 FINAL  Final     Studies: No results found.  Scheduled Meds: . ampicillin-sulbactam (UNASYN) IV  3 g Intravenous Q8H  . apixaban  10 mg Oral BID   Followed by  . [START ON 08/02/2015] apixaban  5 mg Oral BID  . aspirin  81 mg Oral Daily  . guaiFENesin  1,200 mg Oral BID  . insulin aspart  0-5 Units Subcutaneous QHS  . insulin aspart  0-9 Units Subcutaneous TID WC  . ipratropium-albuterol  3 mL Nebulization TID  . sodium chloride flush  3 mL Intravenous Q12H   Continuous Infusions: . sodium  chloride 100 mL/hr at 07/27/15 1203  . sodium chloride 100 mL/hr at 07/24/15 2122    Active Problems:   Acute respiratory failure with hypoxia (HCC)   Pacemaker   SSS (sick sinus syndrome) (HCC)   COPD with acute exacerbation (HCC)   Thrombocytopenia (HCC)   Diabetes mellitus with complication (HCC)   PAF (paroxysmal atrial fibrillation) ([place])   Acute pulmonary embolism (HCC)   DVT (deep venous thrombosis) ([place])    Time spent: 25 minutes. Greater than 50% of this time was spent in direct contact with the patient coordinating care.    Lelon Frohlich  [facility] Pager [phone]  If 7PM-7AM, please contact night-coverage at [email or web address], password [facility] 07/27/2015, 3:28 PM  LOS: 2 days

## 2015-07-28 DIAGNOSIS — I2692 Saddle embolus of pulmonary artery without acute cor pulmonale: Secondary | ICD-10-CM

## 2015-07-28 LAB — GLUCOSE, CAPILLARY: Glucose-Capillary: 114 mg/dL — ABNORMAL HIGH (ref 65–99)

## 2015-07-28 LAB — CBC
HCT: 43.4 % (ref 36.0–46.0)
Hemoglobin: 12.3 g/dL (ref 12.0–15.0)
MCH: 24.3 pg — ABNORMAL LOW (ref 26.0–34.0)
MCHC: 28.3 g/dL — AB (ref 30.0–36.0)
MCV: 85.6 fL (ref 78.0–100.0)
PLATELETS: 129 10*3/uL — AB (ref 150–400)
RBC: 5.07 MIL/uL (ref 3.87–5.11)
RDW: 21.1 % — AB (ref 11.5–15.5)
WBC: 8.5 10*3/uL (ref 4.0–10.5)

## 2015-07-28 MED ORDER — CLINDAMYCIN HCL 300 MG PO CAPS: 300.0000 mg | ORAL_CAPSULE | Freq: Three times a day (TID) | ORAL | Status: DC

## 2015-07-28 MED ORDER — APIXABAN 5 MG PO TABS: 10.0000 mg | ORAL_TABLET | Freq: Two times a day (BID) | ORAL | Status: DC

## 2015-07-28 MED ORDER — APIXABAN 5 MG PO TABS: 5.0000 mg | ORAL_TABLET | Freq: Two times a day (BID) | ORAL | Status: AC

## 2015-07-28 NOTE — Plan of Care (Signed)
Problem: Discharge Progression Outcomes Goal: Independent ADLs or Home Health Care Outcome: Completed/Met Date Met:  07/28/15 Home health

## 2015-07-28 NOTE — Evaluation (Signed)
Physical Therapy Evaluation Patient Details Name: [name] MRN: [id number] DOB: Jul 08, 1945 Today's Date: 07/28/2015   History of Present Illness  70 yo F admitted with new hypoxemia, on O2 at home.  Chest CT 4/22 showed subacute PE, atelectasis and possible mucous plugging and PNA - pt on eliquis.  LE doppler also showed acute DVT in R popliteal vein down into calf veins, L LE (-) for DVT.  PMH: DM, sick sinus syndrom, PVD, pacemaker, COPD, DDD, femoral artery stent.   Clinical Impression  Pt received sitting on the EOB after using the [facility] with nursing, and pt was agreeable to PT evaluation.  Pt required Min A with RW for sit<>stand transfer, and Min A with RW for transfer bed<>chair.  No further gait at this time due to pt's fatigue level.  Pt normally lives with her 2 sons who care for her.  One son is still in a subacute rehab facility, and is about to be released back home.  Pt states she was receiving [place] services, including PT. She states that she has had multiple falls within the last 6 months, and has not been able to ambulate to the bathroom (~54ft) since her last admission ~2wks ago.  At this point, she may be a candidate for SNF due to her risk for falling, combined with decreased, strength and endurance.  This was not discussed during evaluation today, and if pt not agreeable to SNF, then she will need to resume HHPT.     Follow Up Recommendations Home health PT;SNF    Equipment Recommendations  None recommended by PT    Recommendations for Other Services       Precautions / Restrictions Precautions Precautions: Fall Precaution Comments: Due generalized weakness, limited mobility and recent hospitalizations.  Pt states that she has had multiple falls within the last 6 months, and states that mostly its because her LE's go out from under her.  Pt states that she doesn't dare goe into her kitchen because the floor is so slick that she falls every time.   Restrictions Weight  Bearing Restrictions: No      Mobility  Bed Mobility Overal bed mobility:  (Not assessed - pt sitting on EOB upon arrival, and left sitting in the chair. )                Transfers Overall transfer level: Needs assistance Equipment used: Rolling walker (2 wheeled) Transfers: Sit to/from [facility] Sit to Stand: Min guard Stand pivot transfers: Min guard       General transfer comment: pt able to perform sit<>stand transer x2 trials, and then took a few steps to transfer into the recliner. During these transfers, pt is very fearful of falling, stating "Now you're going to have to lift me, I'm going to fall.  I can't walk, I'm going to fall."   Ambulation/Gait                Stairs            Wheelchair Mobility    Modified Rankin (Stroke Patients Only)       Balance Overall balance assessment: Needs assistance Sitting-balance support: Bilateral upper extremity supported Sitting balance-Leahy Scale: Good     Standing balance support: Bilateral upper extremity supported Standing balance-Leahy Scale: Fair                               Pertinent Vitals/Pain Pain  Assessment: 0-10 Pain Score: 5  Pain Location: back - chronic  Pain Descriptors / Indicators: Aching Pain Intervention(s): Repositioned    Home Living Family/patient expects to be discharged to:: Private residence Living Arrangements: Children (Pt lives with her 2 disabled son's, one will be returning from the nursing home soon. ) Available Help at Discharge: Available 24 hours/day;Family Type of Home: Mobile home Home Access: Ramped entrance     Home Layout: One level Home Equipment: Walker - 2 wheels;Bedside commode;Shower seat;Wheelchair - manual      Prior Function Level of Independence: Needs assistance   Gait / Transfers Assistance Needed: Pt states she has not been walking at home since her last admission, and she is mostly using her w/c.  Last  admission was ~2weeks ago, and at that point she was able to ambulate from her bed>bathroom with RW (~34ft)  ADL's / Homemaking Assistance Needed: Pt has been requireing increased assistance for dressing and bathing, and receives [facility] services including PT.   Comments: Pt states she uses a RW in the house, and usually uses the w/c in the community.  Her son will drive to run errands.      Hand Dominance        Extremity/Trunk Assessment   Upper Extremity Assessment: Generalized weakness           Lower Extremity Assessment: Generalized weakness         Communication   Communication: No difficulties  Cognition Arousal/Alertness: Awake/alert Behavior During Therapy: WFL for tasks assessed/performed Overall Cognitive Status: Within Functional Limits for tasks assessed                      General Comments      Exercises        Assessment/Plan    PT Assessment Patient needs continued PT services  PT Diagnosis Difficulty walking;Generalized weakness   PT Problem List Decreased strength;Decreased activity tolerance;Decreased balance;Decreased mobility;Decreased knowledge of use of DME;Decreased safety awareness;Decreased knowledge of precautions;Cardiopulmonary status limiting activity  PT Treatment Interventions Gait training;Functional mobility training;Therapeutic activities;Therapeutic exercise;Balance training;Patient/family education;DME instruction;Wheelchair mobility training   PT Goals (Current goals can be found in the Care Plan section) Acute Rehab PT Goals Patient Stated Goal: Pt expressed that she wants to get home and stronger.  PT Goal Formulation: With patient Time For Goal Achievement: 08/11/15 Potential to Achieve Goals: Fair    Frequency Min 5X/week   Barriers to discharge        Co-evaluation               End of Session Equipment Utilized During Treatment: Gait belt Activity Tolerance: Patient limited by fatigue Patient left:  in chair;with call bell/phone within reach      Functional Assessment Tool Used: clinical Judgement.  Functional Limitation: Mobility: Walking and moving around Mobility: Walking and Moving Around Current Status [phone]): At least 60 percent but less than 80 percent impaired, limited or restricted Mobility: Walking and Moving Around Goal Status [phone]): At least 40 percent but less than 60 percent impaired, limited or restricted    Time: 1041-1110 PT Time Calculation (min) (ACUTE ONLY): 29 min   Charges:   PT Evaluation $PT Eval Moderate Complexity: 1 Procedure PT Treatments $Therapeutic Activity: 8-22 mins   PT G Codes:   PT G-Codes **NOT FOR INPATIENT CLASS** Functional Assessment Tool Used: clinical Judgement.  Functional Limitation: Mobility: Walking and moving around Mobility: Walking and Moving Around Current Status [phone]): At least 60 percent but less  than 80 percent impaired, limited or restricted Mobility: Walking and Moving Around Goal Status [phone]): At least 40 percent but less than 60 percent impaired, limited or restricted    Tacy Learn, PT, DPT X: [id number]  07/28/2015, 12:56 PM

## 2015-07-28 NOTE — Care Management Note (Signed)
Case Management Note  Patient Details  Name: [name] MRN: [id number] Date of Birth: May 28, 1945   Expected Discharge Date:  07/27/15               Expected Discharge Plan:  [place]  In-House Referral:  NA  Discharge planning Services  CM Consult  Post Acute Care Choice:  Home Health, Resumption of Svcs/PTA Provider Choice offered to:  Patient  DME Arranged:    DME Agency:     HH Arranged:    [place] Agency:     Status of Service:  Completed, signed off  Medicare Important Message Given:  Yes Date Medicare IM Given:    Medicare IM give by:    Date Additional Medicare IM Given:    Additional Medicare Important Message give by:     If discussed at [place] of Stay Meetings, dates discussed:    Additional Comments: Pt returning home with resumption of [facility] services through [facility]. Towanda Malkin, of [facility], made aware of DC today and will obtain pt info from chart. Pt understands HH has 48 hours to resume services. Pt given voucher for 30-free supply of Eliquis with discharge info.   Sherald Barge, RN 07/28/2015, 1:35 PM

## 2015-07-28 NOTE — Care Management Important Message (Signed)
Important Message  Patient Details  Name: [name] MRN: [id number] Date of Birth: [birth year]   Medicare Important Message Given:  Yes    Sherald Barge, RN 07/28/2015, 1:35 PM

## 2015-07-28 NOTE — Discharge Summary (Signed)
Physician Discharge Summary  [name] [id number] DOB: April 07, 1945 DOA: 07/24/2015  PCP: Robert Bellow, MD  Admit date: 07/24/2015 Discharge date: 07/28/2015  Time spent: 45 minutes  Recommendations for Outpatient Follow-up:  -Will be discharged home today. -Advised to follow-up with primary care provider in 2 weeks. -Would recommend repeat chest x-ray in 4-6 weeks to ensure complete resolution of pneumonia. -Has been started on Eliquis for treatment of DVT/PE. -Needs repeat chest CT in 6 months to evaluate a solitary pulmonary nodule.   Discharge Diagnoses:  Active Problems:   Acute respiratory failure with hypoxia (HCC)   Pacemaker   SSS (sick sinus syndrome) (HCC)   COPD with acute exacerbation (HCC)   Thrombocytopenia (HCC)   Diabetes mellitus with complication (HCC)   PAF (paroxysmal atrial fibrillation) ([place])   Acute pulmonary embolism ([place])   DVT (deep venous thrombosis) ([place])   Discharge Condition: Stable and improved  Filed Weights   07/24/15 1540  Weight: 66.679 kg (147 lb)    History of present illness:  As per Dr. Barbaraann Faster on 4/21:  [name] is a 70 y.o. female with medical history significant of OSA, PAF. PVD s/p bilat LE stent placement, GERD, DM, SSS, HLD, Tobacco use, pacemaker, COPD O2 Dependency. NEw Hypoxemia. Patient on O2 at home. Despite baseline 2 L nasal cannula patient noted to be hypoxic in the 80s. This was relieved by increasing O2 2-3 L. This is concerning the home health care staff who referred patient here to the emergency room. Patient states that ever since being discharged from the hospital for treatment for CAP and COPD exacerbation. She is progressively improved from a respiratory standpoint. Of note she is admitted for left lower lobe 3 acquired pneumonia. Patient completed her steroid taper and antibiotics a few days ago. Patient denies any fevers, productive cough, largely swelling, chest pain, sensation of shortness of  breath, abdominal pain, dysuria, frequency, back pain, rash, diarrhea, palpitations, neck stiffness, headache  Hospital Course:   Acute on chronic hypoxemic respiratory failure -CT scan shows evidence for acute pulmonary embolism, also atelectasis with possibly mucus plugging also possibly pneumonia.  -Seen by speech therapy with recommendations for a dysphagia 3 diet with thin liquids -Continue oxygen supplementation as needed.  Acute pulmonary embolism and DVT -Continue eliquis.  Probable aspiration pneumonia -We'll discharge him on a course of clindamycin.  Solitary pulmonary nodule -We'll need repeat CT chest in 6 months.  COPD -Does not appear exacerbated at this point.   Procedures:  None   Consultations:  None  Discharge Instructions  Discharge Instructions    Diet - low sodium heart healthy    Complete by:  As directed      Increase activity slowly    Complete by:  As directed             Medication List    STOP taking these medications        levofloxacin 750 MG tablet  Commonly known as:  LEVAQUIN     predniSONE 10 MG tablet  Commonly known as:  DELTASONE      TAKE these medications        albuterol (2.5 MG/3ML) 0.083% nebulizer solution  Commonly known as:  PROVENTIL  Take 2.5 mg by nebulization every 6 (six) hours as needed for wheezing or shortness of breath.     apixaban 5 MG Tabs tablet  Commonly known as:  ELIQUIS  Take 2 tablets (10 mg total) by mouth 2 (two) times  daily.     apixaban 5 MG Tabs tablet  Commonly known as:  ELIQUIS  Take 1 tablet (5 mg total) by mouth 2 (two) times daily.  Start taking on:  08/02/2015     aspirin 81 MG tablet  Take 1 tablet (81 mg total) by mouth daily.     CENTRUM SILVER ULTRA WOMENS Tabs  Take 1 tablet by mouth daily.     clindamycin 300 MG capsule  Commonly known as:  CLEOCIN  Take 1 capsule (300 mg total) by mouth 3 (three) times daily.     guaiFENesin 600 MG 12 hr tablet  Commonly known  as:  MUCINEX  Take 1 tablet (600 mg total) by mouth 2 (two) times daily.     oxyCODONE-acetaminophen 10-325 MG tablet  Commonly known as:  PERCOCET  Take 1 tablet by mouth 4 (four) times daily as needed for pain.     OXYGEN  Inhale 2 L into the lungs continuous.     senna-docusate 8.6-50 MG tablet  Commonly known as:  Senokot-S  Take 2 tablets by mouth daily as needed for mild constipation.     solifenacin 5 MG tablet  Commonly known as:  VESICARE  Take 5 mg by mouth daily.       Allergies  Allergen Reactions  . Prednisone Other (See Comments)    Chest pain       Follow-up Information    Follow up with [facility].   Specialty:  Home Health Services   Contact information:   [address] [place] [address] [phone]       Follow up with Robert Bellow, MD. Schedule an appointment as soon as possible for a visit in 2 weeks.   Specialty:  Family Medicine   Contact information:   [place] [id number] [phone]        The results of significant diagnostics from this hospitalization (including imaging, microbiology, ancillary and laboratory) are listed below for reference.    Significant Diagnostic Studies: Ct Angio Chest Pe W/cm &/or Wo Cm  07/25/2015  CLINICAL DATA:  Right lower extremity deep venous thrombosis diagnosed this morning. Clinical concern for pulmonary embolism. EXAM: CT ANGIOGRAPHY CHEST WITH CONTRAST TECHNIQUE: Multidetector CT imaging of the chest was performed using the standard protocol during bolus administration of intravenous contrast. Multiplanar CT image reconstructions and MIPs were obtained to evaluate the vascular anatomy. CONTRAST:  143mL OMNIPAQUE IOHEXOL 350 MG/ML SOLN COMPARISON:  08/02/2013 unenhanced chest CT and Chest radiograph from one day prior. FINDINGS: Mediastinum/Nodes: The study is high quality for the evaluation of pulmonary embolism. There is a curvilinear and slightly  eccentric thrombus within the lobar pulmonary artery branch of the right lower lobe. No additional pulmonary emboli. Atherosclerotic nonaneurysmal thoracic aorta. Top-normal caliber main pulmonary artery (2.9 cm diameter). Mild cardiomegaly with dilated right ventricle and right atrium, not appreciably changed from prior chest CT. No pericardial fluid/thickening. Left anterior descending, left circumflex and right coronary atherosclerosis. Two lead left subclavian pacemaker with lead tips in the right atrium and right ventricular apex. Stable coarsely calcified 1.2 cm left thyroid lobe nodule. Fluid level in the mid thoracic esophagus. No pathologically enlarged axillary, mediastinal or hilar lymph nodes. Lungs/Pleura: No pneumothorax. No pleural effusion. Secretions nearly occlude the bilateral lower lobe bronchi. Complete right middle lobe and left lower lobe consolidation and near complete right lower lobe consolidation with significant volume loss in these lobes. Peripheral right upper lobe 10 x  8 mm sub solid pulmonary nodule (series 5/image 20), which may have increased from 6 x 5 mm on [id number] (uncertain if this represents the same nodule). Mosaic attenuation in the aerated portions of the lungs. Upper abdomen: Unremarkable. Musculoskeletal: No aggressive appearing focal osseous lesions. Mild degenerative changes in the thoracic spine. Review of the MIP images confirms the above findings. IMPRESSION: 1. Solitary lobar pulmonary embolus in the right lower lobe, which appears subacute. 2. Stable mild cardiomegaly with chronic dilatation of the right heart chambers. 3. Complete right middle lobe and left lower lobe consolidation and near complete right lower lobe consolidation with associated significant volume loss in these lobes, favor atelectasis, cannot exclude a component of aspiration or pneumonia given the near complete occlusion of the associated bronchi by secretions/mucous plugging. 4. Fluid in the  thoracic esophagus, suggesting esophageal dysmotility and/or gastroesophageal reflux. 5. Right upper lobe 10 mm subsolid pulmonary nodule. Initial follow-up with CT at 6-12 months is recommended to confirm persistence. If persistent, repeat CT is recommended every 2 years until 5 years of stability has been established. This recommendation follows the consensus statement: Guidelines for Management of Incidental Pulmonary Nodules Detected on CT Images:From the Fleischner Society 2017; published online before print (10.1148/radiol.SG:5268862). 6. Three-vessel coronary atherosclerosis. Critical Value/emergent results were called by telephone at the time of interpretation on 07/25/2015 at 3:05 pm to Dr. Lelon Frohlich , who verbally acknowledged these results. Electronically Signed   By: Ilona Sorrel M.D.   On: 07/25/2015 15:06   Nm Pulmonary Perf And Vent  07/09/2015  CLINICAL DATA:  Shortness of breath increased over past few weeks, chronic pneumonia, elevated D-dimer, history diabetes mellitus, smoking, paroxysmal atrial fibrillation, COPD EXAM: NUCLEAR MEDICINE VENTILATION - PERFUSION LUNG SCAN TECHNIQUE: Ventilation images were obtained in multiple projections using inhaled aerosol Tc-44m DTPA. Perfusion images were obtained in multiple projections after intravenous injection of Tc-81m MAA. RADIOPHARMACEUTICALS:  30 mCi Technetium-48m DTPA aerosol inhalation and 3.1 mCi Technetium-86m MAA IV COMPARISON:  None Correlation:  Chest radiograph 07/09/2015 FINDINGS: Ventilation: Central airway and oropharyngeal deposition of tracer. Swallowed aerosol within stomach. Enlargement of cardiac silhouette. No other ventilatory abnormalities. Perfusion: Photopenic defect LEFT apex due to pacemaker generator. Moderate perfusion defect medial basal segment RIGHT lower lobe best appreciated on RPO view, with normal ventilation at this site. No other focal perfusion defects. Chest radiograph demonstrates LEFT lower lobe  atelectasis. Moderate-sized perfusion defect medial basal segment RIGHT lower lobe with normal ventilation representing an intermediate probability for pulmonary embolism. Electronically Signed   By: Lavonia Dana M.D.   On: 07/09/2015 12:40   US Venous Img Lower Bilateral  07/25/2015  CLINICAL DATA:  Hypoxemia.  History of PE. EXAM: BILATERAL LOWER EXTREMITY VENOUS DOPPLER ULTRASOUND TECHNIQUE: Gray-scale sonography with graded compression, as well as color Doppler and duplex ultrasound were performed to evaluate the lower extremity deep venous systems from the level of the common femoral vein and including the common femoral, femoral, profunda femoral, popliteal and calf veins including the posterior tibial, peroneal and gastrocnemius veins when visible. The superficial great saphenous vein was also interrogated. Spectral Doppler was utilized to evaluate flow at rest and with distal augmentation maneuvers in the common femoral, femoral and popliteal veins. COMPARISON:  Bilateral venous ultrasound, 07/09/2015 FINDINGS: RIGHT LOWER EXTREMITY Common Femoral Vein: No evidence of thrombus. Normal compressibility, respiratory phasicity and response to augmentation. Saphenofemoral Junction: No evidence of thrombus. Normal compressibility and flow on color Doppler imaging. Profunda Femoral Vein: No evidence of thrombus. Normal compressibility  and flow on color Doppler imaging. Femoral Vein: No evidence of thrombus. Normal compressibility, respiratory phasicity and response to augmentation. Popliteal Vein: There is noncompressible acute thrombus in the popliteal vein call partially occlusive. Calf Veins: Veins not visualized.  Additional thrombus suspected. Superficial Great Saphenous Vein: No evidence of thrombus. Normal compressibility and flow on color Doppler imaging. Venous Reflux:  None. Other Findings:  None. LEFT LOWER EXTREMITY Common Femoral Vein: No evidence of thrombus. Normal compressibility, respiratory  phasicity and response to augmentation. Saphenofemoral Junction: No evidence of thrombus. Normal compressibility and flow on color Doppler imaging. Profunda Femoral Vein: No evidence of thrombus. Normal compressibility and flow on color Doppler imaging. Femoral Vein: No evidence of thrombus. Normal compressibility, respiratory phasicity and response to augmentation. Popliteal Vein: No evidence of thrombus. Normal compressibility, respiratory phasicity and response to augmentation. Calf Veins: Not visualized. Superficial Great Saphenous Vein: No evidence of thrombus. Normal compressibility and flow on color Doppler imaging. Venous Reflux:  None. Other Findings:  None. IMPRESSION: 1. Acute deep venous thrombosis in the right popliteal vein with possible additional thrombus into the calf veins, which were not visualized. This has developed since the prior lower extremity venous ultrasound. 2. No evidence of a left lower extremity deep venous thrombosis. Electronically Signed   By: Lajean Manes M.D.   On: 07/25/2015 10:06   US Venous Img Lower Bilateral  07/09/2015  CLINICAL DATA:  PE.  Short of breath.  Elevated D-dimer. EXAM: BILATERAL LOWER EXTREMITY VENOUS DUPLEX ULTRASOUND TECHNIQUE: Doppler venous assessment of the bilateral lower extremity deep venous system was performed, including characterization of spectral flow, compressibility, and phasicity. COMPARISON:  None. FINDINGS: There is complete compressibility of the bilateral common femoral, femoral, and popliteal vein. Doppler analysis demonstrates respiratory phasicity and augmentation of flow. IMPRESSION: No evidence of lower extremity DVT. Electronically Signed   By: Marybelle Killings M.D.   On: 07/09/2015 16:30   Dg Chest Port 1 View  07/24/2015  CLINICAL DATA:  70 year old female with a history of shortness of breath EXAM: PORTABLE CHEST 1 VIEW COMPARISON:  07/11/2015, 07/09/2015 FINDINGS: Cardiomediastinal silhouette unchanged, with cardiomegaly.  Unchanged position of left cardiac pacing device with 2 leads in place. Left basilar opacity obscuring the retrocardiac region an the left hemidiaphragm. No pneumothorax. Nonspecific opacity at the medial right base. Low lung volumes. IMPRESSION: Low lung volumes with ill-defined opacity in the retrocardiac region and at the right base. This may reflect atelectasis, although if there is concern for developing infection, a formal PA and lateral chest x-ray may be useful. Unchanged left chest wall cardiac pacing device. Signed, Dulcy Fanny. Earleen Newport, DO [facility] Electronically Signed   By: Corrie Mckusick D.O.   On: 07/24/2015 16:31   Dg Chest Port 1 View  07/11/2015  CLINICAL DATA:  Followup left lower lobe pneumonia. EXAM: PORTABLE CHEST 1 VIEW COMPARISON:  07/09/2015 and earlier, including CT chest 08/02/2013. FINDINGS: Cardiac silhouette markedly enlarged. Left subclavian dual lead transvenous pacemaker unchanged. Mild pulmonary venous hypertension, increased since the examination 2 days ago, without overt edema currently. Consolidation in the left lower lobe, with slight improvement in aeration. Stable small left pleural effusion. Mild right basilar atelectasis, slightly increased since examination 2 days ago. IMPRESSION: 1. Left lower lobe pneumonia with slight improved aeration since the examination 2 days ago. 2. Stable small left parapneumonic effusion. 3. New mild right basilar atelectasis. 4. Stable cardiomegaly. Pulmonary venous hypertension, slightly increased since the examination 2 days ago, without overt  edema currently. Electronically Signed   By: Evangeline Dakin M.D.   On: 07/11/2015 10:36   Dg Chest Port 1 View  07/09/2015  CLINICAL DATA:  Productive cough.  Worsening fatigue.  Dyspnea. EXAM: PORTABLE CHEST 1 VIEW COMPARISON:  08/25/2014 FINDINGS: There is consolidation in the left base, and a small left effusion. This may represent  pneumonia. The right lung is clear. There is moderate unchanged cardiomegaly. There are intact appearances of the dual-lumen transvenous cardiac leads. Pulmonary vasculature is normal. IMPRESSION: Left base consolidation and effusion. Probable pneumonia. Recommend follow-up radiography after treatment, 3 6 weeks, to confirm clearance and exclude neoplasm. Electronically Signed   By: Andreas Newport M.D.   On: 07/09/2015 01:02    Microbiology: Recent Results (from the past 240 hour(s))  Urine culture     Status: None   Collection Time: 07/24/15  8:20 PM  Result Value Ref Range Status   Specimen Description URINE, CLEAN CATCH  Final   Special Requests NONE  Final   Culture MULTIPLE SPECIES PRESENT, SUGGEST RECOLLECTION  Final   Report Status 07/27/2015 FINAL  Final     Labs: Basic Metabolic Panel:  Recent Labs Lab 07/24/15 1611 07/25/15 0650 07/26/15 0613 07/27/15 0456  NA 142 137 138 140  K 5.1 5.2* 4.7 5.1  CL 108 109 107 104  CO2 28 22 25 30   GLUCOSE 119* 254* 93 81  BUN 33* 30* 22* 20  CREATININE 1.10* 1.04* 0.80 0.84  CALCIUM 10.1 9.4 9.5 9.7   Liver Function Tests: No results for input(s): AST, ALT, ALKPHOS, BILITOT, PROT, ALBUMIN in the last 168 hours. No results for input(s): LIPASE, AMYLASE in the last 168 hours. No results for input(s): AMMONIA in the last 168 hours. CBC:  Recent Labs Lab 07/24/15 1611 07/25/15 0650 07/26/15 0613 07/28/15 0546  WBC 9.0 7.0 8.9 8.5  NEUTROABS 4.9  --   --   --   HGB 13.8 12.0 12.0 12.3  HCT 46.6* 39.8 39.6 43.4  MCV 85.7 84.9 84.3 85.6  PLT 94* 99* 105* 129*   Cardiac Enzymes:  Recent Labs Lab 07/24/15 1611  TROPONINI 0.03   BNP: BNP (last 3 results)  Recent Labs  08/25/14 1710 07/09/15 0040  BNP 886.0* 636.0*    ProBNP (last 3 results) No results for input(s): PROBNP in the last 8760 hours.  CBG:  Recent Labs Lab 07/27/15 0715 07/27/15 1129 07/27/15 1646 07/27/15 2007 07/28/15 1117  GLUCAP 67  99 84 122* 114*       Signed:  [facility] Pager: [phone] 07/28/2015, 2:36 PM

## 2015-07-28 NOTE — Care Management Important Message (Signed)
Important Message  Patient Details  Name: [name] MRN: [id number] Date of Birth: 12/27/1945   Medicare Important Message Given:  Yes    Sherald Barge, RN 07/28/2015, 1:35 PM

## 2015-07-28 NOTE — Progress Notes (Signed)
Discharged to home with family.

## 2015-07-29 ENCOUNTER — Ambulatory Visit: Payer: Medicare Other | Admitting: Urology

## 2015-07-31 ENCOUNTER — Encounter (INDEPENDENT_AMBULATORY_CARE_PROVIDER_SITE_OTHER): Payer: Self-pay | Admitting: *Deleted

## 2015-08-18 ENCOUNTER — Encounter (HOSPITAL_COMMUNITY): Payer: Self-pay

## 2015-08-18 ENCOUNTER — Inpatient Hospital Stay (HOSPITAL_COMMUNITY)
Admission: EM | Admit: 2015-08-18 | Discharge: 2015-08-20 | DRG: 190 | Disposition: A | Discharge status: Home Health | Payer: Medicare Other | Attending: Internal Medicine | Admitting: Internal Medicine

## 2015-08-18 ENCOUNTER — Emergency Department (HOSPITAL_COMMUNITY): Payer: Medicare Other

## 2015-08-18 DIAGNOSIS — E1165 Type 2 diabetes mellitus with hyperglycemia: Secondary | ICD-10-CM | POA: Diagnosis present

## 2015-08-18 DIAGNOSIS — F17210 Nicotine dependence, cigarettes, uncomplicated: Secondary | ICD-10-CM | POA: Diagnosis present

## 2015-08-18 DIAGNOSIS — N179 Acute kidney failure, unspecified: Secondary | ICD-10-CM | POA: Diagnosis present

## 2015-08-18 DIAGNOSIS — J189 Pneumonia, unspecified organism: Secondary | ICD-10-CM | POA: Diagnosis present

## 2015-08-18 DIAGNOSIS — Y95 Nosocomial condition: Secondary | ICD-10-CM | POA: Diagnosis present

## 2015-08-18 DIAGNOSIS — J440 Chronic obstructive pulmonary disease with acute lower respiratory infection: Secondary | ICD-10-CM | POA: Diagnosis not present

## 2015-08-18 DIAGNOSIS — G894 Chronic pain syndrome: Secondary | ICD-10-CM | POA: Diagnosis present

## 2015-08-18 DIAGNOSIS — I480 Paroxysmal atrial fibrillation: Secondary | ICD-10-CM | POA: Diagnosis present

## 2015-08-18 DIAGNOSIS — R0600 Dyspnea, unspecified: Secondary | ICD-10-CM | POA: Diagnosis not present

## 2015-08-18 DIAGNOSIS — R0902 Hypoxemia: Secondary | ICD-10-CM

## 2015-08-18 DIAGNOSIS — K219 Gastro-esophageal reflux disease without esophagitis: Secondary | ICD-10-CM | POA: Diagnosis present

## 2015-08-18 DIAGNOSIS — I2699 Other pulmonary embolism without acute cor pulmonale: Secondary | ICD-10-CM

## 2015-08-18 DIAGNOSIS — J441 Chronic obstructive pulmonary disease with (acute) exacerbation: Secondary | ICD-10-CM | POA: Diagnosis not present

## 2015-08-18 DIAGNOSIS — G4733 Obstructive sleep apnea (adult) (pediatric): Secondary | ICD-10-CM | POA: Diagnosis present

## 2015-08-18 DIAGNOSIS — Z950 Presence of cardiac pacemaker: Secondary | ICD-10-CM

## 2015-08-18 DIAGNOSIS — E440 Moderate protein-calorie malnutrition: Secondary | ICD-10-CM | POA: Diagnosis present

## 2015-08-18 DIAGNOSIS — Z7901 Long term (current) use of anticoagulants: Secondary | ICD-10-CM

## 2015-08-18 DIAGNOSIS — J9621 Acute and chronic respiratory failure with hypoxia: Secondary | ICD-10-CM | POA: Diagnosis present

## 2015-08-18 DIAGNOSIS — R739 Hyperglycemia, unspecified: Secondary | ICD-10-CM | POA: Insufficient documentation

## 2015-08-18 DIAGNOSIS — Z79891 Long term (current) use of opiate analgesic: Secondary | ICD-10-CM

## 2015-08-18 DIAGNOSIS — J9622 Acute and chronic respiratory failure with hypercapnia: Secondary | ICD-10-CM | POA: Diagnosis present

## 2015-08-18 DIAGNOSIS — E119 Type 2 diabetes mellitus without complications: Secondary | ICD-10-CM

## 2015-08-18 DIAGNOSIS — E875 Hyperkalemia: Secondary | ICD-10-CM | POA: Diagnosis present

## 2015-08-18 DIAGNOSIS — M5136 Other intervertebral disc degeneration, lumbar region: Secondary | ICD-10-CM | POA: Diagnosis present

## 2015-08-18 DIAGNOSIS — Z86711 Personal history of pulmonary embolism: Secondary | ICD-10-CM

## 2015-08-18 DIAGNOSIS — Z86718 Personal history of other venous thrombosis and embolism: Secondary | ICD-10-CM

## 2015-08-18 DIAGNOSIS — I739 Peripheral vascular disease, unspecified: Secondary | ICD-10-CM | POA: Diagnosis present

## 2015-08-18 DIAGNOSIS — J449 Chronic obstructive pulmonary disease, unspecified: Secondary | ICD-10-CM | POA: Diagnosis present

## 2015-08-18 DIAGNOSIS — Z9981 Dependence on supplemental oxygen: Secondary | ICD-10-CM

## 2015-08-18 LAB — BLOOD GAS, ARTERIAL
Acid-base deficit: 2.1 mmol/L — ABNORMAL HIGH (ref 0.0–2.0)
Bicarbonate: 21.9 mEq/L (ref 20.0–24.0)
O2 Content: 3 L/min
O2 Saturation: 87.8 %
PCO2 ART: 48.5 mmHg — AB (ref 35.0–45.0)
PH ART: 7.303 — AB (ref 7.350–7.450)
Patient temperature: 37
pO2, Arterial: 62.2 mmHg — ABNORMAL LOW (ref 80.0–100.0)

## 2015-08-18 LAB — CBC
HEMATOCRIT: 40.7 % (ref 36.0–46.0)
HEMOGLOBIN: 12.3 g/dL (ref 12.0–15.0)
MCH: 27.8 pg (ref 26.0–34.0)
MCHC: 30.2 g/dL (ref 30.0–36.0)
MCV: 92.1 fL (ref 78.0–100.0)
PLATELETS: 155 10*3/uL (ref 150–400)
RBC: 4.42 MIL/uL (ref 3.87–5.11)
RDW: 22.8 % — ABNORMAL HIGH (ref 11.5–15.5)
WBC: 9.1 10*3/uL (ref 4.0–10.5)

## 2015-08-18 LAB — LACTIC ACID, PLASMA
LACTIC ACID, VENOUS: 2.8 mmol/L — AB (ref 0.5–2.0)
Lactic Acid, Venous: 2 mmol/L (ref 0.5–2.0)

## 2015-08-18 LAB — BASIC METABOLIC PANEL
ANION GAP: 5 (ref 5–15)
BUN: 33 mg/dL — ABNORMAL HIGH (ref 6–20)
CHLORIDE: 113 mmol/L — AB (ref 101–111)
CO2: 27 mmol/L (ref 22–32)
Calcium: 9.5 mg/dL (ref 8.9–10.3)
Creatinine, Ser: 1.27 mg/dL — ABNORMAL HIGH (ref 0.44–1.00)
GFR calc non Af Amer: 42 mL/min — ABNORMAL LOW (ref >60)
GFR, EST AFRICAN AMERICAN: 48 mL/min — AB (ref >60)
Glucose, Bld: 199 mg/dL — ABNORMAL HIGH (ref 65–99)
POTASSIUM: 4.3 mmol/L (ref 3.5–5.1)
SODIUM: 145 mmol/L (ref 135–145)

## 2015-08-18 LAB — GLUCOSE, CAPILLARY: GLUCOSE-CAPILLARY: 244 mg/dL — AB (ref 65–99)

## 2015-08-18 LAB — PROCALCITONIN

## 2015-08-18 LAB — TROPONIN I: Troponin I: 0.04 ng/mL — ABNORMAL HIGH (ref <0.031)

## 2015-08-18 LAB — I-STAT CG4 LACTIC ACID, ED: LACTIC ACID, VENOUS: 2.66 mmol/L — AB (ref 0.5–2.0)

## 2015-08-18 LAB — CBG MONITORING, ED: Glucose-Capillary: 202 mg/dL — ABNORMAL HIGH (ref 65–99)

## 2015-08-18 MED ORDER — OXYCODONE-ACETAMINOPHEN 5-325 MG PO TABS
1.0000 | ORAL_TABLET | Freq: Four times a day (QID) | ORAL | Status: DC | PRN
  Administered 2015-08-18 – 2015-08-20 (×5): 1 via ORAL
  Filled 2015-08-18 (×5): qty 1

## 2015-08-18 MED ORDER — PHENYLEPH-SHARK LIV OIL-MO-PET 0.25-3-14-71.9 % RE OINT
1.0000 "application " | TOPICAL_OINTMENT | RECTAL | Status: DC | PRN
  Filled 2015-08-18: qty 28.4

## 2015-08-18 MED ORDER — GUAIFENESIN ER 600 MG PO TB12
600.0000 mg | ORAL_TABLET | Freq: Two times a day (BID) | ORAL | Status: DC
  Administered 2015-08-18 – 2015-08-20 (×4): 600 mg via ORAL
  Filled 2015-08-18 (×4): qty 1

## 2015-08-18 MED ORDER — SENNOSIDES-DOCUSATE SODIUM 8.6-50 MG PO TABS: 2.0000 | ORAL_TABLET | Freq: Every day | ORAL | Status: DC | PRN

## 2015-08-18 MED ORDER — DEXTROSE 5 % IV SOLN
1.0000 g | Freq: Three times a day (TID) | INTRAVENOUS | Status: DC
  Filled 2015-08-18 (×3): qty 1

## 2015-08-18 MED ORDER — VANCOMYCIN HCL 10 G IV SOLR
1250.0000 mg | Freq: Once | INTRAVENOUS | Status: AC
  Administered 2015-08-18: 1250 mg via INTRAVENOUS
  Filled 2015-08-18: qty 1250

## 2015-08-18 MED ORDER — METHYLPREDNISOLONE SODIUM SUCC 125 MG IJ SOLR
60.0000 mg | Freq: Four times a day (QID) | INTRAMUSCULAR | Status: DC
  Administered 2015-08-18 – 2015-08-20 (×6): 60 mg via INTRAVENOUS
  Filled 2015-08-18 (×6): qty 2

## 2015-08-18 MED ORDER — VANCOMYCIN HCL IN DEXTROSE 1-5 GM/200ML-% IV SOLN: 1000.0000 mg | INTRAVENOUS | Status: DC

## 2015-08-18 MED ORDER — SODIUM CHLORIDE 0.9 % IV SOLN
INTRAVENOUS | Status: DC
  Administered 2015-08-18: 18:00:00 via INTRAVENOUS

## 2015-08-18 MED ORDER — PREDNISONE 50 MG PO TABS
60.0000 mg | ORAL_TABLET | Freq: Once | ORAL | Status: AC
  Administered 2015-08-18: 60 mg via ORAL
  Filled 2015-08-18: qty 1

## 2015-08-18 MED ORDER — OXYCODONE-ACETAMINOPHEN 10-325 MG PO TABS: 1.0000 | ORAL_TABLET | Freq: Four times a day (QID) | ORAL | Status: DC | PRN

## 2015-08-18 MED ORDER — PIPERACILLIN-TAZOBACTAM 3.375 G IVPB
3.3750 g | Freq: Three times a day (TID) | INTRAVENOUS | Status: DC
  Administered 2015-08-18: 3.375 g via INTRAVENOUS
  Filled 2015-08-18: qty 50

## 2015-08-18 MED ORDER — ALBUTEROL SULFATE (2.5 MG/3ML) 0.083% IN NEBU: 5.0000 mg | INHALATION_SOLUTION | Freq: Once | RESPIRATORY_TRACT | Status: DC

## 2015-08-18 MED ORDER — IPRATROPIUM-ALBUTEROL 0.5-2.5 (3) MG/3ML IN SOLN
RESPIRATORY_TRACT | Status: AC
  Administered 2015-08-18: 3 mL via RESPIRATORY_TRACT
  Filled 2015-08-18: qty 3

## 2015-08-18 MED ORDER — OXYCODONE-ACETAMINOPHEN 5-325 MG PO TABS
1.0000 | ORAL_TABLET | Freq: Once | ORAL | Status: AC
  Administered 2015-08-18: 1 via ORAL
  Filled 2015-08-18: qty 1

## 2015-08-18 MED ORDER — CEFAZOLIN SODIUM 1-5 GM-% IV SOLN
INTRAVENOUS | Status: AC
  Filled 2015-08-18: qty 50

## 2015-08-18 MED ORDER — OXYCODONE HCL 5 MG PO TABS
5.0000 mg | ORAL_TABLET | Freq: Four times a day (QID) | ORAL | Status: DC | PRN
  Administered 2015-08-18 – 2015-08-20 (×4): 5 mg via ORAL
  Filled 2015-08-18 (×4): qty 1

## 2015-08-18 MED ORDER — IPRATROPIUM-ALBUTEROL 0.5-2.5 (3) MG/3ML IN SOLN
3.0000 mL | Freq: Once | RESPIRATORY_TRACT | Status: AC
  Administered 2015-08-18: 3 mL via RESPIRATORY_TRACT

## 2015-08-18 MED ORDER — INSULIN ASPART 100 UNIT/ML ~~LOC~~ SOLN
0.0000 [IU] | Freq: Three times a day (TID) | SUBCUTANEOUS | Status: DC
  Administered 2015-08-19: 5 [IU] via SUBCUTANEOUS
  Administered 2015-08-19: 1 [IU] via SUBCUTANEOUS
  Administered 2015-08-19 – 2015-08-20 (×2): 2 [IU] via SUBCUTANEOUS
  Administered 2015-08-20: 5 [IU] via SUBCUTANEOUS

## 2015-08-18 MED ORDER — SODIUM CHLORIDE 0.9 % IV BOLUS (SEPSIS)
500.0000 mL | Freq: Once | INTRAVENOUS | Status: AC
  Administered 2015-08-18: 500 mL via INTRAVENOUS

## 2015-08-18 MED ORDER — IPRATROPIUM BROMIDE 0.02 % IN SOLN: 0.5000 mg | Freq: Once | RESPIRATORY_TRACT | Status: DC

## 2015-08-18 MED ORDER — SODIUM CHLORIDE 0.9 % IV SOLN
INTRAVENOUS | Status: AC
  Administered 2015-08-18: 22:00:00 via INTRAVENOUS

## 2015-08-18 MED ORDER — APIXABAN 5 MG PO TABS
5.0000 mg | ORAL_TABLET | Freq: Two times a day (BID) | ORAL | Status: DC
  Administered 2015-08-18 – 2015-08-20 (×4): 5 mg via ORAL
  Filled 2015-08-18 (×4): qty 1

## 2015-08-18 MED ORDER — IPRATROPIUM-ALBUTEROL 0.5-2.5 (3) MG/3ML IN SOLN: 3.0000 mL | RESPIRATORY_TRACT | Status: DC | PRN

## 2015-08-18 MED ORDER — ALBUTEROL SULFATE (2.5 MG/3ML) 0.083% IN NEBU
INHALATION_SOLUTION | RESPIRATORY_TRACT | Status: AC
  Administered 2015-08-18: 2.5 mg via RESPIRATORY_TRACT
  Filled 2015-08-18: qty 3

## 2015-08-18 MED ORDER — ALBUTEROL SULFATE (2.5 MG/3ML) 0.083% IN NEBU
2.5000 mg | INHALATION_SOLUTION | Freq: Once | RESPIRATORY_TRACT | Status: AC
  Administered 2015-08-18: 2.5 mg via RESPIRATORY_TRACT

## 2015-08-18 MED ORDER — DEXTROSE 5 % IV SOLN
2.0000 g | INTRAVENOUS | Status: DC
  Administered 2015-08-18: 2 g via INTRAVENOUS
  Filled 2015-08-18: qty 2

## 2015-08-18 NOTE — Progress Notes (Signed)
Patient arrived to unit. VSS. Patient is in no distress/pain. Order/chart reviewed. RN will continue to monitor.   

## 2015-08-18 NOTE — ED Notes (Signed)
EDP at bedside. Pt sleepier than upon initial assessment, arouses with tactile stimuli. ABG ordered and RT notified. nad noted.

## 2015-08-18 NOTE — ED Notes (Signed)
Complain of being SOB and back pain

## 2015-08-18 NOTE — ED Notes (Signed)
Pt was given one albuterol and one duo neb by EMS prior to arrival

## 2015-08-18 NOTE — H&P (Signed)
History and Physical    [name] [id number] DOB: 07-13-1945 DOA: 08/18/2015  PCP: Robert Bellow, MD   Patient coming from: Home   Chief Complaint: Dyspnea  HPI: [name] is a 70 y.o. female with medical history significant for COPD with supplemental O2 requirement at baseline, OSA on CPAP, chronic low back pain on narcotic, sick sinus syndrome with pacer, and recent diagnosis of PE and DVT on Eliquis presents to the ED with progressive dyspnea despite her use of nebulized breathing treatments and supplemental oxygen at home. Patient was admitted to the hospital from 07/24/2015 to 07/28/2015 with dyspnea and was found to have pulmonary embolism and DVT. She ended up being discharged home in improved and stable condition on request. She reports making a full return to her baseline respiratory status prior to the insidious onset of her current symptoms. She denies any fevers, chills, chest pain, or palpitations. She denies any lower extremity edema or orthopnea. She has been using her home nebulizer unit with increasing frequency but her condition continues to worsen, marked by dyspnea on minimal exertion. EMS was activated, found patient to be saturating in 80s on 2 L/m nasal canula, and gave a neb treatment en route.   ED Course: Upon arrival to the ED, patient is found to be afebrile, saturating 92% on 3 L/m supplemental oxygen, and with vital signs otherwise stable. EKG features a paced rhythm. Chest x-ray is notable for opacity in the lingula and left base which could represent atelectasis or an infectious infiltrate. Chemistry panel is notable for BUN of 33 and serum creatinine 1.27, up from 0.841 month prior. CBC is normal, lactate is elevated to 2.66, and troponin is mildly elevated to 0.04. Blood cultures were obtained, duo nebs and prednisone given, and the patient was treated empirically with vancomycin and Zosyn. She was given a dose of oral prednisone 60 mg. Patient  remained hemodynamically stable in the emergency department and will be admitted to the hospital for ongoing evaluation and management of COPD with exacerbation and acute on chronic respiratory failure, possibly secondary to HCAP.   Review of Systems:  All other systems reviewed and apart from HPI, are negative.  Past Medical History  Diagnosis Date  . Sleep apnea     uses cpap  . Peripheral vascular disease ([place])   . GERD (gastroesophageal reflux disease)   . Neuromuscular disorder ([place])     HX of MD  . Diabetes ([place])   . Sinus node dysfunction ([facility])   . H/O cardiac pacemaker   . Tobacco abuse   . Hyperlipidemia   . SSS (sick sinus syndrome) ([place])   . DDD (degenerative disc disease), lumbar   . On home O2     2L N/C  . COPD (chronic obstructive pulmonary disease) ([place])     Past Surgical History  Procedure Laterality Date  . Insert / replace / remove pacemaker    . Cholecystectomy    . Abdominal hysterectomy    . Tonsillectomy    . Tubal ligation    . Femoral artery stent  02/18/2011  . Esophagogastroduodenoscopy  04/13/2002    A short segment of salmon-colored epithelium in the distal esophagus  consistent with Barrett's esophagus, biopsied/ The remainder of the esophageal mucosa, stomach, and duodenum through the second portion appeared normal  . Colonoscopy  04/13/2002    Diminutive polyps in the rectum cold biopsied/removed/ The remainder of the rectal mucosa and colon appeared normal.  . Lower extremity angiogram N/A  02/18/2011    Procedure: LOWER EXTREMITY ANGIOGRAM;  Surgeon: Lorretta Harp, MD;  Location: [facility] CATH LAB;  Service: Cardiovascular;  Laterality: N/A;  . Lower extremity angiogram N/A 10/29/2012    Procedure: LOWER EXTREMITY ANGIOGRAM;  Surgeon: Lorretta Harp, MD;  Location: [facility] CATH LAB;  Service: Cardiovascular;  Laterality: N/A;  . Abdominal angiogram  10/29/2012    Procedure: ABDOMINAL ANGIOGRAM;  Surgeon: Lorretta Harp, MD;  Location: [facility] CATH LAB;   Service: Cardiovascular;;  . Implantable cardioverter defibrillator generator change Left 10/29/2013    Procedure: IMPLANTABLE CARDIOVERTER DEFIBRILLATOR GENERATOR CHANGE;  Surgeon: Sanda Klein, MD;  Location: [facility] CATH LAB;  Service: Cardiovascular;  Laterality: Left;  . Transthoracic echocardiogram  10/05/2011    EF 50-55% Pacemaker present,   . Transthoracic echocardiogram  05/20/2010    EF=>55%, pacer enduced LBBB, conduction abnormality  . Nm myocar perf wall motion  10/05/2011    Protocol:Lexiscan, mild perfusion defect due to pacing induced artifact  . Nm myocar perf wall motion  05/20/2010    Protocol:Persantine, EF78%, normal perfusion in all regions,   . Colonoscopy with propofol N/A 01/23/2015    Procedure: COLONOSCOPY WITH PROPOFOL;  Surgeon: Rogene Houston, MD;  Location: [facility] ORS;  Service: Endoscopy;  Laterality: N/A;  [place] time in 0814  time out  0837  total time 23 minutes  . Esophagogastroduodenoscopy (egd) with propofol N/A 01/23/2015    Procedure: ESOPHAGOGASTRODUODENOSCOPY (EGD) WITH PROPOFOL;  Surgeon: Rogene Houston, MD;  Location: [facility] ORS;  Service: Endoscopy;  Laterality: N/A;  procedure 1  . Esophageal dilation N/A 01/23/2015    Procedure: ESOPHAGEAL DILATION;  Surgeon: Rogene Houston, MD;  Location: [facility]S;  Service: Endoscopy;  Laterality: N/A;  [place] 31  . Biopsy N/A 01/23/2015    Procedure: BIOPSY;  Surgeon: Rogene Houston, MD;  Location: [facility]S;  Service: Endoscopy;  Laterality: N/A;  duodenal and esophageal  . Polypectomy N/A 01/23/2015    Procedure: POLYPECTOMY;  Surgeon: Rogene Houston, MD;  Location: [facility] ORS;  Service: Endoscopy;  Laterality: N/A;  cecal, hepatic flexure, distal transverse colon     reports that she has been smoking Cigarettes.  She has a 50 pack-year smoking history. She has never used smokeless tobacco. She reports that she does not drink alcohol or use illicit drugs.  Allergies  Allergen Reactions  . Prednisone Other (See Comments)     Chest pain    Family History  Problem Relation Age of Onset  . Cancer Mother   . Cancer Father      Prior to Admission medications   Medication Sig Start Date End Date Taking? Authorizing Provider  albuterol (PROVENTIL) (2.5 MG/3ML) 0.083% nebulizer solution Take 2.5 mg by nebulization every 6 (six) hours as needed for wheezing or shortness of breath.    Yes Historical Provider, MD  apixaban (ELIQUIS) 5 MG TABS tablet Take 1 tablet (5 mg total) by mouth 2 (two) times daily. 08/02/15  Yes Estela Leonie Green, MD  guaiFENesin (MUCINEX) 600 MG 12 hr tablet Take 1 tablet (600 mg total) by mouth 2 (two) times daily. 04/15/13  Yes Nimish Luther Parody, MD  hydrocortisone cream 1 % Apply 1 application topically 3 (three) times daily.   Yes Historical Provider, MD  ibuprofen (ADVIL,MOTRIN) 200 MG tablet Take 400 mg by mouth every 6 (six) hours as needed for headache.   Yes Historical Provider, MD  Multiple Vitamins-Minerals (CENTRUM SILVER ULTRA WOMENS) TABS Take 1 tablet by mouth daily.  Yes Historical Provider, MD  oxyCODONE-acetaminophen (PERCOCET) 10-325 MG per tablet Take 1 tablet by mouth 4 (four) times daily as needed for pain.    Yes Historical Provider, MD  OXYGEN Inhale 2 L into the lungs continuous.   Yes Historical Provider, MD  phenylephrine-shark liver oil-mineral oil-petrolatum (PREPARATION H) 0.25-3-14-71.9 % rectal ointment Place 1 application rectally as needed for hemorrhoids.   Yes Historical Provider, MD  senna-docusate (SENOKOT-S) 8.6-50 MG tablet Take 2 tablets by mouth daily as needed for mild constipation.    Yes Historical Provider, MD  apixaban (ELIQUIS) 5 MG TABS tablet Take 2 tablets (10 mg total) by mouth 2 (two) times daily. 07/28/15   Erline Hau, MD  aspirin 81 MG tablet Take 1 tablet (81 mg total) by mouth daily. Patient not taking: Reported on 08/18/2015 03/03/15   Dani Gobble Croitoru, MD  clindamycin (CLEOCIN) 300 MG capsule Take 1 capsule (300 mg  total) by mouth 3 (three) times daily. Patient not taking: Reported on 08/18/2015 07/28/15   Erline Hau, MD  solifenacin (VESICARE) 5 MG tablet Take 5 mg by mouth daily. Reported on 08/18/2015    Historical Provider, MD    Physical Exam: Filed Vitals:   08/18/15 1630 08/18/15 1700 08/18/15 1730 08/18/15 1819  BP: 105/55 100/47 92/65 118/62  Pulse:  70 70 69  Temp:    98.2 F (36.8 C)  TempSrc:    Oral  Resp: 15 15 17    Height:    5\' 1"  (1.549 m)  Weight:    66.225 kg (146 lb)  SpO2: 95% 96% 94% 97%      Constitutional: NAD, calm, comfortable, resting Eyes: PERTLA, lids and conjunctivae normal ENMT: Mucous membranes are moist. Posterior pharynx clear of any exudate or lesions.   Neck: normal, supple, no masses, no thyromegaly Respiratory: Diminished bilateraly, no wheezing, no crackles. Normal respiratory effort. No accessory muscle use.  Cardiovascular: S1 & S2 heard, regular rate and rhythm, no significant murmurs / rubs / gallops. No extremity edema. Abdomen: No distension, no tenderness, no masses palpated. Bowel sounds normal.  Musculoskeletal: no clubbing / cyanosis. No joint deformity upper and lower extremities. Normal muscle tone.  Skin: no significant rashes, lesions, ulcers. Warm, dry, well-perfused. Neurologic: CN 2-12 grossly intact. Sensation intact, DTR normal. Strength 5/5 in all 4 limbs.  Psychiatric: Normal judgment and insight. Alert and oriented x 3. Normal mood and affect.     Labs on Admission: I have personally reviewed following labs and imaging studies  CBC:  Recent Labs Lab 08/18/15 1256  WBC 9.1  HGB 12.3  HCT 40.7  MCV 92.1  PLT 99991111   Basic Metabolic Panel:  Recent Labs Lab 08/18/15 1256  NA 145  K 4.3  CL 113*  CO2 27  GLUCOSE 199*  BUN 33*  CREATININE 1.27*  CALCIUM 9.5   GFR: Estimated Creatinine Clearance: 35.9 mL/min (by C-G formula based on Cr of 1.27). Liver Function Tests: No results for input(s): AST,  ALT, ALKPHOS, BILITOT, PROT, ALBUMIN in the last 168 hours. No results for input(s): LIPASE, AMYLASE in the last 168 hours. No results for input(s): AMMONIA in the last 168 hours. Coagulation Profile: No results for input(s): INR, PROTIME in the last 168 hours. Cardiac Enzymes:  Recent Labs Lab 08/18/15 1256  TROPONINI 0.04*   BNP (last 3 results) No results for input(s): PROBNP in the last 8760 hours. HbA1C: No results for input(s): HGBA1C in the last 72 hours. CBG:  Recent Labs  Lab 08/18/15 1209  GLUCAP 202*   Lipid Profile: No results for input(s): CHOL, HDL, LDLCALC, TRIG, CHOLHDL, LDLDIRECT in the last 72 hours. Thyroid Function Tests: No results for input(s): TSH, T4TOTAL, FREET4, T3FREE, THYROIDAB in the last 72 hours. Anemia Panel: No results for input(s): VITAMINB12, FOLATE, FERRITIN, TIBC, IRON, RETICCTPCT in the last 72 hours. Urine analysis:    Component Value Date/Time   COLORURINE YELLOW 07/24/2015 2020   APPEARANCEUR CLEAR 07/24/2015 2020   LABSPEC 1.020 07/24/2015 2020   PHURINE 5.5 07/24/2015 2020   GLUCOSEU NEGATIVE 07/24/2015 2020   HGBUR NEGATIVE 07/24/2015 2020   BILIRUBINUR NEGATIVE 07/24/2015 2020   KETONESUR NEGATIVE 07/24/2015 2020   PROTEINUR NEGATIVE 07/24/2015 2020   UROBILINOGEN 0.2 08/25/2014 1919   NITRITE NEGATIVE 07/24/2015 2020   LEUKOCYTESUR NEGATIVE 07/24/2015 2020   Sepsis Labs: @LABRCNTIP (procalcitonin:4,lacticidven:4) )No results found for this or any previous visit (from the past 240 hour(s)).   Radiological Exams on Admission: Dg Chest Portable 1 View  08/18/2015  CLINICAL DATA:  Shortness of breath starting this morning EXAM: PORTABLE CHEST 1 VIEW COMPARISON:  07/25/2015 FINDINGS: Borderline cardiomegaly. 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 There is atelectasis or infiltrate in lingula and left base. No pulmonary edema. IMPRESSION: Atelectasis or infiltrate in lingula and left base. No pulmonary edema. Dual lead cardiac  pacemaker in place. Electronically Signed   By: Lahoma Crocker M.D.   On: 08/18/2015 12:47    EKG: Independently reviewed. A-V dual-paced rhythm.   Assessment/Plan  1. COPD with acute exacerbation, acute on chronic hypoxic respiratory failure   - Worsening SOB and increased cough, sputum production, increased O2-requirement  - DuoNebs q4h prn, Solu-Medrol 60 mg IV q6h  - Monitor O2 saturation and titrate FiO2 to maintain >92%   2. ?HCAP  - CXR with opacities in ligula and left base; suspect this is atelectasis rather than PNA given lack of fever, white count, or purulent sputum - Vancomycin and Zosyn given in ED after blood cultures procured  - Check sputum culture; check urine for antigens to strep pneumo and legionella  - Continue broad-spectrums for now, but if PCT low and pt continues to be afebrile, will discontinue    3. AKI   - SCr 1.27 on arrival, up from 0.84 last month  - Suspect a prerenal azotemia in setting of poor oral intake with soft BPs  - Hydrate with NS overnight  - Avoid nephrotoxins where possible  - Renally-dose medications as appropriate  - Repeat chem panel in am and consider extending workup if failing to improve    4. PE, DVT   - Diagnosed during recent admission and started on Eliquis  - Continue Eliquis   - If acute on chronic respiratory failure fails to resolve with treatments outlined above, may require re-evaluation PE burden   5. Chronic pain  - Stable, localized to low back  - Continue home-dose oxycodone prn    6. Hyperglycemia  - Serum glucose elevated to 199 on admission  - Previously treated for diabetes, but no longer taking medications for this - A1c was 6.3% last month, suggesting some insulin resistance  - Could be exacerbated by acute illness; expect to worsen with systemic steroids for COPD exacerbation  - Check CBG with meals and qHS - Low-intensity SSI prn    DVT prophylaxis: Eliquis  Code Status: Full  Family Communication:  Discussed with patient  Disposition Plan: Observe on telemetry   Consults called: None   Admission status: Observation  Vianne Bulls, MD [facility] Pager [phone]  If 7PM-7AM, please contact night-coverage [email or web address] Password [facility]  08/18/2015, 6:59 PM

## 2015-08-18 NOTE — ED Provider Notes (Signed)
CSN: [id number]     Arrival date & time 08/18/15  1205 History  By signing my name below, I, Emmanuella Mensah, attest that this documentation has been prepared under the direction and in the presence of Daleen Bo, MD. Electronically Signed: Judithann Sauger, ED Scribe. 08/18/2015. 12:36 PM.    Chief Complaint  Patient presents with  . Shortness of Breath   Patient is a 70 y.o. female presenting with shortness of breath. The history is provided by the patient. No language interpreter was used.  Shortness of Breath Severity:  Moderate Onset quality:  Sudden Timing:  Constant Progression:  Unchanged Chronicity:  New Relieved by:  None tried Worsened by:  Nothing tried Ineffective treatments:  None tried Associated symptoms: cough   Associated symptoms: no chest pain, no fever and no vomiting   Risk factors: hx of PE/DVT    HPI Comments: JERILYN SCIRE is a 70 y.o. female with a hx of DM and COPD who presents to the Emergency Department complaining of an ongoing episode of trouble speaking and tremors onset 8 am. She reports associated SOB and non-productive cough. She states that she is not currently back to her baseline. No alleviating factors noted. Pt has not tried any medications PTA. Pt is right hand dominate. She denies any similar SOB episodes but states that last time she had SOB, she had a PE and a DVT. Pt is currently on Eliquis, 2L of home O2, C-PAP, and nebulizer. She adds that she has not used her C-PAP for a while. Pt states that she was on antibiotics and prednisone one month ago for pneumonia. She states that she is smokes once in a while. Per chart, pt has an allergy to Prednisone but pt states that she did not have CP the last time she had prednisone. She denies any fever, chills, trouble eating/swallowing, trouble walking, or CP.   Past Medical History  Diagnosis Date  . Sleep apnea     uses cpap  . Peripheral vascular disease ([place])   . GERD (gastroesophageal reflux  disease)   . Neuromuscular disorder ([place])     HX of MD  . Diabetes ([place])   . Sinus node dysfunction ([facility])   . H/O cardiac pacemaker   . Tobacco abuse   . Hyperlipidemia   . SSS (sick sinus syndrome) ([place])   . DDD (degenerative disc disease), lumbar   . On home O2     2L N/C  . COPD (chronic obstructive pulmonary disease) ([place])    Past Surgical History  Procedure Laterality Date  . Insert / replace / remove pacemaker    . Cholecystectomy    . Abdominal hysterectomy    . Tonsillectomy    . Tubal ligation    . Femoral artery stent  02/18/2011  . Esophagogastroduodenoscopy  04/13/2002    A short segment of salmon-colored epithelium in the distal esophagus  consistent with Barrett's esophagus, biopsied/ The remainder of the esophageal mucosa, stomach, and duodenum through the second portion appeared normal  . Colonoscopy  04/13/2002    Diminutive polyps in the rectum cold biopsied/removed/ The remainder of the rectal mucosa and colon appeared normal.  . Lower extremity angiogram N/A 02/18/2011    Procedure: LOWER EXTREMITY ANGIOGRAM;  Surgeon: Lorretta Harp, MD;  Location: [facility] CATH LAB;  Service: Cardiovascular;  Laterality: N/A;  . Lower extremity angiogram N/A 10/29/2012    Procedure: LOWER EXTREMITY ANGIOGRAM;  Surgeon: Lorretta Harp, MD;  Location: [facility] CATH LAB;  Service:  Cardiovascular;  Laterality: N/A;  . Abdominal angiogram  10/29/2012    Procedure: ABDOMINAL ANGIOGRAM;  Surgeon: Lorretta Harp, MD;  Location: [facility] CATH LAB;  Service: Cardiovascular;;  . Implantable cardioverter defibrillator generator change Left 10/29/2013    Procedure: IMPLANTABLE CARDIOVERTER DEFIBRILLATOR GENERATOR CHANGE;  Surgeon: Sanda Klein, MD;  Location: [facility] CATH LAB;  Service: Cardiovascular;  Laterality: Left;  . Transthoracic echocardiogram  10/05/2011    EF 50-55% Pacemaker present,   . Transthoracic echocardiogram  05/20/2010    EF=>55%, pacer enduced LBBB, conduction abnormality  . Nm  myocar perf wall motion  10/05/2011    Protocol:Lexiscan, mild perfusion defect due to pacing induced artifact  . Nm myocar perf wall motion  05/20/2010    Protocol:Persantine, EF78%, normal perfusion in all regions,   . Colonoscopy with propofol N/A 01/23/2015    Procedure: COLONOSCOPY WITH PROPOFOL;  Surgeon: Rogene Houston, MD;  Location: [facility] ORS;  Service: Endoscopy;  Laterality: N/A;  [place] time in 0814  time out  0837  total time 23 minutes  . Esophagogastroduodenoscopy (egd) with propofol N/A 01/23/2015    Procedure: ESOPHAGOGASTRODUODENOSCOPY (EGD) WITH PROPOFOL;  Surgeon: Rogene Houston, MD;  Location: [facility] ORS;  Service: Endoscopy;  Laterality: N/A;  procedure 1  . Esophageal dilation N/A 01/23/2015    Procedure: ESOPHAGEAL DILATION;  Surgeon: Rogene Houston, MD;  Location: [facility] ORS;  Service: Endoscopy;  Laterality: N/A;  [place] 65  . Biopsy N/A 01/23/2015    Procedure: BIOPSY;  Surgeon: Rogene Houston, MD;  Location: [facility] ORS;  Service: Endoscopy;  Laterality: N/A;  duodenal and esophageal  . Polypectomy N/A 01/23/2015    Procedure: POLYPECTOMY;  Surgeon: Rogene Houston, MD;  Location: [facility] ORS;  Service: Endoscopy;  Laterality: N/A;  cecal, hepatic flexure, distal transverse colon   Family History  Problem Relation Age of Onset  . Cancer Mother   . Cancer Father    Social History  Substance Use Topics  . Smoking status: Current Every Day Smoker -- 1.00 packs/day for 50 years    Types: Cigarettes    Last Attempt to Quit: 02/02/2013  . Smokeless tobacco: Never Used     Comment: "LIGHT THEM AND LET THEM BURN UP"  . Alcohol Use: No   OB History    No data available     Review of Systems  Constitutional: Negative for fever.  Respiratory: Positive for cough and shortness of breath.   Cardiovascular: Negative for chest pain.  Gastrointestinal: Negative for vomiting.  All other systems reviewed and are negative.     Allergies  Prednisone  Home Medications   Prior to  Admission medications   Medication Sig Start Date End Date Taking? Authorizing Provider  albuterol (PROVENTIL) (2.5 MG/3ML) 0.083% nebulizer solution Take 2.5 mg by nebulization every 6 (six) hours as needed for wheezing or shortness of breath.    Yes Historical Provider, MD  apixaban (ELIQUIS) 5 MG TABS tablet Take 1 tablet (5 mg total) by mouth 2 (two) times daily. 08/02/15  Yes Estela Leonie Green, MD  guaiFENesin (MUCINEX) 600 MG 12 hr tablet Take 1 tablet (600 mg total) by mouth 2 (two) times daily. 04/15/13  Yes Nimish Luther Parody, MD  hydrocortisone cream 1 % Apply 1 application topically 3 (three) times daily.   Yes Historical Provider, MD  ibuprofen (ADVIL,MOTRIN) 200 MG tablet Take 400 mg by mouth every 6 (six) hours as needed for headache.   Yes Historical Provider, MD  Multiple Vitamins-Minerals (CENTRUM  SILVER ULTRA WOMENS) TABS Take 1 tablet by mouth daily.    Yes Historical Provider, MD  oxyCODONE-acetaminophen (PERCOCET) 10-325 MG per tablet Take 1 tablet by mouth 4 (four) times daily as needed for pain.    Yes Historical Provider, MD  OXYGEN Inhale 2 L into the lungs continuous.   Yes Historical Provider, MD  phenylephrine-shark liver oil-mineral oil-petrolatum (PREPARATION H) 0.25-3-14-71.9 % rectal ointment Place 1 application rectally as needed for hemorrhoids.   Yes Historical Provider, MD  senna-docusate (SENOKOT-S) 8.6-50 MG tablet Take 2 tablets by mouth daily as needed for mild constipation.    Yes Historical Provider, MD  apixaban (ELIQUIS) 5 MG TABS tablet Take 2 tablets (10 mg total) by mouth 2 (two) times daily. 07/28/15   Erline Hau, MD  aspirin 81 MG tablet Take 1 tablet (81 mg total) by mouth daily. Patient not taking: Reported on 08/18/2015 03/03/15   Dani Gobble Croitoru, MD  clindamycin (CLEOCIN) 300 MG capsule Take 1 capsule (300 mg total) by mouth 3 (three) times daily. Patient not taking: Reported on 08/18/2015 07/28/15   Erline Hau, MD   solifenacin (VESICARE) 5 MG tablet Take 5 mg by mouth daily. Reported on 08/18/2015    Historical Provider, MD   BP 102/64 mmHg  Pulse 74  Temp(Src) 98.1 F (36.7 C) (Rectal)  Resp 16  Ht 5\' 1"  (1.549 m)  Wt 145 lb (65.772 kg)  BMI 27.41 kg/m2  SpO2 92% Physical Exam  Constitutional: She is oriented to person, place, and time. She appears well-developed and well-nourished.  HENT:  Head: Normocephalic and atraumatic.  Cardiovascular: Normal rate.   Pulmonary/Chest: Effort normal.  Decreased air movement bilaterally with scattered rhonchi, no wheezes. No increased work of breathing  Neurological: She is alert and oriented to person, place, and time.  Skin: Skin is warm and dry.  Psychiatric: She has a normal mood and affect.  Nursing note and vitals reviewed.   ED Course  Procedures (including critical care time) DIAGNOSTIC STUDIES: Oxygen Saturation is 92% on RA, adequate by my interpretation.    COORDINATION OF CARE: 12:26 PM- Pt advised of plan for treatment and pt agrees. Pt states that she had relief from the albuterol.   Initial clinical impression- likely COPD exacerbation. Patient has an increased O2 requirement, and may need more aggressive workup. Initial testing with blood work, and chest x-ray ordered. Chest x-ray returned showing possible infiltrate. Therefore, she will be evaluated for pneumonia. As recently hospitalized. Therefore, may have a healthcare associated pneumonia. Empiric antibodies ordered, and will screen for sepsis.Additional testing ordered with lactate, and blood cultures. Fluid bolus given, 30 cc/kg. Clinical screening for sepsis, normal pulses, capillary refill, and blood pressure.  Medications  piperacillin-tazobactam (ZOSYN) IVPB 3.375 g (0 g Intravenous Stopped 08/18/15 1458)  vancomycin (VANCOCIN) IVPB 1000 mg/200 mL premix (not administered)  predniSONE (DELTASONE) tablet 60 mg (60 mg Oral Given 08/18/15 1244)  oxyCODONE-acetaminophen  (PERCOCET/ROXICET) 5-325 MG per tablet 1 tablet (1 tablet Oral Given 08/18/15 1245)  ipratropium-albuterol (DUONEB) 0.5-2.5 (3) MG/3ML nebulizer solution 3 mL (3 mLs Nebulization Given 08/18/15 1311)  albuterol (PROVENTIL) (2.5 MG/3ML) 0.083% nebulizer solution 2.5 mg (2.5 mg Nebulization Given 08/18/15 1310)  vancomycin (VANCOCIN) 1,250 mg in sodium chloride 0.9 % 250 mL IVPB (1,250 mg Intravenous New Bag/Given 08/18/15 1458)    Patient Vitals for the past 24 hrs:  BP Temp Temp src Pulse Resp SpO2 Height Weight  08/18/15 1521 102/64 mmHg - - 74 16 92 % - -  08/18/15 1434 115/92 mmHg - - 70 20 91 % - -  08/18/15 1429 - 98.1 F (36.7 C) Rectal - - - - -  08/18/15 1400 (!) 117/31 mmHg - - 70 20 93 % - -  08/18/15 1311 - - - - - 90 % - -  08/18/15 1300 119/58 mmHg - - 69 18 94 % - -  08/18/15 1210 126/72 mmHg 98.4 F (36.9 C) Oral 70 20 92 % 5\' 1"  (1.549 m) 145 lb (65.772 kg)  08/18/15 1206 - - - - - - 5' 1.5" (1.562 m) 145 lb (65.772 kg)   1405- patient alert but somnolent, arousable easily with voice. He indicates infiltrate. Additional testing and treatment ordered to evaluate for severe sepsis. SIRS criteria are negative. However, with debilitated state, IV fluid bolus, high-volume ordered.   4:36 PM Reevaluation with update and discussion. After initial assessment and treatment, an updated evaluation reveals patient is sitting up, awake, alert, and states that she feels more comfortable. Vital signs reviewed and are reassuring. Oxygenation on 4 L nasal cannula supplementation is 94%. The patient denies shakiness, trouble speaking or shortness of breath at this time.  Findings discussed with patient, she agrees to admission for observation and further treatment. Mayes Sangiovanni L   4:47 PM-Consult complete with Hospitalist. Patient case explained and discussed. Agrees to admit patient for further evaluation and treatment. Call ended at 17:10    CRITICAL CARE Performed by: Richarda Blade Total critical care time: 50 minutes Critical care time was exclusive of separately billable procedures and treating other patients. Critical care was necessary to treat or prevent imminent or life-threatening deterioration. Critical care was time spent personally by me on the following activities: development of treatment plan with patient and/or surrogate as well as nursing, discussions with consultants, evaluation of patient's response to treatment, examination of patient, obtaining history from patient or surrogate, ordering and performing treatments and interventions, ordering and review of laboratory studies, ordering and review of radiographic studies, pulse oximetry and re-evaluation of patient's condition.    Labs Review Labs Reviewed  BASIC METABOLIC PANEL - Abnormal; Notable for the following:    Chloride 113 (*)    Glucose, Bld 199 (*)    BUN 33 (*)    Creatinine, Ser 1.27 (*)    GFR calc non Af Amer 42 (*)    GFR calc Af Amer 48 (*)    All other components within normal limits  CBC - Abnormal; Notable for the following:    RDW 22.8 (*)    All other components within normal limits  TROPONIN I - Abnormal; Notable for the following:    Troponin I 0.04 (*)    All other components within normal limits  BLOOD GAS, ARTERIAL - Abnormal; Notable for the following:    pH, Arterial 7.303 (*)    pCO2 arterial 48.5 (*)    pO2, Arterial 62.2 (*)    Acid-base deficit 2.1 (*)    All other components within normal limits  CBG MONITORING, ED - Abnormal; Notable for the following:    Glucose-Capillary 202 (*)    All other components within normal limits  I-STAT CG4 LACTIC ACID, ED - Abnormal; Notable for the following:    Lactic Acid, Venous 2.66 (*)    All other components within normal limits  CULTURE, BLOOD (ROUTINE X 2)  CULTURE, BLOOD (ROUTINE X 2)    Imaging Review Dg Chest Portable 1 View  08/18/2015  CLINICAL DATA:  Shortness of breath  starting this morning EXAM: PORTABLE  CHEST 1 VIEW COMPARISON:  07/25/2015 FINDINGS: Borderline cardiomegaly. 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 There is atelectasis or infiltrate in lingula and left base. No pulmonary edema. IMPRESSION: Atelectasis or infiltrate in lingula and left base. No pulmonary edema. 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 Electronically Signed   By: Lahoma Crocker M.D.   On: 08/18/2015 12:47     Daleen Bo, MD has personally reviewed and evaluated these images and lab results as part of his medical decision-making.   EKG Interpretation   Date/Time:  Tuesday Aug 18 2015 12:09:01 EDT Ventricular Rate:  70 PR Interval:  190 QRS Duration: 170 QT Interval:  448 QTC Calculation: 483 R Axis:   -83 Text Interpretation:  A-V dual-paced rhythm with some inhibition No  further analysis attempted due to paced rhythm since last tracing no  significant change Confirmed by [facility]  MD, Vira Agar [id number]) on 08/18/2015  12:52:14 PM      MDM   Final diagnoses:  HCAP (healthcare-associated pneumonia)  Chronic obstructive pulmonary disease with acute lower respiratory infection ([place])  Hypoxia  AKI (acute kidney injury) ([place])    Shortness of breath with increased oxygen requirement, abnormal chest x-ray, and COPD. She does have mild hypercapnia, but appears at baseline with that. Oxygenation improved with increasing nasal cannula supplementation. Elevated lactate initially, preceding IV fluid resuscitation. Treatment for age With possible sepsis. Initiated with high-volume IV bolus, and broad-spectrum antibiotic. Patient was hospitalized less than one month ago with COPD exacerbation. This requires treatment for HCAP. She had a period of lethargy, in the emergency department, and is mildly dehydrated. She improved with IV fluid treatment. She will require admission for further monitoring and treatment. Prednisone was initiated, for likely COPD exacerbation. Doubt impending vascular collapse. Repeat lactate, recommended at  1720, for trending. Patient's initial complaint of shakiness, and trouble talking, were more likely related to shortness of breath, not a CNS abnormality. Her symptoms improved with the treatment, which was given.   Nursing Notes Reviewed/ Care Coordinated, and agree without changes. Applicable Imaging Reviewed.  Interpretation of Laboratory Data incorporated into ED treatment  Plan: Admit  I personally performed the services described in this documentation, which was scribed in my presence. The recorded information has been reviewed and is accurate.      Daleen Bo, MD 08/18/15 2256

## 2015-08-18 NOTE — Progress Notes (Signed)
Critical lab: lactic acid 2.8, called in by Finis Bud at 9:15pm. Md notified.

## 2015-08-18 NOTE — Progress Notes (Signed)
Received report from [facility] regarding patient coming to rm [id number]

## 2015-08-18 NOTE — Progress Notes (Addendum)
Pharmacy Antibiotic Note  [name] is a 70 y.o. female admitted on 08/18/2015 with pneumonia.  Pharmacy has been consulted for vancomycin and zosyn dosing.  Plan: Zosyn 3.375 gm IV q8 hours Vancomycin 1250 mg IV X 1 then 1gm IV q24 hours F/u renal function, cultures and clinical course  Height: 5\' 1"  (154.9 cm) Weight: 145 lb (65.772 kg) IBW/kg (Calculated) : 47.8  Temp (24hrs), Avg:98.4 F (36.9 C), Min:98.4 F (36.9 C), Max:98.4 F (36.9 C)   Recent Labs Lab 08/18/15 1256  WBC 9.1  CREATININE 1.27*    Estimated Creatinine Clearance: 35.8 mL/min (by C-G formula based on Cr of 1.27).    Allergies  Allergen Reactions  . Prednisone Other (See Comments)    Chest pain    Antimicrobials this admission: vanc 5/16 >>  zosyn 5/16 >> 5/16 Cefepime 5/16>>   Thank you for allowing pharmacy to be a part of this patient's care.  Excell Seltzer Poteet 08/18/2015 2:10 PM Zosyn switched to Cefepime.  Adjusted for renal function. Pricilla Larsson, [facility] 08/18/2015 7:43 PM

## 2015-08-19 DIAGNOSIS — I739 Peripheral vascular disease, unspecified: Secondary | ICD-10-CM | POA: Diagnosis present

## 2015-08-19 DIAGNOSIS — Z950 Presence of cardiac pacemaker: Secondary | ICD-10-CM | POA: Diagnosis not present

## 2015-08-19 DIAGNOSIS — Z86718 Personal history of other venous thrombosis and embolism: Secondary | ICD-10-CM | POA: Diagnosis not present

## 2015-08-19 DIAGNOSIS — K219 Gastro-esophageal reflux disease without esophagitis: Secondary | ICD-10-CM | POA: Diagnosis present

## 2015-08-19 DIAGNOSIS — Z7901 Long term (current) use of anticoagulants: Secondary | ICD-10-CM | POA: Diagnosis not present

## 2015-08-19 DIAGNOSIS — J9621 Acute and chronic respiratory failure with hypoxia: Secondary | ICD-10-CM | POA: Diagnosis not present

## 2015-08-19 DIAGNOSIS — E440 Moderate protein-calorie malnutrition: Secondary | ICD-10-CM | POA: Diagnosis present

## 2015-08-19 DIAGNOSIS — G4733 Obstructive sleep apnea (adult) (pediatric): Secondary | ICD-10-CM | POA: Diagnosis present

## 2015-08-19 DIAGNOSIS — Z86711 Personal history of pulmonary embolism: Secondary | ICD-10-CM | POA: Diagnosis not present

## 2015-08-19 DIAGNOSIS — E1165 Type 2 diabetes mellitus with hyperglycemia: Secondary | ICD-10-CM | POA: Diagnosis present

## 2015-08-19 DIAGNOSIS — Z79891 Long term (current) use of opiate analgesic: Secondary | ICD-10-CM | POA: Diagnosis not present

## 2015-08-19 DIAGNOSIS — E875 Hyperkalemia: Secondary | ICD-10-CM | POA: Diagnosis present

## 2015-08-19 DIAGNOSIS — G894 Chronic pain syndrome: Secondary | ICD-10-CM | POA: Diagnosis present

## 2015-08-19 DIAGNOSIS — M5136 Other intervertebral disc degeneration, lumbar region: Secondary | ICD-10-CM | POA: Diagnosis present

## 2015-08-19 DIAGNOSIS — I480 Paroxysmal atrial fibrillation: Secondary | ICD-10-CM | POA: Diagnosis present

## 2015-08-19 DIAGNOSIS — F17210 Nicotine dependence, cigarettes, uncomplicated: Secondary | ICD-10-CM | POA: Diagnosis present

## 2015-08-19 DIAGNOSIS — N179 Acute kidney failure, unspecified: Secondary | ICD-10-CM | POA: Diagnosis present

## 2015-08-19 DIAGNOSIS — J9622 Acute and chronic respiratory failure with hypercapnia: Secondary | ICD-10-CM | POA: Diagnosis not present

## 2015-08-19 DIAGNOSIS — J189 Pneumonia, unspecified organism: Secondary | ICD-10-CM | POA: Diagnosis present

## 2015-08-19 DIAGNOSIS — J440 Chronic obstructive pulmonary disease with acute lower respiratory infection: Secondary | ICD-10-CM | POA: Diagnosis present

## 2015-08-19 DIAGNOSIS — Z9981 Dependence on supplemental oxygen: Secondary | ICD-10-CM | POA: Diagnosis not present

## 2015-08-19 DIAGNOSIS — J441 Chronic obstructive pulmonary disease with (acute) exacerbation: Secondary | ICD-10-CM | POA: Diagnosis present

## 2015-08-19 DIAGNOSIS — Y95 Nosocomial condition: Secondary | ICD-10-CM | POA: Diagnosis present

## 2015-08-19 DIAGNOSIS — R0600 Dyspnea, unspecified: Secondary | ICD-10-CM | POA: Diagnosis present

## 2015-08-19 LAB — GLUCOSE, CAPILLARY
Glucose-Capillary: 184 mg/dL — ABNORMAL HIGH (ref 65–99)
Glucose-Capillary: 254 mg/dL — ABNORMAL HIGH (ref 65–99)
Glucose-Capillary: 143 mg/dL — ABNORMAL HIGH (ref 65–99)

## 2015-08-19 LAB — BASIC METABOLIC PANEL
BUN: 27 mg/dL — ABNORMAL HIGH (ref 6–20)
CHLORIDE: 112 mmol/L — AB (ref 101–111)
CO2: 25 mmol/L (ref 22–32)
Calcium: 8.8 mg/dL — ABNORMAL LOW (ref 8.9–10.3)
Creatinine, Ser: 0.94 mg/dL (ref 0.44–1.00)
GLUCOSE: 185 mg/dL — AB (ref 65–99)
POTASSIUM: 5.7 mmol/L — AB (ref 3.5–5.1)
Sodium: 139 mmol/L (ref 135–145)

## 2015-08-19 MED ORDER — SODIUM POLYSTYRENE SULFONATE 15 GM/60ML PO SUSP
15.0000 g | Freq: Once | ORAL | Status: AC
  Administered 2015-08-19: 15 g via ORAL
  Filled 2015-08-19: qty 60

## 2015-08-19 NOTE — Progress Notes (Signed)
Initial Nutrition Assessment  DOCUMENTATION CODES:  Non-severe (moderate) malnutrition in context of chronic illness   Pt meets criteria for MODERATE MALNUTRITION in the context of Chronic illness as evidenced by Mild-moderate fat/muscle wasting.  INTERVENTION:  Monitor PO intake. Reassess need for interventions as warranted  Noted food requests to increase meal intake.   NUTRITION DIAGNOSIS:  Increased nutrient needs related to chronic illness as evidenced by estimated nutritional requirements for this condition  GOAL:  Patient will meet greater than or equal to 90% of their needs  MONITOR:  PO intake, Labs  REASON FOR ASSESSMENT:  Consult COPD Protocol  ASSESSMENT:  69 y/o female PMHx COPD, OSA, DDD, SSS, Tobacco abuse, DM, PVD who was recently admitted to this hospital 4/21-4/25 for dyspnea, PE and DVT. Presents again for dyspnea and admitted for ongoing eval/management COPD and AoC resp failure potentially related to HCAP.   Pt is very frail and ill appearing. Most of her ADLs are taken care of by hired caregivers. She says 3 meals each day are provided to her by them. They also do the large majority of the cooking and shopping. She has severe back pain that largely prohibits her from physical activity, mild-moderate degree of muscle loss is appreciated in legs, likely due to atrophy.   She doesn't follow any type of diet, rather she just tries to eat balanced meals. She says that she usually has no problems with her sugars and the only reason they are high now is because of the steroids. As such she did not follow a diabetic diet. She takes a Centrum MVI.   Pt reports her UBW as 145 lbs. This apparently has been stable for the last 3 or so years; she lost a large amount of wt at that time because of the loss of her husband.  Pt is observed during lunch. She is a slow eater, but is able to manage and feed herself. She says that her appetite is good. The reason she didn't eat  much of breakfast is because she doesn't like hospital food. She wants coffee every meal.    NFPE: mild-moderate muscle loss in lower body, however likely more related to disuse vs malnutrition. Mild-moderate fat wasting is seen in tricep and orbital stores.   Denies any n/v/c/d. Wants to go home. She appears to be largely at baseline. No new nutritional needs at this time.   Labs reviewed: Lactic Acid: 2.8,   Recent Labs Lab 08/18/15 1256 08/19/15 0439  NA 145 139  K 4.3 5.7*  CL 113* 112*  CO2 27 25  BUN 33* 27*  CREATININE 1.27* 0.94  CALCIUM 9.5 8.8*  GLUCOSE 199* 185*    Diet Order:  Diet regular Room service appropriate?: Yes; Fluid consistency:: Thin  Skin: Dry, ecchymotic arm   Last BM:  5/15  Height:  Ht Readings from Last 1 Encounters:  08/18/15 5\' 1"  (1.549 m)   Weight:  Wt Readings from Last 1 Encounters:  08/18/15 146 lb (66.225 kg)   Wt Readings from Last 10 Encounters:  08/18/15 146 lb (66.225 kg)  07/24/15 147 lb (66.679 kg)  07/09/15 147 lb 11.2 oz (66.996 kg)  03/03/15 160 lb (72.576 kg)  01/20/15 150 lb (68.04 kg)  01/05/15 150 lb (68.04 kg)  09/15/14 147 lb (66.679 kg)  08/27/14 147 lb (66.679 kg)  11/13/13 141 lb 1.6 oz (64.003 kg)  10/29/13 145 lb (65.772 kg)   Ideal Body Weight:  47.73 kg  BMI:  Body mass  index is 27.6 kg/(m^2).  Estimated Nutritional Needs:  Kcal:  1450-1650 (22-25 kcal/kg bw) Protein:  48-58 g (1-1.2 g/kg IBW) Fluid:  1.5-1.7 liters  EDUCATION NEEDS:  No education needs identified at this time  Burtis Junes RD, LDN Clinical Nutrition Pager: [phone] 08/19/2015 12:56 PM

## 2015-08-19 NOTE — Progress Notes (Signed)
PROGRESS NOTE    [name]  [id number] DOB: 07-Aug-1945 DOA: 08/18/2015 PCP: Robert Bellow, MD     Brief Narrative:  70 year old lady admitted on 5/16 with complaints of dyspnea. She does have a history significant for COPD who is chronically dependent on oxygen, septic sleep apnea on C Pap. Also history of pulmonary embolism on  Eliquis for anticoagulation. She is believed to have a COPD with acute exacerbation.    Assessment & Plan:   Principal Problem:   Acute on chronic respiratory failure with hypoxia and hypercapnia (HCC) Active Problems:   Type 2 diabetes mellitus (HCC)   COPD (chronic obstructive pulmonary disease) (HCC)   Chronic anticoagulation   AKI (acute kidney injury) ([place])   COPD with acute exacerbation (HCC)   PAF (paroxysmal atrial fibrillation) (HCC)   HCAP (healthcare-associated pneumonia)   Chronic pain syndrome   OSA (obstructive sleep apnea)   Hypoxia   Hyperglycemia   Malnutrition of moderate degree   COPD with acute exacerbation/acute on chronic hypoxemic respiratory failure -Is much improved today. -Continue steroids, nebs, oxygen as required to keep sats above 90%.  -was initially thought that she could potentially have hospital-acquired pneumonia, however this has been discarded and no further antibiotics will be given at this time.  History of PE/DVT -Continue anticoagulation with Eliquis.   Acute renal failure -Resolved.  Hyperkalemia -We'll be given 1 dose of Kayexalate today, recheck potassium level in a.m.   DVT prophylaxis: Eliquis Code Status: full code Family Communication: patient only Disposition Plan: likely home in 24-48 hours  Consultants:    none  Procedures:    none  Antimicrobials:    none    Subjective: feels well, much improved, shortness of breath almost at baseline  Objective: Filed Vitals:   08/18/15 2144 08/18/15 2248 08/19/15 0500 08/19/15 1255  BP: 110/64  150/64 122/69  Pulse: 72 70  70 69  Temp: 98.1 F (36.7 C)  98.3 F (36.8 C) 98 F (36.7 C)  TempSrc: Oral  Oral Oral  Resp: 16  16 16   Height:      Weight:      SpO2: 96% 96% 100% 97%   No intake or output data in the 24 hours ending 08/19/15 1733 Filed Weights   08/18/15 1206 08/18/15 1210 08/18/15 1819  Weight: 65.772 kg (145 lb) 65.772 kg (145 lb) 66.225 kg (146 lb)    Examination:   General exam: Alert, awake, oriented x 3 Respiratory system: Mild bilateral expiratory wheezing  Cardiovascular system:RRR. No murmurs, rubs, gallops. Gastrointestinal system: Abdomen is nondistended, soft and nontender. No organomegaly or masses felt. Normal bowel sounds heard. Central nervous system: Alert and oriented. No focal neurological deficits. Extremities: No C/C/E, +pedal pulses Skin: No rashes, lesions or ulcers Psychiatry: Judgement and insight appear normal. Mood & affect appropriate.     Data Reviewed: I have personally reviewed following labs and imaging studies  CBC:  Recent Labs Lab 08/18/15 1256  WBC 9.1  HGB 12.3  HCT 40.7  MCV 92.1  PLT 99991111   Basic Metabolic Panel:  Recent Labs Lab 08/18/15 1256 08/19/15 0439  NA 145 139  K 4.3 5.7*  CL 113* 112*  CO2 27 25  GLUCOSE 199* 185*  BUN 33* 27*  CREATININE 1.27* 0.94  CALCIUM 9.5 8.8*   GFR: Estimated Creatinine Clearance: 48.5 mL/min (by C-G formula based on Cr of 0.94). Liver Function Tests: No results for input(s): AST, ALT, ALKPHOS, BILITOT, PROT, ALBUMIN in the last 168 hours.  No results for input(s): LIPASE, AMYLASE in the last 168 hours. No results for input(s): AMMONIA in the last 168 hours. Coagulation Profile: No results for input(s): INR, PROTIME in the last 168 hours. Cardiac Enzymes:  Recent Labs Lab 08/18/15 1256  TROPONINI 0.04*   BNP (last 3 results) No results for input(s): PROBNP in the last 8760 hours. HbA1C: No results for input(s): HGBA1C in the last 72 hours. CBG:  Recent Labs Lab 08/18/15 1209  08/18/15 2050 08/19/15 0723 08/19/15 1129  GLUCAP 202* 244* 184* 254*   Lipid Profile: No results for input(s): CHOL, HDL, LDLCALC, TRIG, CHOLHDL, LDLDIRECT in the last 72 hours. Thyroid Function Tests: No results for input(s): TSH, T4TOTAL, FREET4, T3FREE, THYROIDAB in the last 72 hours. Anemia Panel: No results for input(s): VITAMINB12, FOLATE, FERRITIN, TIBC, IRON, RETICCTPCT in the last 72 hours. Urine analysis:    Component Value Date/Time   COLORURINE YELLOW 07/24/2015 2020   APPEARANCEUR CLEAR 07/24/2015 2020   LABSPEC 1.020 07/24/2015 2020   PHURINE 5.5 07/24/2015 2020   GLUCOSEU NEGATIVE 07/24/2015 2020   HGBUR NEGATIVE 07/24/2015 2020   BILIRUBINUR NEGATIVE 07/24/2015 2020   KETONESUR NEGATIVE 07/24/2015 2020   PROTEINUR NEGATIVE 07/24/2015 2020   UROBILINOGEN 0.2 08/25/2014 1919   NITRITE NEGATIVE 07/24/2015 2020   LEUKOCYTESUR NEGATIVE 07/24/2015 2020   Sepsis Labs: @LABRCNTIP (procalcitonin:4,lacticidven:4)  ) Recent Results (from the past 240 hour(s))  Culture, blood (routine x 2)     Status: None (Preliminary result)   Collection Time: 08/18/15  2:18 PM  Result Value Ref Range Status   Specimen Description BLOOD RIGHT ARM DRAWN BY RN  Final   Special Requests BOTTLES DRAWN AEROBIC AND ANAEROBIC 5CC EACH  Final   Culture NO GROWTH < 24 HOURS  Final   Report Status PENDING  Incomplete  Culture, blood (routine x 2)     Status: None (Preliminary result)   Collection Time: 08/18/15  2:25 PM  Result Value Ref Range Status   Specimen Description BLOOD LEFT HAND  Final   Special Requests BOTTLES DRAWN AEROBIC AND ANAEROBIC 10CC EACH  Final   Culture NO GROWTH < 24 HOURS  Final   Report Status PENDING  Incomplete         Radiology Studies: Dg Chest Portable 1 View  08/18/2015  CLINICAL DATA:  Shortness of breath starting this morning EXAM: PORTABLE CHEST 1 VIEW COMPARISON:  07/25/2015 FINDINGS: Borderline cardiomegaly. 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  There is atelectasis or infiltrate in lingula and left base. No pulmonary edema. IMPRESSION: Atelectasis or infiltrate in lingula and left base. No pulmonary edema. 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 Electronically Signed   By: Lahoma Crocker M.D.   On: 08/18/2015 12:47        Scheduled Meds: . apixaban  5 mg Oral BID  . guaiFENesin  600 mg Oral BID  . insulin aspart  0-9 Units Subcutaneous TID WC  . methylPREDNISolone (SOLU-MEDROL) injection  60 mg Intravenous Q6H  . sodium polystyrene  15 g Oral Once   Continuous Infusions:    LOS: 0 days    Time spent: 30 minutes. Greater than 50% of this time was spent in direct contact with the patient coordinating care.     Lelon Frohlich, MD [facility] Pager [phone]  If 7PM-7AM, please contact night-coverage [email or web address] Password [facility] 08/19/2015, 5:33 PM

## 2015-08-19 NOTE — Clinical Social Work Note (Signed)
Clinical Social Work Assessment  Patient Details  Name: [name] MRN: [id number] Date of Birth: Aug 17, 1945  Date of referral:  08/19/15               Reason for consult:  Other (Comment Required) (COPD gold)                Permission sought to share information with:    Permission granted to share information::     Name::        Agency::     Relationship::     Contact Information:     Housing/Transportation Living arrangements for the past 2 months:  Single Family Home Source of Information:  Patient, Other (Comment Required) (family friend/paid caregiver) Patient Interpreter Needed:  None Criminal Activity/Legal Involvement Pertinent to Current Situation/Hospitalization:  No - Comment as needed Significant Relationships:  Other(Comment) (caregiver) Lives with:  Self Do you feel safe going back to the place where you live?  Yes Need for family participation in patient care:  Yes (Comment)  Care giving concerns:  None reported by pt. Pt has had 3 admissions in past 6 months.    Social Worker assessment / plan:  CSW met with pt at bedside along with pt's friend/paid caregiver, [name]. Pt indicates that [name] has been staying with her for the past 3 weeks around the clock and assisting with her care. CSW referred due to COPD gold protocol. Pt is on 2 liters of oxygen at home and states she takes her medications as prescribed. She feels that she is managing fine at home now with [name] there. Pt states she has [employer] and all equipment needed at home. CSW attempted to complete anxiety/depression screenings, but pt became somewhat agitated by questions and states she does not have anxiety or depression and did not want to complete. CSW will sign off, but can be reconsulted if needed.   Employment status:  Retired Forensic scientist:  Medicare PT Recommendations:  Not assessed at this time [place] / Referral to community resources:  Other (Comment Required) (pt denies any  needs)  Patient/Family's Response to care:  Pt plans to return home at d/c.   Patient/Family's Understanding of and Emotional Response to Diagnosis, Current Treatment, and Prognosis:  Pt indicates understanding of admission diagnosis and reason for CSW consult. However, she reports no needs.   Emotional Assessment Appearance:  Appears stated age Attitude/Demeanor/Rapport:  Guarded Affect (typically observed):  Agitated Orientation:  Oriented to Self, Oriented to Place, Oriented to  Time, Oriented to Situation Alcohol / Substance use:  Not Applicable Psych involvement (Current and /or in the community):  No (Comment)  Discharge Needs  Concerns to be addressed:  No discharge needs identified Readmission within the last 30 days:  Yes Current discharge risk:  Chronically ill Barriers to Discharge:  Continued Medical Work up   [employer], [employer], [place] 08/19/2015, 11:11 AM [phone]

## 2015-08-19 NOTE — Progress Notes (Signed)
Inpatient Diabetes Program Recommendations  AACE/ADA: New Consensus Statement on Inpatient Glycemic Control (2015)  Target Ranges:  Prepandial:   less than 140 mg/dL      Peak postprandial:   less than 180 mg/dL (1-2 hours)      Critically ill patients:  140 - 180 mg/dL  Results for MANALI, BODOR (MRN [id number]) as of 08/19/2015 12:06  Ref. Range 08/18/2015 12:09 08/18/2015 20:50 08/19/2015 07:23 08/19/2015 11:29  Glucose-Capillary Latest Ref Range: 65-99 mg/dL 202 (H) 244 (H) 184 (H) 254 (H)   Review of Glycemic Control  Current orders for Inpatient glycemic control: Novolog 0-9 units TID with meals  Inpatient Diabetes Program Recommendations: Correction (SSI): Please consider increasing Novolog correction to moderate scale and adding Novolog bedtime correction scale.  Thanks, Barnie Alderman, RN, MSN, CDE Diabetes Coordinator Inpatient Diabetes Program [phone] (Team Pager from [place] to [place]) [phone] ([place] office) [phone] [facility] office) [phone] [facility] office)

## 2015-08-19 NOTE — Progress Notes (Signed)
OT Cancellation Note  Patient Details Name: [name] MRN: [id number] DOB: 01/08/1946   Cancelled Treatment:     Reason evaluation not completed: Chart reviewed, evaluation attempted, pt refused OT evaluation reporting she doesn't need any therapy besides her current [facility] services. Explained potential benefit of HHOT services in improving independence and safety during ADL tasks, pt adamantly states she "doesn't want or need any services because I have [name]", a 24/7 caregiver who assists with all ADL and mobility tasks. Pt then requested OT to leave. OT will sign off.   Guadelupe Sabin, OTR/L  [phone]  08/19/2015, 9:01 AM

## 2015-08-19 NOTE — Progress Notes (Signed)
PT Cancellation Note  Patient Details Name: [name] MRN: [id number] DOB: 1945/11/12   Cancelled Treatment:    Reason Eval/Treat Not Completed: Other (comment) Pt refused again stating that she is again soiled, and she is waiting for [name], her aid to return with her pajamas, and her brief.  Pt stated "I don't know where she is, she's been gone awhile, and she was supposed to be bringing me my clothes.  She has my purse and everything." Will check back as schedule allows.    Beth Krystofer Hevener, PT, DPT [id number]   08/19/2015, 2:23 PM

## 2015-08-19 NOTE — Progress Notes (Signed)
PT Cancellation Note  Patient Details Name: KYMESHA ACHORN MRN: [id number] DOB: 1946-01-26   Cancelled Treatment:    Reason Eval/Treat Not Completed: Other (comment)  Attempted to perform initial PT evaluation, however pt refused.  PT educated pt on importance of mobility in the hospital, and pt expressed that she has all she needs at home, and has an aide that stays with her 24/7.  Pt also expressed that she was soiled, and PT offered to assist her with hygiene, and pt refused, stating that she wasn't going to do it until [place] came with her pajama bottoms and undergarments.  Will attempt back as schedule allows.     Beth Aviance [place], PT, DPT X: [phone]   08/19/2015, 11:15 AM

## 2015-08-20 LAB — CBC
HEMATOCRIT: 36.2 % (ref 36.0–46.0)
HEMOGLOBIN: 11.1 g/dL — AB (ref 12.0–15.0)
MCH: 27.8 pg (ref 26.0–34.0)
MCHC: 30.7 g/dL (ref 30.0–36.0)
MCV: 90.5 fL (ref 78.0–100.0)
Platelets: 174 10*3/uL (ref 150–400)
RBC: 4 MIL/uL (ref 3.87–5.11)
RDW: 22 % — ABNORMAL HIGH (ref 11.5–15.5)
WBC: 10 10*3/uL (ref 4.0–10.5)

## 2015-08-20 LAB — PROCALCITONIN

## 2015-08-20 LAB — GLUCOSE, CAPILLARY
GLUCOSE-CAPILLARY: 198 mg/dL — AB (ref 65–99)
GLUCOSE-CAPILLARY: 240 mg/dL — AB (ref 65–99)

## 2015-08-20 LAB — BASIC METABOLIC PANEL
ANION GAP: 3 — AB (ref 5–15)
BUN: 24 mg/dL — ABNORMAL HIGH (ref 6–20)
CALCIUM: 8.8 mg/dL — AB (ref 8.9–10.3)
CO2: 27 mmol/L (ref 22–32)
Chloride: 110 mmol/L (ref 101–111)
Creatinine, Ser: 0.69 mg/dL (ref 0.44–1.00)
GFR calc non Af Amer: >60 mL/min (ref >60)
GLUCOSE: 186 mg/dL — AB (ref 65–99)
POTASSIUM: 4.2 mmol/L (ref 3.5–5.1)
Sodium: 140 mmol/L (ref 135–145)

## 2015-08-20 LAB — HIV ANTIBODY (ROUTINE TESTING W REFLEX): HIV Screen 4th Generation wRfx: NONREACTIVE

## 2015-08-20 MED ORDER — PREDNISONE 10 MG PO TABS: 10.0000 mg | ORAL_TABLET | Freq: Every day | ORAL | Status: AC

## 2015-08-20 NOTE — Consult Note (Signed)
   [facility] Inpatient Consult   08/20/2015  [facility] [employer] [birth year] [id number]  Patient screened [facility] care management services, however this patient is not eligible for [facility] services due to primary care provider, Dr. Karie Kirks, is no longer a [facility] provider.  Royetta Crochet. Laymond Purser, RN, BSN, [place] [facility] Liaison [phone]

## 2015-08-20 NOTE — Progress Notes (Signed)
Patient alert and oriented, independent, VSS, pt. Tolerating diet well. No complaints of pain or nausea. Pt. Had IV removed tip intact. Pt. Had prescriptions given. Pt. Voiced understanding of discharge instructions with no further questions. Pt. Discharged via wheelchair with auxilliary.  

## 2015-08-20 NOTE — Care Management Note (Signed)
Case Management Note  Patient Details  Name: ENCARNACION BURRI MRN: [id number] Date of Birth: 03-21-1946  Subjective/Objective:                  Pt is from home with resp failure. Pt lived alone prior to admission. Pt active with [facility] and has home and neb from [facility]. Pt plans to discharge home to daughters house and wishes to no longer receive [facility] services. Pt encouraged to keep [facility] services at her daughters home but pt is addement that he daughter can care for her and she no longer needs services. Salli Real, of  [facility] made aware. Pt needs port O2 tank for DC home, [facility], of [facility], made aware and delivered port tank to room.    Action/Plan: Pt discharging home today. No CM needs a this time.   Expected Discharge Date:     08/20/2015             Expected Discharge Plan:  Home/Self Care  In-House Referral:  NA  Discharge planning Services  CM Consult  Post Acute Care Choice:  NA Choice offered to:  NA  DME Arranged:    DME Agency:     HH Arranged:    HH Agency:     Status of Service:  Completed, signed off  Medicare Important Message Given:    Date Medicare IM Given:    Medicare IM give by:    Date Additional Medicare IM Given:    Additional Medicare Important Message give by:     If discussed at [place] of Stay Meetings, dates discussed:    Additional Comments:  Sherald Barge, RN 08/20/2015, 2:20 PM

## 2015-08-20 NOTE — Evaluation (Signed)
Physical Therapy Evaluation Patient Details Name: [name] MRN: [id number] DOB: September 18, 1945 Today's Date: 08/20/2015   History of Present Illness  70 yo F admitted with COPD exacerbation, and recent admission with PE, R LE DVT and PNA.  PMH: DM, sick sinus syndrom, PVD, pacemaker, COPD, DDD, femoral artery stent.   Clinical Impression  Pt received in bed, and was agreeable to PT evaluation.  Pt lives in a 1 level home and has a friend, [name], that stays with her 24/7.  She uses a walker and a w/c for mobility and has been receiving HHPT.  Today, pt ambulated 67ft with RW and Min guard.  She would benefit from continuation of HHPT at time of d/c to continue to progress her strength and endurance.     Follow Up Recommendations Home health PT;Supervision/Assistance - 24 hour    Equipment Recommendations  None recommended by PT    Recommendations for Other Services       Precautions / Restrictions Precautions Precautions: Fall Precaution Comments: multiple recent hospitalizations, and multiple falls in the last 6 months Restrictions Weight Bearing Restrictions: No      Mobility  Bed Mobility Overal bed mobility: Needs Assistance Bed Mobility: Supine to Sit     Supine to sit: HOB elevated;Supervision        Transfers Overall transfer level: Needs assistance Equipment used: Rolling walker (2 wheeled) Transfers: Sit to/from Stand Sit to Stand: Min guard Stand pivot transfers: Min guard          Ambulation/Gait Ambulation/Gait assistance: Min guard Ambulation Distance (Feet): 30 Feet Assistive device: Rolling walker (2 wheeled) Gait Pattern/deviations: Step-through pattern;Trunk flexed;Decreased stride length        Stairs            Wheelchair Mobility    Modified Rankin (Stroke Patients Only)       Balance   Sitting-balance support: Bilateral upper extremity supported Sitting balance-Leahy Scale: Fair     Standing balance support:  Bilateral upper extremity supported Standing balance-Leahy Scale: Fair                               Pertinent Vitals/Pain Pain Location: chronic back pain - does not rate Pain Descriptors / Indicators: Aching Pain Intervention(s): Limited activity within patient's tolerance    Home Living Family/patient expects to be discharged to:: Private residence   Available Help at Discharge: Available 24 hours/day Type of Home: Mobile home Home Access: Ramped entrance     Home Layout: One level Home Equipment: [place] - 2 wheels;Bedside commode;Shower seat;Wheelchair - manual      Prior Function Level of Independence: Needs assistance   Gait / Transfers Assistance Needed: Pt states that she has been walking from her bedroom down toward the kitchen and back.    ADL's / Homemaking Assistance Needed: Pt has been requireing increased assistance for dressing and bathing, and receives [facility] services including PT.  Pt has a friend that stays with her 24/7 - [name], who helps her.  Comments: Pt states she uses a RW in the house, and usually uses the w/c in the community.  Her son will drive to run errands.      Hand Dominance        Extremity/Trunk Assessment   Upper Extremity Assessment: Generalized weakness           Lower Extremity Assessment: Generalized weakness      Cervical / Trunk Assessment: Kyphotic  Communication   Communication: No difficulties  Cognition Arousal/Alertness: Awake/alert Behavior During Therapy: WFL for tasks assessed/performed Overall Cognitive Status: Within Functional Limits for tasks assessed                      General Comments      Exercises        Assessment/Plan    PT Assessment Patient needs continued PT services  PT Diagnosis Difficulty walking   PT Problem List Decreased strength;Decreased range of motion;Decreased activity tolerance;Decreased balance;Decreased mobility;Decreased safety awareness;Decreased  knowledge of precautions;Cardiopulmonary status limiting activity;Pain  PT Treatment Interventions DME instruction;Gait training;Functional mobility training;Therapeutic activities;Therapeutic exercise;Balance training;Patient/family education   PT Goals (Current goals can be found in the Care Plan section) Acute Rehab PT Goals Patient Stated Goal: Pt expressed that she wants to get home and stronger.  PT Goal Formulation: With patient Time For Goal Achievement: 08/27/15 Potential to Achieve Goals: Fair    Frequency Min 3X/week   Barriers to discharge        Co-evaluation               End of Session Equipment Utilized During Treatment: Gait belt;Oxygen Activity Tolerance: Patient limited by pain Patient left: in bed Nurse Communication: Mobility status    Functional Assessment Tool Used: [place] "6-clicks"  Functional Limitation: Mobility: Walking and moving around Mobility: Walking and Moving Around Current Status [phone]): At least 40 percent but less than 60 percent impaired, limited or restricted Mobility: Walking and Moving Around Goal Status [phone]): At least 20 percent but less than 40 percent impaired, limited or restricted    Time: 1019-1031 PT Time Calculation (min) (ACUTE ONLY): 12 min   Charges:   PT Evaluation $PT Eval Low Complexity: 1 Procedure     PT G Codes:   PT G-Codes **NOT FOR INPATIENT CLASS** Functional Assessment Tool Used: [employer] "6-clicks"  Functional Limitation: Mobility: Walking and moving around Mobility: Walking and Moving Around Current Status [phone]): At least 40 percent but less than 60 percent impaired, limited or restricted Mobility: Walking and Moving Around Goal Status [phone]): At least 20 percent but less than 40 percent impaired, limited or restricted    Tacy Learn, PT, DPT X: [id number]   08/20/2015, 12:36 PM

## 2015-08-20 NOTE — Discharge Summary (Signed)
Physician Discharge Summary  [name] [id number] DOB: 1946/03/15 DOA: 08/18/2015  PCP: Melinda Bellow, MD  Admit date: 08/18/2015 Discharge date: 08/20/2015  Time spent: 45 minutes  Recommendations for Outpatient Follow-up:  -we'll be discharged home today. -Advised to follow-up with primary care provider in 2 weeks.   Discharge Diagnoses:  Principal Problem:   Acute on chronic respiratory failure with hypoxia and hypercapnia (HCC) Active Problems:   Type 2 diabetes mellitus (HCC)   COPD (chronic obstructive pulmonary disease) (HCC)   Chronic anticoagulation   AKI (acute kidney injury) ([place])   COPD with acute exacerbation (HCC)   PAF (paroxysmal atrial fibrillation) (HCC)   HCAP (healthcare-associated pneumonia)   Chronic pain syndrome   OSA (obstructive sleep apnea)   Hypoxia   Hyperglycemia   Malnutrition of moderate degree   Discharge Condition: stable and improved  Filed Weights   08/18/15 1206 08/18/15 1210 08/18/15 1819  Weight: 65.772 kg (145 lb) 65.772 kg (145 lb) 66.225 kg (146 lb)    History of present illness:  As per Dr. Myna [name] on 5/16: [name] is a 70 y.o. female with medical history significant for COPD with supplemental O2 requirement at baseline, OSA on CPAP, chronic low back pain on narcotic, sick sinus syndrome with pacer, and recent diagnosis of PE and DVT on Eliquis presents to the ED with progressive dyspnea despite her use of nebulized breathing treatments and supplemental oxygen at home. Patient was admitted to the hospital from 07/24/2015 to 07/28/2015 with dyspnea and was found to have pulmonary embolism and DVT. She ended up being discharged home in improved and stable condition on request. She reports making a full return to her baseline respiratory status prior to the insidious onset of her current symptoms. She denies any fevers, chills, chest pain, or palpitations. She denies any lower extremity edema or orthopnea. She has  been using her home nebulizer unit with increasing frequency but her condition continues to worsen, marked by dyspnea on minimal exertion. EMS was activated, found patient to be saturating in 80s on 2 L/m nasal canula, and gave a neb treatment en route.   ED Course: Upon arrival to the ED, patient is found to be afebrile, saturating 92% on 3 L/m supplemental oxygen, and with vital signs otherwise stable. EKG features a paced rhythm. Chest x-ray is notable for opacity in the lingula and left base which could represent atelectasis or an infectious infiltrate. Chemistry panel is notable for BUN of 33 and serum creatinine 1.27, up from 0.841 month prior. CBC is normal, lactate is elevated to 2.66, and troponin is mildly elevated to 0.04. Blood cultures were obtained, duo nebs and prednisone given, and the patient was treated empirically with vancomycin and Zosyn. She was given a dose of oral prednisone 60 mg. Patient remained hemodynamically stable in the emergency department and will be admitted to the hospital for ongoing evaluation and management of COPD with exacerbation and acute on chronic respiratory failure, possibly secondary to HCAP.   Hospital Course:   COPD with acute exacerbation/acute on chronic hypoxemic respiratory failure -clinically improved, patient anxious to be discharged home today. -Continue prednisone taper upon discharge, nebs, oxygen as required to keep sats above 90%.  -was initially thought that she could potentially have hospital-acquired pneumonia, however this has been discarded and no further antibiotics will be given at this time.  History of PE/DVT -Continue anticoagulation with Eliquis.   Acute renal failure -Resolved.  Hyperkalemia -Resolved, did receive 1 dose of  15 g of Kayexalate.  Procedures:  None   Consultations:  None  Discharge Instructions  Discharge Instructions    Increase activity slowly    Complete by:  As directed               Medication List    STOP taking these medications        aspirin 81 MG tablet     clindamycin 300 MG capsule  Commonly known as:  CLEOCIN     ibuprofen 200 MG tablet  Commonly known as:  ADVIL,MOTRIN      TAKE these medications        albuterol (2.5 MG/3ML) 0.083% nebulizer solution  Commonly known as:  PROVENTIL  Take 2.5 mg by nebulization every 6 (six) hours as needed for wheezing or shortness of breath.     apixaban 5 MG Tabs tablet  Commonly known as:  ELIQUIS  Take 1 tablet (5 mg total) by mouth 2 (two) times daily.     CENTRUM SILVER ULTRA WOMENS Tabs  Take 1 tablet by mouth daily.     guaiFENesin 600 MG 12 hr tablet  Commonly known as:  MUCINEX  Take 1 tablet (600 mg total) by mouth 2 (two) times daily.     hydrocortisone cream 1 %  Apply 1 application topically 3 (three) times daily.     oxyCODONE-acetaminophen 10-325 MG tablet  Commonly known as:  PERCOCET  Take 1 tablet by mouth 4 (four) times daily as needed for pain.     OXYGEN  Inhale 2 L into the lungs continuous.     phenylephrine-shark liver oil-mineral oil-petrolatum 0.25-3-14-71.9 % rectal ointment  Commonly known as:  PREPARATION H  Place 1 application rectally as needed for hemorrhoids.     predniSONE 10 MG tablet  Commonly known as:  DELTASONE  Take 1 tablet (10 mg total) by mouth daily with breakfast. Take 6 tablets today and then decrease by 1 tablet daily until none are left.     senna-docusate 8.6-50 MG tablet  Commonly known as:  Senokot-S  Take 2 tablets by mouth daily as needed for mild constipation.     solifenacin 5 MG tablet  Commonly known as:  VESICARE  Take 5 mg by mouth daily. Reported on 08/18/2015       Allergies  Allergen Reactions  . Prednisone Other (See Comments)    Chest pain       Follow-up Information    Follow up with Melinda Bellow, MD. Schedule an appointment as soon as possible for a visit in 2 weeks.   Specialty:  Family Medicine   Contact  information:   [place] [address] [phone]        The results of significant diagnostics from this hospitalization (including imaging, microbiology, ancillary and laboratory) are listed below for reference.    Significant Diagnostic Studies: Ct Angio Chest Pe W/cm &/or Wo Cm  07/25/2015  CLINICAL DATA:  Right lower extremity deep venous thrombosis diagnosed this morning. Clinical concern for pulmonary embolism. EXAM: CT ANGIOGRAPHY CHEST WITH CONTRAST TECHNIQUE: Multidetector CT imaging of the chest was performed using the standard protocol during bolus administration of intravenous contrast. Multiplanar CT image reconstructions and MIPs were obtained to evaluate the vascular anatomy. CONTRAST:  123mL OMNIPAQUE IOHEXOL 350 MG/ML SOLN COMPARISON:  08/02/2013 unenhanced chest CT and Chest radiograph from one day prior. FINDINGS: Mediastinum/Nodes: The study is high quality for the evaluation of pulmonary embolism. There is a curvilinear and slightly  eccentric thrombus within the lobar pulmonary artery branch of the right lower lobe. No additional pulmonary emboli. Atherosclerotic nonaneurysmal thoracic aorta. Top-normal caliber main pulmonary artery (2.9 cm diameter). Mild cardiomegaly with dilated right ventricle and right atrium, not appreciably changed from prior chest CT. No pericardial fluid/thickening. Left anterior descending, left circumflex and right coronary atherosclerosis. Two lead left subclavian pacemaker with lead tips in the right atrium and right ventricular apex. Stable coarsely calcified 1.2 cm left thyroid lobe nodule. Fluid level in the mid thoracic esophagus. No pathologically enlarged axillary, mediastinal or hilar lymph nodes. Lungs/Pleura: No pneumothorax. No pleural effusion. Secretions nearly occlude the bilateral lower lobe bronchi. Complete right middle lobe and left lower lobe consolidation and near complete right lower lobe consolidation with  significant volume loss in these lobes. Peripheral right upper lobe 10 x 8 mm sub solid pulmonary nodule (series 5/image 20), which may have increased from 6 x 5 mm on [id number] (uncertain if this represents the same nodule). Mosaic attenuation in the aerated portions of the lungs. Upper abdomen: Unremarkable. Musculoskeletal: No aggressive appearing focal osseous lesions. Mild degenerative changes in the thoracic spine. Review of the MIP images confirms the above findings. IMPRESSION: 1. Solitary lobar pulmonary embolus in the right lower lobe, which appears subacute. 2. Stable mild cardiomegaly with chronic dilatation of the right heart chambers. 3. Complete right middle lobe and left lower lobe consolidation and near complete right lower lobe consolidation with associated significant volume loss in these lobes, favor atelectasis, cannot exclude a component of aspiration or pneumonia given the near complete occlusion of the associated bronchi by secretions/mucous plugging. 4. Fluid in the thoracic esophagus, suggesting esophageal dysmotility and/or gastroesophageal reflux. 5. Right upper lobe 10 mm subsolid pulmonary nodule. Initial follow-up with CT at 6-12 months is recommended to confirm persistence. If persistent, repeat CT is recommended every 2 years until 5 years of stability has been established. This recommendation follows the consensus statement: Guidelines for Management of Incidental Pulmonary Nodules Detected on CT Images:From the Fleischner Society 2017; published online before print (10.1148/radiol.SG:5268862). 6. Three-vessel coronary atherosclerosis. Critical Value/emergent results were called by telephone at the time of interpretation on 07/25/2015 at 3:05 pm to Dr. Lelon Frohlich , who verbally acknowledged these results. Electronically Signed   By: Ilona Sorrel M.D.   On: 07/25/2015 15:06   US Venous Img Lower Bilateral  07/25/2015  CLINICAL DATA:  Hypoxemia.  History of PE. EXAM:  BILATERAL LOWER EXTREMITY VENOUS DOPPLER ULTRASOUND TECHNIQUE: Gray-scale sonography with graded compression, as well as color Doppler and duplex ultrasound were performed to evaluate the lower extremity deep venous systems from the level of the common femoral vein and including the common femoral, femoral, profunda femoral, popliteal and calf veins including the posterior tibial, peroneal and gastrocnemius veins when visible. The superficial great saphenous vein was also interrogated. Spectral Doppler was utilized to evaluate flow at rest and with distal augmentation maneuvers in the common femoral, femoral and popliteal veins. COMPARISON:  Bilateral venous ultrasound, 07/09/2015 FINDINGS: RIGHT LOWER EXTREMITY Common Femoral Vein: No evidence of thrombus. Normal compressibility, respiratory phasicity and response to augmentation. Saphenofemoral Junction: No evidence of thrombus. Normal compressibility and flow on color Doppler imaging. Profunda Femoral Vein: No evidence of thrombus. Normal compressibility and flow on color Doppler imaging. Femoral Vein: No evidence of thrombus. Normal compressibility, respiratory phasicity and response to augmentation. Popliteal Vein: There is noncompressible acute thrombus in the popliteal vein call partially occlusive. Calf Veins: Veins not visualized.  Additional thrombus suspected.  Superficial Great Saphenous Vein: No evidence of thrombus. Normal compressibility and flow on color Doppler imaging. Venous Reflux:  None. Other Findings:  None. LEFT LOWER EXTREMITY Common Femoral Vein: No evidence of thrombus. Normal compressibility, respiratory phasicity and response to augmentation. Saphenofemoral Junction: No evidence of thrombus. Normal compressibility and flow on color Doppler imaging. Profunda Femoral Vein: No evidence of thrombus. Normal compressibility and flow on color Doppler imaging. Femoral Vein: No evidence of thrombus. Normal compressibility, respiratory phasicity  and response to augmentation. Popliteal Vein: No evidence of thrombus. Normal compressibility, respiratory phasicity and response to augmentation. Calf Veins: Not visualized. Superficial Great Saphenous Vein: No evidence of thrombus. Normal compressibility and flow on color Doppler imaging. Venous Reflux:  None. Other Findings:  None. IMPRESSION: 1. Acute deep venous thrombosis in the right popliteal vein with possible additional thrombus into the calf veins, which were not visualized. This has developed since the prior lower extremity venous ultrasound. 2. No evidence of a left lower extremity deep venous thrombosis. Electronically Signed   By: Lajean Manes M.D.   On: 07/25/2015 10:06   Dg Chest Portable 1 View  08/18/2015  CLINICAL DATA:  Shortness of breath starting this morning EXAM: PORTABLE CHEST 1 VIEW COMPARISON:  07/25/2015 FINDINGS: Borderline cardiomegaly. 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 There is atelectasis or infiltrate in lingula and left base. No pulmonary edema. IMPRESSION: Atelectasis or infiltrate in lingula and left base. No pulmonary edema. 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 Electronically Signed   By: Lahoma Crocker M.D.   On: 08/18/2015 12:47   Dg Chest Port 1 View  07/24/2015  CLINICAL DATA:  70 year old female with a history of shortness of breath EXAM: PORTABLE CHEST 1 VIEW COMPARISON:  07/11/2015, 07/09/2015 FINDINGS: Cardiomediastinal silhouette unchanged, with cardiomegaly. Unchanged position of left cardiac pacing device with 2 leads in place. Left basilar opacity obscuring the retrocardiac region an the left hemidiaphragm. No pneumothorax. Nonspecific opacity at the medial right base. Low lung volumes. IMPRESSION: Low lung volumes with ill-defined opacity in the retrocardiac region and at the right base. This may reflect atelectasis, although if there is concern for developing infection, a formal PA and lateral chest x-ray may be useful. Unchanged left chest wall cardiac  pacing device. Signed, Dulcy Fanny. Earleen Newport, DO [facility] Electronically Signed   By: Corrie Mckusick D.O.   On: 07/24/2015 16:31    Microbiology: Recent Results (from the past 240 hour(s))  Culture, blood (routine x 2)     Status: None (Preliminary result)   Collection Time: 08/18/15  2:18 PM  Result Value Ref Range Status   Specimen Description BLOOD RIGHT ARM DRAWN BY RN  Final   Special Requests BOTTLES DRAWN AEROBIC AND ANAEROBIC 5CC EACH  Final   Culture NO GROWTH 2 DAYS  Final   Report Status PENDING  Incomplete  Culture, blood (routine x 2)     Status: None (Preliminary result)   Collection Time: 08/18/15  2:25 PM  Result Value Ref Range Status   Specimen Description BLOOD LEFT HAND  Final   Special Requests BOTTLES DRAWN AEROBIC AND ANAEROBIC 10CC EACH  Final   Culture NO GROWTH 2 DAYS  Final   Report Status PENDING  Incomplete     Labs: Basic Metabolic Panel:  Recent Labs Lab 08/18/15 1256 08/19/15 0439 08/20/15 0535  NA 145 139 140  K 4.3 5.7* 4.2  CL 113* 112* 110  CO2 27 25 27   GLUCOSE 199* 185* 186*  BUN 33* 27* 24*  CREATININE 1.27* 0.94 0.69  CALCIUM 9.5 8.8* 8.8*   Liver Function Tests: No results for input(s): AST, ALT, ALKPHOS, BILITOT, PROT, ALBUMIN in the last 168 hours. No results for input(s): LIPASE, AMYLASE in the last 168 hours. No results for input(s): AMMONIA in the last 168 hours. CBC:  Recent Labs Lab 08/18/15 1256 08/20/15 0535  WBC 9.1 10.0  HGB 12.3 11.1*  HCT 40.7 36.2  MCV 92.1 90.5  PLT 155 174   Cardiac Enzymes:  Recent Labs Lab 08/18/15 1256  TROPONINI 0.04*   BNP: BNP (last 3 results)  Recent Labs  08/25/14 1710 07/09/15 0040  BNP 886.0* 636.0*    ProBNP (last 3 results) No results for input(s): PROBNP in the last 8760 hours.  CBG:  Recent Labs Lab 08/19/15 0723 08/19/15 1129 08/19/15 1657 08/20/15 0739 08/20/15 1129  GLUCAP 184* 254* 143*  198* 240*       Signed:  HERNANDEZ ACOSTA,Shawniece Oyola  [facility] Pager: [phone] 08/20/2015, 11:40 AM

## 2015-08-23 LAB — CULTURE, BLOOD (ROUTINE X 2)
CULTURE: NO GROWTH
CULTURE: NO GROWTH

## 2015-08-28 ENCOUNTER — Ambulatory Visit (INDEPENDENT_AMBULATORY_CARE_PROVIDER_SITE_OTHER): Payer: Medicare Other | Admitting: Internal Medicine

## 2015-08-28 ENCOUNTER — Encounter (INDEPENDENT_AMBULATORY_CARE_PROVIDER_SITE_OTHER): Payer: Self-pay | Admitting: Internal Medicine

## 2015-10-03 DEATH — deceased

## 2017-10-23 ENCOUNTER — Encounter: Payer: Self-pay | Admitting: Cardiology

## 2017-12-25 ENCOUNTER — Encounter (INDEPENDENT_AMBULATORY_CARE_PROVIDER_SITE_OTHER): Payer: Self-pay | Admitting: *Deleted

## 2018-03-26 IMAGING — NM NM PULMONARY VENT & PERF
16 series · 16 of 16 positions shown · non-contrast
Comparison: None

Correlation:  Chest radiograph 07/09/2015

CLINICAL DATA: Shortness of breath increased over past few weeks,
chronic pneumonia, elevated D-dimer, history diabetes mellitus,
smoking, paroxysmal atrial fibrillation, COPD

EXAM:
NUCLEAR MEDICINE VENTILATION - PERFUSION LUNG SCAN
TECHNIQUE: Ventilation images were obtained in multiple projections using
inhaled aerosol Nc-SSm DTPA. Perfusion images were obtained in
multiple projections after intravenous injection of Nc-SSm MAA.
RADIOPHARMACEUTICALS:  30 mCi Technetium-FFm DTPA aerosol inhalation
and 3.1 mCi Technetium-FFm MAA IV

[Series 1: ant/post vent · 4.14mm/px · 1 of 1 slices shown (1 of 2)]
[im 1/1]
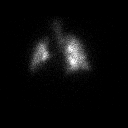

[Series 1: ant/post vent · 4.14mm/px · 1 of 1 slices shown (2 of 2)]
[im 1/1]
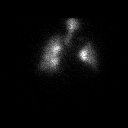

[Series 2: lao/rpo vent · 4.14mm/px · 1 of 1 slices shown (1 of 2)]
[im 1/1]
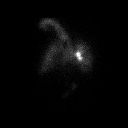

[Series 2: lao/rpo vent · 4.14mm/px · 1 of 1 slices shown (2 of 2)]
[im 1/1]
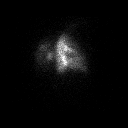

[Series 3: lt lat/rt lat vent · 4.14mm/px · 1 of 1 slices shown (1 of 2)]
[im 1/1]
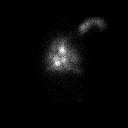

[Series 3: lt lat/rt lat vent · 4.14mm/px · 1 of 1 slices shown (2 of 2)]
[im 1/1]
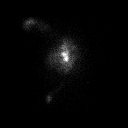

[Series 4: lpo/rao vent · 4.14mm/px · 1 of 1 slices shown (1 of 2)]
[im 1/1]
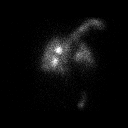

[Series 4: lpo/rao vent · 4.14mm/px · 1 of 1 slices shown (2 of 2)]
[im 1/1]
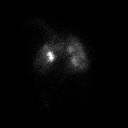

[Series 5: ant/post perf · 4.14mm/px · 1 of 1 slices shown (1 of 2)]
[im 1/1]
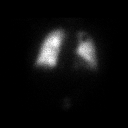

[Series 5: ant/post perf · 4.14mm/px · 1 of 1 slices shown (2 of 2)]
[im 1/1]
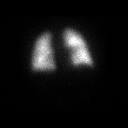

[Series 6: lao/rpo perf · 4.14mm/px · 1 of 1 slices shown (1 of 2)]
[im 1/1]
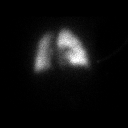

[Series 6: lao/rpo perf · 4.14mm/px · 1 of 1 slices shown (2 of 2)]
[im 1/1]
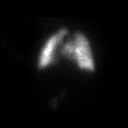

[Series 7: lt lat/rt lat perf · 4.14mm/px · 1 of 1 slices shown (1 of 2)]
[im 1/1]
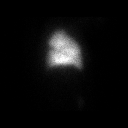

[Series 7: lt lat/rt lat perf · 4.14mm/px · 1 of 1 slices shown (2 of 2)]
[im 1/1]
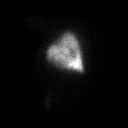

[Series 8: lpo/rao perf · 4.14mm/px · 1 of 1 slices shown (1 of 2)]
[im 1/1]
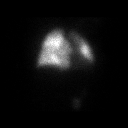

[Series 8: lpo/rao perf · 4.14mm/px · 1 of 1 slices shown (2 of 2)]
[im 1/1]
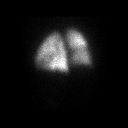

[16 of 16 positions shown; findings below may reference images not displayed]

FINDINGS: Ventilation: Central airway and oropharyngeal deposition of tracer.
Swallowed aerosol within stomach. Enlargement of cardiac silhouette.
No other ventilatory abnormalities.

Perfusion: Photopenic defect LEFT apex due to pacemaker generator.
Moderate perfusion defect medial basal segment RIGHT lower lobe best
appreciated on RPO view, with normal ventilation at this site. No
other focal perfusion defects.

Chest radiograph demonstrates LEFT lower lobe atelectasis.

Moderate-sized perfusion defect medial basal segment RIGHT lower
lobe with normal ventilation representing an intermediate
probability for pulmonary embolism.
